# Patient Record
Sex: Female | Born: 1950 | Race: White | Hispanic: No | Marital: Married | Smoking: Former smoker
Health system: Southern US, Community
[De-identification: ages and names within clinical notes are randomized; demographics above are authoritative.]

## PROBLEM LIST (undated history)

## (undated) DIAGNOSIS — E785 Hyperlipidemia, unspecified: Secondary | ICD-10-CM

## (undated) DIAGNOSIS — I10 Essential (primary) hypertension: Secondary | ICD-10-CM

## (undated) DIAGNOSIS — N281 Cyst of kidney, acquired: Secondary | ICD-10-CM

## (undated) DIAGNOSIS — I700 Atherosclerosis of aorta: Secondary | ICD-10-CM

## (undated) DIAGNOSIS — Z9889 Other specified postprocedural states: Secondary | ICD-10-CM

## (undated) DIAGNOSIS — M545 Low back pain, unspecified: Secondary | ICD-10-CM

## (undated) DIAGNOSIS — I2510 Atherosclerotic heart disease of native coronary artery without angina pectoris: Secondary | ICD-10-CM

## (undated) DIAGNOSIS — C641 Malignant neoplasm of right kidney, except renal pelvis: Secondary | ICD-10-CM

## (undated) DIAGNOSIS — K589 Irritable bowel syndrome without diarrhea: Secondary | ICD-10-CM

## (undated) DIAGNOSIS — K469 Unspecified abdominal hernia without obstruction or gangrene: Secondary | ICD-10-CM

## (undated) DIAGNOSIS — Z87891 Personal history of nicotine dependence: Secondary | ICD-10-CM

## (undated) DIAGNOSIS — Z79891 Long term (current) use of opiate analgesic: Secondary | ICD-10-CM

## (undated) DIAGNOSIS — K4090 Unilateral inguinal hernia, without obstruction or gangrene, not specified as recurrent: Secondary | ICD-10-CM

## (undated) DIAGNOSIS — T7840XA Allergy, unspecified, initial encounter: Secondary | ICD-10-CM

## (undated) DIAGNOSIS — M419 Scoliosis, unspecified: Secondary | ICD-10-CM

## (undated) DIAGNOSIS — Z7982 Long term (current) use of aspirin: Secondary | ICD-10-CM

## (undated) DIAGNOSIS — I6523 Occlusion and stenosis of bilateral carotid arteries: Secondary | ICD-10-CM

## (undated) DIAGNOSIS — K118 Other diseases of salivary glands: Secondary | ICD-10-CM

## (undated) DIAGNOSIS — C649 Malignant neoplasm of unspecified kidney, except renal pelvis: Secondary | ICD-10-CM

## (undated) HISTORY — DX: Low back pain: M54.5

## (undated) HISTORY — DX: Unspecified abdominal hernia without obstruction or gangrene: K46.9

## (undated) HISTORY — PX: OTHER SURGICAL HISTORY: SHX169

## (undated) HISTORY — DX: Cyst of kidney, acquired: N28.1

## (undated) HISTORY — DX: Malignant neoplasm of unspecified kidney, except renal pelvis: C64.9

## (undated) HISTORY — DX: Hyperlipidemia, unspecified: E78.5

## (undated) HISTORY — DX: Low back pain, unspecified: M54.50

## (undated) HISTORY — DX: Atherosclerotic heart disease of native coronary artery without angina pectoris: I25.10

## (undated) HISTORY — PX: MULTIPLE TOOTH EXTRACTIONS: SHX2053

## (undated) HISTORY — DX: Scoliosis, unspecified: M41.9

## (undated) HISTORY — DX: Essential (primary) hypertension: I10

## (undated) HISTORY — DX: Personal history of nicotine dependence: Z87.891

## (undated) HISTORY — DX: Allergy, unspecified, initial encounter: T78.40XA

## (undated) HISTORY — PX: SHOULDER ARTHROSCOPY: SHX128

## (undated) HISTORY — PX: SEPTOPLASTY: SUR1290

## (undated) HISTORY — DX: Irritable bowel syndrome, unspecified: K58.9

---
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
## 1978-11-11 DIAGNOSIS — O009 Unspecified ectopic pregnancy without intrauterine pregnancy: Secondary | ICD-10-CM

## 1978-11-11 HISTORY — PX: SALPINGECTOMY: SHX328

## 1978-11-11 HISTORY — DX: Unspecified ectopic pregnancy without intrauterine pregnancy: O00.90

## 1979-11-12 HISTORY — PX: ABDOMINAL HYSTERECTOMY: SHX81

## 1979-11-12 HISTORY — PX: OTHER SURGICAL HISTORY: SHX169

## 1982-11-11 HISTORY — PX: OTHER SURGICAL HISTORY: SHX169

## 1982-11-11 HISTORY — PX: ABDOMINAL ADHESION SURGERY: SHX90

## 1991-11-12 HISTORY — PX: HERNIA REPAIR: SHX51

## 2007-08-18 ENCOUNTER — Ambulatory Visit: Payer: Self-pay | Admitting: Specialist

## 2007-08-20 ENCOUNTER — Ambulatory Visit: Payer: Self-pay | Admitting: Internal Medicine

## 2008-08-23 ENCOUNTER — Ambulatory Visit: Payer: Self-pay | Admitting: Internal Medicine

## 2009-08-24 ENCOUNTER — Ambulatory Visit: Payer: Self-pay | Admitting: Internal Medicine

## 2009-09-07 ENCOUNTER — Ambulatory Visit: Payer: Self-pay | Admitting: Internal Medicine

## 2010-01-10 LAB — HM PAP SMEAR: HM Pap smear: NEGATIVE

## 2010-09-20 ENCOUNTER — Ambulatory Visit: Payer: Self-pay | Admitting: Internal Medicine

## 2011-08-26 ENCOUNTER — Other Ambulatory Visit: Payer: Self-pay | Admitting: *Deleted

## 2011-08-26 NOTE — Telephone Encounter (Signed)
Received faxed refill request from pharmacy for Vicodin 5-500, #120, take one tablet by mouth 4 times daily as needed for pain,  Last fill 09/26/10. Is it okay to refill medication?

## 2011-08-26 NOTE — Telephone Encounter (Signed)
Yes ok to refill #120.  The fax I received was changed by hand to 390 tablets, which I rejected.

## 2011-08-27 ENCOUNTER — Ambulatory Visit (INDEPENDENT_AMBULATORY_CARE_PROVIDER_SITE_OTHER): Payer: Medicare Other | Admitting: *Deleted

## 2011-08-27 DIAGNOSIS — Z23 Encounter for immunization: Secondary | ICD-10-CM

## 2011-08-27 MED ORDER — HYDROCODONE-ACETAMINOPHEN 5-500 MG PO TABS: 1.0000 | ORAL_TABLET | Freq: Four times a day (QID) | ORAL | Status: DC | PRN

## 2011-08-27 NOTE — Telephone Encounter (Signed)
Called in. Patient informed

## 2011-08-29 ENCOUNTER — Telehealth: Payer: Self-pay | Admitting: Internal Medicine

## 2011-08-29 DIAGNOSIS — Z1239 Encounter for other screening for malignant neoplasm of breast: Secondary | ICD-10-CM

## 2011-08-29 NOTE — Telephone Encounter (Signed)
Patient would like screening mammo ordered, please contact patient at home or cell [phone]

## 2011-08-30 NOTE — Telephone Encounter (Signed)
Patient scheduled [facility] 09/16/11 8:30, faxed order, patient aware

## 2011-08-30 NOTE — Telephone Encounter (Signed)
Mammogram ordered in EPIC.  Please arrange

## 2011-09-13 LAB — HM MAMMOGRAPHY: HM Mammogram: NORMAL

## 2011-09-19 ENCOUNTER — Other Ambulatory Visit: Payer: Self-pay | Admitting: Internal Medicine

## 2011-09-20 ENCOUNTER — Telehealth: Payer: Self-pay | Admitting: Internal Medicine

## 2011-09-20 DIAGNOSIS — M549 Dorsalgia, unspecified: Secondary | ICD-10-CM

## 2011-09-20 NOTE — Telephone Encounter (Signed)
[phone] home [phone]  Pt called to check on her rx vicocin &  robaxon muscle relaxer (this is the one she lost in [place]. Pt stated that drug store has faxed request)  [facility] [address]

## 2011-09-20 NOTE — Telephone Encounter (Signed)
Ok to call in refill on vicodin  5/500 one tablet every 6 hours prn apin  #120/month with 3 refills.

## 2011-09-23 ENCOUNTER — Telehealth: Payer: Self-pay | Admitting: Internal Medicine

## 2011-09-23 NOTE — Telephone Encounter (Signed)
I have called in the Rx for Vicodin because I saw where Dr. Derrel Nip has approved it.  Dr. Derrel Nip is it ok to call in the Robaxin Rx and if so, what is the dose, qty, and refill.  Please advise.

## 2011-09-24 MED ORDER — METHOCARBAMOL 750 MG PO TABS: 750.0000 mg | ORAL_TABLET | Freq: Three times a day (TID) | ORAL | Status: DC

## 2011-09-24 NOTE — Telephone Encounter (Signed)
[facility]in [facility]

## 2011-09-24 NOTE — Telephone Encounter (Signed)
Patient notified

## 2011-10-04 ENCOUNTER — Ambulatory Visit (INDEPENDENT_AMBULATORY_CARE_PROVIDER_SITE_OTHER): Payer: Medicare HMO | Admitting: Internal Medicine

## 2011-10-04 ENCOUNTER — Telehealth: Payer: Self-pay | Admitting: *Deleted

## 2011-10-04 ENCOUNTER — Encounter: Payer: Self-pay | Admitting: Internal Medicine

## 2011-10-04 DIAGNOSIS — E785 Hyperlipidemia, unspecified: Secondary | ICD-10-CM

## 2011-10-04 DIAGNOSIS — Z13820 Encounter for screening for osteoporosis: Secondary | ICD-10-CM

## 2011-10-04 DIAGNOSIS — G8929 Other chronic pain: Secondary | ICD-10-CM

## 2011-10-04 DIAGNOSIS — J449 Chronic obstructive pulmonary disease, unspecified: Secondary | ICD-10-CM

## 2011-10-04 DIAGNOSIS — Z1211 Encounter for screening for malignant neoplasm of colon: Secondary | ICD-10-CM

## 2011-10-04 DIAGNOSIS — Z13220 Encounter for screening for lipoid disorders: Secondary | ICD-10-CM

## 2011-10-04 DIAGNOSIS — M549 Dorsalgia, unspecified: Secondary | ICD-10-CM

## 2011-10-04 DIAGNOSIS — R5383 Other fatigue: Secondary | ICD-10-CM

## 2011-10-04 DIAGNOSIS — Z0000 Encounter for general adult medical examination without abnormal findings: Secondary | ICD-10-CM

## 2011-10-04 DIAGNOSIS — Z1239 Encounter for other screening for malignant neoplasm of breast: Secondary | ICD-10-CM | POA: Insufficient documentation

## 2011-10-04 DIAGNOSIS — Z124 Encounter for screening for malignant neoplasm of cervix: Secondary | ICD-10-CM

## 2011-10-04 MED ORDER — METHOCARBAMOL 750 MG PO TABS: 750.0000 mg | ORAL_TABLET | Freq: Four times a day (QID) | ORAL | Status: AC

## 2011-10-04 MED ORDER — HYDROCODONE-ACETAMINOPHEN 10-500 MG PO TABS: 1.0000 | ORAL_TABLET | Freq: Four times a day (QID) | ORAL | Status: DC | PRN

## 2011-10-04 MED ORDER — HYDROCODONE-ACETAMINOPHEN 10-650 MG PO TABS: 1.0000 | ORAL_TABLET | Freq: Four times a day (QID) | ORAL | Status: DC | PRN

## 2011-10-04 MED ORDER — ALBUTEROL SULFATE HFA 108 (90 BASE) MCG/ACT IN AERS: 2.0000 | INHALATION_SPRAY | Freq: Four times a day (QID) | RESPIRATORY_TRACT | Status: DC | PRN

## 2011-10-04 MED ORDER — MELOXICAM 15 MG PO TABS: 15.0000 mg | ORAL_TABLET | Freq: Every day | ORAL | Status: DC

## 2011-10-04 NOTE — Telephone Encounter (Signed)
What labs would you like for me to draw on Monday when she comes in?

## 2011-10-04 NOTE — Assessment & Plan Note (Signed)
Normal Nov 2012.

## 2011-10-04 NOTE — Patient Instructions (Signed)
I have increased the hydrcodone in your vicodin to 10 mg ,  You should not take more than 4 daily because of the tylenol limit.  We will call  you with the bone density appt and the gastroenterology referral.   Come back at 8:10 Monday for fasting labs

## 2011-10-05 ENCOUNTER — Encounter: Payer: Self-pay | Admitting: Internal Medicine

## 2011-10-05 DIAGNOSIS — M5416 Radiculopathy, lumbar region: Secondary | ICD-10-CM | POA: Insufficient documentation

## 2011-10-05 DIAGNOSIS — M545 Low back pain: Secondary | ICD-10-CM | POA: Insufficient documentation

## 2011-10-05 DIAGNOSIS — K469 Unspecified abdominal hernia without obstruction or gangrene: Secondary | ICD-10-CM | POA: Insufficient documentation

## 2011-10-05 DIAGNOSIS — K589 Irritable bowel syndrome without diarrhea: Secondary | ICD-10-CM | POA: Insufficient documentation

## 2011-10-05 DIAGNOSIS — Z85528 Personal history of other malignant neoplasm of kidney: Secondary | ICD-10-CM | POA: Insufficient documentation

## 2011-10-05 DIAGNOSIS — M419 Scoliosis, unspecified: Secondary | ICD-10-CM | POA: Insufficient documentation

## 2011-10-05 DIAGNOSIS — J449 Chronic obstructive pulmonary disease, unspecified: Secondary | ICD-10-CM | POA: Insufficient documentation

## 2011-10-05 DIAGNOSIS — J4489 Other specified chronic obstructive pulmonary disease: Secondary | ICD-10-CM | POA: Insufficient documentation

## 2011-10-05 DIAGNOSIS — I10 Essential (primary) hypertension: Secondary | ICD-10-CM | POA: Insufficient documentation

## 2011-10-05 NOTE — Assessment & Plan Note (Addendum)
Her pain had been controlled for 20 years on darvocet, which was discontinued only because it is no longer available. She had no relief with tramadol/robaxin trial in Jan 2011 and was prescribed  vicodin instead.  Has been averaging 5 daily with inadequate pain control, no neurologic symptoms to suggest spinal stenosis.  Discussed her need to limit tylenol to 2 grams daily.  Would like to avoid NSAIDS given her history of nephrectomy.. Will change vicodin to 10/500 and allow maximum of 4 daily

## 2011-10-05 NOTE — Progress Notes (Signed)
  Subjective:     Suzanne Cole is a 60 y.o. female and is here for a comprehensive physical exam. The patient reports problems - chronic back pain not controlled with current medication regimen.  History   Social History  . Marital Status: Married    Spouse Name: N/A    Number of Children: N/A  . Years of Education: N/A   Occupational History  . Not on file.   Social History Main Topics  . Smoking status: Never Smoker   . Smokeless tobacco: Never Used  . Alcohol Use: Yes  . Drug Use: No  . Sexually Active: Not on file   Other Topics Concern  . Not on file   Social History Narrative  . No narrative on file   Health Maintenance  Topic Date Due  . Pap Smear  08/17/1969  . Tetanus/tdap  08/17/1970  . Mammogram  08/17/2001  . Colonoscopy  08/17/2001  . Zostavax  08/18/2011  . Influenza Vaccine  08/11/2012    The following portions of the patient's history were reviewed and updated as appropriate: allergies, current medications, past family history, past medical history, past social history, past surgical history and problem list.  Review of Systems A comprehensive review of systems was negative.   Objective:    BP 132/70  Pulse 65  Temp(Src) 98.2 F (36.8 C) (Oral)  Resp 16  Ht 4\' 11"  (1.499 m)  Wt 129 lb 12 oz (58.854 kg)  BMI 26.21 kg/m2  SpO2 98% General appearance: alert, cooperative and appears stated age Head: Normocephalic, without obvious abnormality, atraumatic Ears: normal TM's and external ear canals both ears Throat: lips, mucosa, and tongue normal; teeth and gums normal Neck: no adenopathy, no carotid bruit, no JVD, supple, symmetrical, trachea midline and thyroid not enlarged, symmetric, no tenderness/mass/nodules Back: severe scoliosis, surgical scars present Lungs: clear to auscultation bilaterally Breasts: normal appearance, no masses or tenderness Heart: regular rate and rhythm, S1, S2 normal, no murmur, click, rub or gallop Abdomen: soft,  non-tender; bowel sounds normal; no masses,  no organomegaly Extremities: extremities normal, atraumatic, no cyanosis or edema Pulses: 2+ and symmetric Skin: Skin color, texture, turgor normal. No rashes or lesions Lymph nodes: Cervical, supraclavicular, and axillary nodes normal. Neurologic: Alert and oriented X 3, normal strength and tone. Normal symmetric reflexes. Normal coordination and gait    Assessment:    Healthy female exam, with chronic spinal deformities     Plan:     See After Visit Summary for Counseling Recommendations

## 2011-10-05 NOTE — Assessment & Plan Note (Signed)
She is s/p hysterectomy, with normal PAP  March 2011.  Deferred today.

## 2011-10-07 ENCOUNTER — Other Ambulatory Visit (INDEPENDENT_AMBULATORY_CARE_PROVIDER_SITE_OTHER): Payer: Medicare HMO | Admitting: *Deleted

## 2011-10-07 ENCOUNTER — Encounter: Payer: Self-pay | Admitting: *Deleted

## 2011-10-07 DIAGNOSIS — Z1211 Encounter for screening for malignant neoplasm of colon: Secondary | ICD-10-CM

## 2011-10-07 DIAGNOSIS — R5383 Other fatigue: Secondary | ICD-10-CM

## 2011-10-07 DIAGNOSIS — E785 Hyperlipidemia, unspecified: Secondary | ICD-10-CM

## 2011-10-07 DIAGNOSIS — R5381 Other malaise: Secondary | ICD-10-CM

## 2011-10-07 LAB — LDL CHOLESTEROL, DIRECT: Direct LDL: 128.2 mg/dL

## 2011-10-07 LAB — COMPREHENSIVE METABOLIC PANEL
ALT: 20 U/L (ref 0–35)
AST: 19 U/L (ref 0–37)
Albumin: 4.1 g/dL (ref 3.5–5.2)
CO2: 24 mEq/L (ref 19–32)
Calcium: 9.5 mg/dL (ref 8.4–10.5)
Chloride: 108 mEq/L (ref 96–112)
GFR: 62.23 mL/min (ref >60.00)
Potassium: 4.7 mEq/L (ref 3.5–5.1)
Total Protein: 7.8 g/dL (ref 6.0–8.3)

## 2011-10-07 LAB — CBC WITH DIFFERENTIAL/PLATELET
Basophils Relative: 0.9 % (ref 0.0–3.0)
Eosinophils Relative: 2.4 % (ref 0.0–5.0)
Lymphocytes Relative: 27.9 % (ref 12.0–46.0)
Monocytes Absolute: 0.4 10*3/uL (ref 0.1–1.0)
Neutrophils Relative %: 63.3 % (ref 43.0–77.0)
Platelets: 299 10*3/uL (ref 150.0–400.0)
RBC: 4.41 Mil/uL (ref 3.87–5.11)
WBC: 6.6 10*3/uL (ref 4.5–10.5)

## 2011-10-07 LAB — LIPID PANEL
Total CHOL/HDL Ratio: 5
VLDL: 51.6 mg/dL — ABNORMAL HIGH (ref 0.0–40.0)

## 2011-10-07 LAB — TSH: TSH: 0.8 u[IU]/mL (ref 0.35–5.50)

## 2011-10-08 NOTE — Progress Notes (Signed)
Addended by: Deborra Medina on: 10/08/2011 12:03 PM   Modules accepted: Orders

## 2011-10-09 ENCOUNTER — Encounter: Payer: Self-pay | Admitting: Internal Medicine

## 2011-10-21 ENCOUNTER — Encounter: Payer: Self-pay | Admitting: *Deleted

## 2011-10-24 ENCOUNTER — Other Ambulatory Visit: Payer: Medicare HMO

## 2011-10-24 DIAGNOSIS — Z1211 Encounter for screening for malignant neoplasm of colon: Secondary | ICD-10-CM

## 2011-10-24 LAB — FECAL OCCULT BLOOD, IMMUNOCHEMICAL: Fecal Occult Bld: NEGATIVE

## 2011-10-30 ENCOUNTER — Telehealth: Payer: Self-pay | Admitting: Internal Medicine

## 2011-10-30 MED ORDER — OMEPRAZOLE 20 MG PO CPDR: 20.0000 mg | DELAYED_RELEASE_CAPSULE | Freq: Every day | ORAL | Status: DC

## 2011-10-30 NOTE — Telephone Encounter (Signed)
[id number] PT CALLED TO SEE IF YOU RECEIVED RX FROM [facility] PRILOSEC (GENERIC)   [facility] [phone] FAX SHE WOULD LIKE TO GET THIS BEFORE THE END OF THE YEAR

## 2011-10-30 NOTE — Telephone Encounter (Signed)
Refill authorized.  Epic updated

## 2011-10-30 NOTE — Telephone Encounter (Signed)
Ok to refill 

## 2011-11-01 ENCOUNTER — Telehealth: Payer: Self-pay | Admitting: *Deleted

## 2011-11-01 MED ORDER — OMEPRAZOLE 20 MG PO CPDR: 20.0000 mg | DELAYED_RELEASE_CAPSULE | Freq: Every day | ORAL | Status: DC

## 2011-11-01 NOTE — Telephone Encounter (Signed)
Patient is calling because her prescription was to go to [facility] not [facility].  Rx sent. Patient is aware.

## 2011-11-04 ENCOUNTER — Other Ambulatory Visit: Payer: Self-pay | Admitting: *Deleted

## 2011-11-04 MED ORDER — OMEPRAZOLE 20 MG PO CPDR: 20.0000 mg | DELAYED_RELEASE_CAPSULE | Freq: Every day | ORAL | Status: DC

## 2011-11-07 ENCOUNTER — Other Ambulatory Visit: Payer: Self-pay | Admitting: *Deleted

## 2011-11-07 MED ORDER — OMEPRAZOLE 40 MG PO CPDR: 40.0000 mg | DELAYED_RELEASE_CAPSULE | Freq: Every day | ORAL | Status: DC

## 2011-11-07 NOTE — Telephone Encounter (Signed)
Patient called and said omeprazole 20 mg was sent to [facility] and it should've been 40 mg. Confirmed in chart and corrected with pharmacy.

## 2011-11-07 NOTE — Telephone Encounter (Signed)
Patient called and stated her omeprazole was the wrong strength. She has been on 40 mg and 20 mg had been sent in. Confirmed in system and changed. Resent 40 mg to [facility].

## 2011-12-17 ENCOUNTER — Encounter: Payer: Self-pay | Admitting: Internal Medicine

## 2012-01-22 ENCOUNTER — Other Ambulatory Visit: Payer: Self-pay | Admitting: Internal Medicine

## 2012-01-24 ENCOUNTER — Other Ambulatory Visit: Payer: Self-pay | Admitting: Internal Medicine

## 2012-01-26 NOTE — Telephone Encounter (Signed)
Ok to refill Suzanne Cole vicodin.

## 2012-02-03 ENCOUNTER — Telehealth: Payer: Self-pay | Admitting: Internal Medicine

## 2012-02-03 MED ORDER — OMEPRAZOLE 40 MG PO CPDR: 40.0000 mg | DELAYED_RELEASE_CAPSULE | Freq: Every day | ORAL | Status: DC

## 2012-02-03 NOTE — Telephone Encounter (Signed)
[phone] Pt needs refill on omeprazole Pt would like to pick up with  Husband rx

## 2012-03-16 ENCOUNTER — Telehealth: Payer: Self-pay | Admitting: Internal Medicine
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
## 2012-03-17 NOTE — Telephone Encounter (Signed)
Yes, letter printed and patient notified.

## 2012-03-17 NOTE — Telephone Encounter (Signed)
Did you get the leltter i printed?

## 2012-05-12 ENCOUNTER — Other Ambulatory Visit: Payer: Self-pay | Admitting: Internal Medicine

## 2012-06-15 ENCOUNTER — Other Ambulatory Visit: Payer: Self-pay | Admitting: Internal Medicine

## 2012-06-15 NOTE — Telephone Encounter (Signed)
rx phoned in

## 2012-06-16 NOTE — Telephone Encounter (Signed)
DONE

## 2012-08-25 ENCOUNTER — Ambulatory Visit (INDEPENDENT_AMBULATORY_CARE_PROVIDER_SITE_OTHER): Payer: Medicare Other | Admitting: Internal Medicine

## 2012-08-25 DIAGNOSIS — Z23 Encounter for immunization: Secondary | ICD-10-CM

## 2012-08-28 ENCOUNTER — Other Ambulatory Visit: Payer: Self-pay | Admitting: Internal Medicine

## 2012-08-28 NOTE — Telephone Encounter (Signed)
Refill request for Hydrocodone Acetaminophen 5-500 mg. Ok to refill?

## 2012-08-29 NOTE — Telephone Encounter (Signed)
Refill for 30 days only,  #120  patient must be seen for an office visit (has not been seen in 11 months) prior to additional refills

## 2012-08-31 NOTE — Telephone Encounter (Signed)
Hydrocodone- Acetaminophen 5-500 mg called in to [facility] #120 0 R.

## 2012-10-05 ENCOUNTER — Other Ambulatory Visit: Payer: Self-pay | Admitting: Internal Medicine

## 2012-10-06 NOTE — Telephone Encounter (Signed)
No, she has not been seen in over a year and her last refill was given with the condition that she be seen prior to any more refills.

## 2012-10-06 NOTE — Telephone Encounter (Signed)
Left message on patient vm to call and schedule appt for follow up.

## 2012-10-07 ENCOUNTER — Telehealth: Payer: Self-pay | Admitting: Internal Medicine

## 2012-10-07 MED ORDER — HYDROCODONE-ACETAMINOPHEN 5-500 MG PO TABS: 1.0000 | ORAL_TABLET | Freq: Four times a day (QID) | ORAL | Status: DC | PRN

## 2012-10-07 NOTE — Telephone Encounter (Signed)
Error

## 2012-10-07 NOTE — Telephone Encounter (Signed)
Refill on vicodin #120 no refills authorized in epic

## 2012-10-07 NOTE — Telephone Encounter (Signed)
Pt scheduled f/u appointment in early December. She was wondering if she could have that filled.

## 2012-10-07 NOTE — Telephone Encounter (Signed)
Vicodin 5-500 mg faxed to [facility]

## 2012-10-07 NOTE — Telephone Encounter (Signed)
I talked with pt and let her know that she needs to see Dr. Derrel Nip be fore meds can be given. I scheduled her for Monday 10/12/12. She was wondering if there was anything that she could take until then ???

## 2012-10-12 ENCOUNTER — Other Ambulatory Visit: Payer: Self-pay

## 2012-10-12 ENCOUNTER — Ambulatory Visit (INDEPENDENT_AMBULATORY_CARE_PROVIDER_SITE_OTHER): Payer: Medicare Other | Admitting: Internal Medicine

## 2012-10-12 ENCOUNTER — Encounter: Payer: Self-pay | Admitting: Internal Medicine

## 2012-10-12 VITALS — BP 138/72 | HR 62 | Temp 97.6°F | Resp 12 | Ht <= 58 in | Wt 125.0 lb

## 2012-10-12 DIAGNOSIS — Z1211 Encounter for screening for malignant neoplasm of colon: Secondary | ICD-10-CM

## 2012-10-12 DIAGNOSIS — R590 Localized enlarged lymph nodes: Secondary | ICD-10-CM | POA: Insufficient documentation

## 2012-10-12 DIAGNOSIS — J309 Allergic rhinitis, unspecified: Secondary | ICD-10-CM

## 2012-10-12 DIAGNOSIS — Z79899 Other long term (current) drug therapy: Secondary | ICD-10-CM

## 2012-10-12 DIAGNOSIS — R599 Enlarged lymph nodes, unspecified: Secondary | ICD-10-CM

## 2012-10-12 DIAGNOSIS — Z720 Tobacco use: Secondary | ICD-10-CM

## 2012-10-12 DIAGNOSIS — M549 Dorsalgia, unspecified: Secondary | ICD-10-CM

## 2012-10-12 DIAGNOSIS — G8929 Other chronic pain: Secondary | ICD-10-CM

## 2012-10-12 DIAGNOSIS — F17200 Nicotine dependence, unspecified, uncomplicated: Secondary | ICD-10-CM

## 2012-10-12 DIAGNOSIS — Z1239 Encounter for other screening for malignant neoplasm of breast: Secondary | ICD-10-CM

## 2012-10-12 DIAGNOSIS — J449 Chronic obstructive pulmonary disease, unspecified: Secondary | ICD-10-CM

## 2012-10-12 MED ORDER — FLUTICASONE PROPIONATE 50 MCG/ACT NA SUSP: 2.0000 | Freq: Every day | NASAL | Status: DC

## 2012-10-12 MED ORDER — HYDROCODONE-ACETAMINOPHEN 5-325 MG PO TABS: 1.0000 | ORAL_TABLET | Freq: Four times a day (QID) | ORAL | Status: DC | PRN

## 2012-10-12 MED ORDER — METHOCARBAMOL 750 MG PO TABS: 750.0000 mg | ORAL_TABLET | Freq: Four times a day (QID) | ORAL | Status: DC

## 2012-10-12 NOTE — Assessment & Plan Note (Addendum)
Present for the past 2 weeks on the right. Swelling noted by husband,  Not particularly tender.  Will repeat assessment at her annual physical and if still present will need referral to ENT for biopsy given history of lifelong tobacco abuse.

## 2012-10-12 NOTE — Patient Instructions (Addendum)
Please call me if the lymph node is still swollen in a month   i am substituting flonase nasal spray for veramys because it is generic
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
## 2012-10-12 NOTE — Assessment & Plan Note (Signed)
Will refer to [facility] for capsule colonography in January

## 2012-10-12 NOTE — Assessment & Plan Note (Signed)
Mammogram due, and ordered today

## 2012-10-13 LAB — COMPREHENSIVE METABOLIC PANEL
ALT: 17 U/L (ref 0–35)
AST: 21 U/L (ref 0–37)
Alkaline Phosphatase: 59 U/L (ref 39–117)
Calcium: 9.2 mg/dL (ref 8.4–10.5)
Chloride: 105 mEq/L (ref 96–112)
Creatinine, Ser: 0.9 mg/dL (ref 0.4–1.2)
Total Bilirubin: 0.7 mg/dL (ref 0.3–1.2)

## 2012-10-14 ENCOUNTER — Encounter: Payer: Self-pay | Admitting: Internal Medicine

## 2012-10-14 ENCOUNTER — Telehealth: Payer: Self-pay | Admitting: Internal Medicine

## 2012-10-14 DIAGNOSIS — Z87891 Personal history of nicotine dependence: Secondary | ICD-10-CM

## 2012-10-14 DIAGNOSIS — R599 Enlarged lymph nodes, unspecified: Secondary | ICD-10-CM

## 2012-10-14 DIAGNOSIS — J309 Allergic rhinitis, unspecified: Secondary | ICD-10-CM | POA: Insufficient documentation

## 2012-10-14 NOTE — Assessment & Plan Note (Addendum)
Secondary tohistory of  tobacco abuse.  She quit last  year and does not have daily symptoms or  use daily inhalers.

## 2012-10-14 NOTE — Telephone Encounter (Signed)
i would like her to get a chest x ray at the [facility] office  Reason: because of her enlarged lymph node on her neck and her history of tobacco use. It has been ordered in EPIC .

## 2012-10-14 NOTE — Assessment & Plan Note (Addendum)
She has deferred epidural steroid injection offered by Dr. Marry Guan and has deferred PT> I have reminded her that in order to refill her monthly request for vicodin she has to be seen every 3 month.  She will be asked to sign a chronic narcotics contract and submit to random urine drug screens

## 2012-10-14 NOTE — Assessment & Plan Note (Signed)
Improved with veramyst sample. Trial of generic flonase

## 2012-10-14 NOTE — Telephone Encounter (Signed)
Left message on patient vm with detailed instructions advising patient to go to [facility] office to get chest xray done.

## 2012-10-16 ENCOUNTER — Ambulatory Visit (INDEPENDENT_AMBULATORY_CARE_PROVIDER_SITE_OTHER)
Admission: RE | Admit: 2012-10-16 | Discharge: 2012-10-16 | Disposition: A | Discharge status: Home / Self Care | Payer: Medicare Other | Source: Ambulatory Visit | Attending: Internal Medicine | Admitting: Internal Medicine

## 2012-10-16 DIAGNOSIS — R599 Enlarged lymph nodes, unspecified: Secondary | ICD-10-CM

## 2012-10-16 DIAGNOSIS — Z87891 Personal history of nicotine dependence: Secondary | ICD-10-CM

## 2012-10-18 ENCOUNTER — Encounter: Payer: Self-pay | Admitting: Internal Medicine

## 2012-10-18 ENCOUNTER — Telehealth: Payer: Self-pay | Admitting: Internal Medicine

## 2012-10-18 NOTE — Telephone Encounter (Signed)
DEXA scan indicates osteoporosis .  I am recommending therapy we will discuss at her next visit, .  For now she should be taking  1200 mg ofcalcium and 1000 units of Vit D3 available otc.

## 2012-10-19 ENCOUNTER — Ambulatory Visit: Payer: Medicare Other | Admitting: Internal Medicine

## 2012-10-19 NOTE — Telephone Encounter (Signed)
Left detailed message on patient vm with instructions as directed.

## 2012-11-03 ENCOUNTER — Encounter: Payer: Self-pay | Admitting: Internal Medicine

## 2012-11-05 ENCOUNTER — Encounter: Payer: Self-pay | Admitting: Internal Medicine

## 2012-11-12 ENCOUNTER — Other Ambulatory Visit: Payer: Self-pay | Admitting: Internal Medicine

## 2012-11-12 NOTE — Telephone Encounter (Signed)
Requesting refill for HYDROcodone-acetaminophen (NORCO/VICODIN) 5-325 MG per tablet  Please advise

## 2012-11-13 ENCOUNTER — Other Ambulatory Visit: Payer: Self-pay | Admitting: Internal Medicine

## 2012-11-13 MED ORDER — HYDROCODONE-ACETAMINOPHEN 5-325 MG PO TABS: 1.0000 | ORAL_TABLET | Freq: Four times a day (QID) | ORAL | Status: DC | PRN

## 2012-11-13 NOTE — Telephone Encounter (Signed)
Patient wants to know why her hydrocodone was sent back to the pharmacy  with a refusal to fill.

## 2012-11-13 NOTE — Addendum Note (Signed)
Addended by: [name] on: 11/13/2012 01:32 PM   Modules accepted: Orders

## 2012-11-13 NOTE — Telephone Encounter (Signed)
Ok to refill,  Authorized in epic 

## 2012-11-13 NOTE — Telephone Encounter (Signed)
Med refill for hydrocodone acetaminophen called to [facility] pharmacy. Left on doctor pharmacy line. Patient notified.

## 2012-11-16 ENCOUNTER — Other Ambulatory Visit: Payer: Self-pay | Admitting: Internal Medicine

## 2012-11-16 NOTE — Telephone Encounter (Signed)
Ok to refill 

## 2013-02-03 ENCOUNTER — Encounter: Payer: Medicare Other | Admitting: Internal Medicine

## 2013-05-24 ENCOUNTER — Other Ambulatory Visit: Payer: Self-pay | Admitting: *Deleted

## 2013-05-24 NOTE — Telephone Encounter (Signed)
Has not been seen since 10/2012, ok to refill?

## 2013-05-25 ENCOUNTER — Ambulatory Visit (INDEPENDENT_AMBULATORY_CARE_PROVIDER_SITE_OTHER): Payer: Medicare HMO | Admitting: Internal Medicine

## 2013-05-25 ENCOUNTER — Encounter: Payer: Self-pay | Admitting: Internal Medicine

## 2013-05-25 VITALS — BP 148/76 | HR 70 | Temp 98.0°F | Resp 16 | Ht 59.0 in | Wt 123.0 lb

## 2013-05-25 DIAGNOSIS — Z13220 Encounter for screening for lipoid disorders: Secondary | ICD-10-CM

## 2013-05-25 DIAGNOSIS — M549 Dorsalgia, unspecified: Secondary | ICD-10-CM

## 2013-05-25 DIAGNOSIS — E559 Vitamin D deficiency, unspecified: Secondary | ICD-10-CM

## 2013-05-25 DIAGNOSIS — R5381 Other malaise: Secondary | ICD-10-CM

## 2013-05-25 DIAGNOSIS — E785 Hyperlipidemia, unspecified: Secondary | ICD-10-CM

## 2013-05-25 DIAGNOSIS — Z1211 Encounter for screening for malignant neoplasm of colon: Secondary | ICD-10-CM

## 2013-05-25 DIAGNOSIS — M7989 Other specified soft tissue disorders: Secondary | ICD-10-CM | POA: Insufficient documentation

## 2013-05-25 DIAGNOSIS — Z1159 Encounter for screening for other viral diseases: Secondary | ICD-10-CM

## 2013-05-25 DIAGNOSIS — R5383 Other fatigue: Secondary | ICD-10-CM

## 2013-05-25 DIAGNOSIS — L989 Disorder of the skin and subcutaneous tissue, unspecified: Secondary | ICD-10-CM

## 2013-05-25 DIAGNOSIS — G8929 Other chronic pain: Secondary | ICD-10-CM

## 2013-05-25 DIAGNOSIS — J309 Allergic rhinitis, unspecified: Secondary | ICD-10-CM

## 2013-05-25 DIAGNOSIS — Z0000 Encounter for general adult medical examination without abnormal findings: Secondary | ICD-10-CM

## 2013-05-25 LAB — CBC WITH DIFFERENTIAL/PLATELET
Basophils Relative: 0.8 % (ref 0.0–3.0)
Eosinophils Absolute: 0.1 10*3/uL (ref 0.0–0.7)
Lymphocytes Relative: 23.9 % (ref 12.0–46.0)
MCHC: 34.2 g/dL (ref 30.0–36.0)
Neutrophils Relative %: 66.8 % (ref 43.0–77.0)
Platelets: 346 10*3/uL (ref 150.0–400.0)
RBC: 4.68 Mil/uL (ref 3.87–5.11)
WBC: 9.4 10*3/uL (ref 4.5–10.5)

## 2013-05-25 LAB — COMPREHENSIVE METABOLIC PANEL
ALT: 16 U/L (ref 0–35)
Alkaline Phosphatase: 59 U/L (ref 39–117)
Calcium: 9.8 mg/dL (ref 8.4–10.5)
GFR: 63.4 mL/min (ref >60.00)
Potassium: 4.3 mEq/L (ref 3.5–5.1)
Sodium: 138 mEq/L (ref 135–145)
Total Bilirubin: 0.7 mg/dL (ref 0.3–1.2)

## 2013-05-25 LAB — LIPID PANEL
Cholesterol: 243 mg/dL — ABNORMAL HIGH (ref 0–200)
HDL: 46.1 mg/dL (ref >39.00)
Triglycerides: 277 mg/dL — ABNORMAL HIGH (ref 0.0–149.0)

## 2013-05-25 LAB — TSH: TSH: 1.18 u[IU]/mL (ref 0.35–5.50)

## 2013-05-25 MED ORDER — METHOCARBAMOL 750 MG PO TABS: 750.0000 mg | ORAL_TABLET | Freq: Four times a day (QID) | ORAL | Status: DC

## 2013-05-25 NOTE — Assessment & Plan Note (Signed)
She has prominent veins on left arm and has noted a dynamic lump below the antecubital fossa that os enlarged with flexion of forearm.  Remote history of PICC line or similar IV as a child when she underwent chemotherapy

## 2013-05-25 NOTE — Progress Notes (Signed)
Patient ID: Suzanne Cole, female   DOB: 1951/02/28, 62 y.o.   MRN: [id number]    Subjective:     Suzanne Cole is a 62 y.o. female and is here for a comprehensive physical exam. The patient reports problems - with worsening back pain. She has had increased back pain for the last 6 months and has had to start using her cane after 2 pm. 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  Prior MRI of thoracic spine showed degenerative changes at multiple levels.  She does not want to go to a pain clinic or PT.  2) left arm lumps  on brought on by flexing the arm at the elbow,  Resolves with cool packs and heat., but recurs daily and is getting worse.  antecubital area.     History   Social History  . Marital Status: Married    Spouse Name: N/A    Number of Children: N/A  . Years of Education: N/A   Occupational History  . Not on file.   Social History Main Topics  . Smoking status: Former Smoker    Types: Cigars  . Smokeless tobacco: Never Used  . Alcohol Use: No  . Drug Use: No  . Sexually Active: Not on file   Other Topics Concern  . Not on file   Social History Narrative  . No narrative on file   Health Maintenance  Topic Date Due  . Tetanus/tdap  08/17/1970  . Zostavax  08/18/2011  . Influenza Vaccine  07/12/2013  . Pap Smear  10/03/2014  . Mammogram  10/13/2014  . Colonoscopy  10/14/2014    The following portions of the patient's history were reviewed and updated as appropriate: allergies, current medications, past family history, past medical history, past social history, past surgical history and problem list.  Review of Systems A comprehensive review of systems was negative.   Objective:  BP 148/76  Pulse 70  Temp(Src) 98 F (36.7 C) (Oral)  Resp 16  Ht 4\' 11"  (1.499 m)  Wt 123 lb (55.792 kg)  BMI 24.83  kg/m2  SpO2 99%  General Appearance:    Alert, cooperative, no distress, appears stated age  Head:    Normocephalic, without obvious abnormality, atraumatic  Eyes:    PERRL, conjunctiva/corneas clear, EOM's intact, fundi    benign, both eyes  Ears:    Normal TM's and external ear canals, both ears  Nose:   Nares normal, septum midline, mucosa normal, no drainage    or sinus tenderness  Throat:   Lips, mucosa, and tongue normal; teeth and gums normal  Neck:   Supple, symmetrical, trachea midline, no adenopathy;    thyroid:  no enlargement/tenderness/nodules; no carotid   bruit or JVD  Back:     Scoliotic, restricted ROM, no CVA tenderness  Lungs:     Clear to auscultation bilaterally, respirations unlabored  Chest Wall:    No tenderness or deformity   Heart:    Regular rate and rhythm, S1 and S2 normal, no murmur, rub   or gallop  Breast Exam:    No tenderness, masses, or nipple abnormality  Abdomen:     Soft, non-tender, bowel sounds active all four quadrants,    no masses, no organomegaly  Genitalia:    Pelvic: cervix and uterus surgically absent.  Vaginal vault stenotic  and pale.  rectovaginal septum normal,   Extremities:   Extremities normal, atraumatic, no cyanosis or edema  Pulses:   2+ and symmetric all extremities  Skin:   Excessive sun exposure. no rashes or lesions  Lymph nodes:   Cervical, supraclavicular, and axillary nodes normal  Neurologic:   CNII-XII intact, normal strength, sensation and reflexes    throughout        Assessment:   Chronic back pain greater than 3 months duration Aggravated by scoliosis and loss of muscle tone. Narcotic dependent.  Referral to Dr. Loistine Chance for evaluation of alternative modalities for pain control.   Routine general medical examination at a health care facility Annual comprehensive exam was done including breast and pelvic exam was done . All screenings have been addressed .   Swelling of arm She has prominent veins on left arm  and has noted a dynamic lump below the antecubital fossa that os enlarged with flexion of forearm.  Remote history of PICC line or similar IV as a child when she underwent chemotherapy  Skin lesion of back She has excessive sun exposure and a nonhealing skin lesion on the left posterior shoulder.  Referral to Dermatology for evaluation    Updated Medication List Outpatient Encounter Prescriptions as of 05/25/2013  Medication Sig Dispense Refill  . albuterol (PROVENTIL HFA;VENTOLIN HFA) 108 (90 BASE) MCG/ACT inhaler Inhale 2 puffs into the lungs every 6 (six) hours as needed.  6.7 g  3  . cetirizine (ZYRTEC) 10 MG tablet Take 10 mg by mouth daily.      . cholecalciferol (VITAMIN D) 1000 UNITS tablet Take 1,000 Units by mouth daily.        . fluticasone (FLONASE) 50 MCG/ACT nasal spray Place 2 sprays into the nose daily.  16 g  2  . HYDROcodone-acetaminophen (NORCO/VICODIN) 5-325 MG per tablet Take 1 tablet by mouth every 6 (six) hours as needed for pain.  120 tablet  5  . meloxicam (MOBIC) 15 MG tablet TAKE ONE TABLET BY MOUTH EVERY DAY  30 tablet  6  . methocarbamol (ROBAXIN) 750 MG tablet Take 1 tablet (750 mg total) by mouth 4 (four) times daily.  120 tablet  3  . omeprazole (PRILOSEC) 40 MG capsule Take 1 capsule (40 mg total) by mouth daily.  90 capsule  2  . [DISCONTINUED] methocarbamol (ROBAXIN) 750 MG tablet Take 1 tablet (750 mg total) by mouth 4 (four) times daily.  120 tablet  3   No facility-administered encounter medications on file as of 05/25/2013.

## 2013-05-25 NOTE — Patient Instructions (Addendum)
You had your annual  wellness exam today  We will schedule you an evaluation of your left arm with [facility] soon  I am recommending an evaluation by Dr Loistine Chance for your worsening back pain   Please use the stool kit to send [place] back a sample to test for blood.  This is your colon CA screening test.   You need to have a TDaP vaccine but we are currently out of medications. Please return  At your leisure.  We will contact you with the bloodwork results

## 2013-05-25 NOTE — Assessment & Plan Note (Signed)
She has excessive sun exposure and a nonhealing skin lesion on the left posterior shoulder.  Referral to Dermatology for evaluation

## 2013-05-25 NOTE — Assessment & Plan Note (Signed)
Aggravated by scoliosis and loss of muscle tone. Narcotic dependent.  Referral to Dr. Loistine Chance for evaluation of alternative modalities for pain control.

## 2013-05-25 NOTE — Assessment & Plan Note (Signed)
Annual comprehensive exam was done including breast and pelvic exam was done . All screenings have been addressed .

## 2013-05-27 ENCOUNTER — Encounter: Payer: Self-pay | Admitting: Emergency Medicine

## 2013-05-28 DIAGNOSIS — E785 Hyperlipidemia, unspecified: Secondary | ICD-10-CM | POA: Insufficient documentation

## 2013-05-28 NOTE — Addendum Note (Signed)
Addended by: [name] on: 05/28/2013 12:23 PM   Modules accepted: Orders

## 2013-06-02 ENCOUNTER — Telehealth: Payer: Self-pay | Admitting: Internal Medicine

## 2013-06-02 NOTE — Telephone Encounter (Signed)
The ultrasound of her left upper  Arm showed no evidence of deep vein thrombosis or superficial thrombophlebitis.

## 2013-06-02 NOTE — Telephone Encounter (Signed)
Called and advised patient of results

## 2013-06-15 ENCOUNTER — Encounter: Payer: Self-pay | Admitting: Internal Medicine

## 2013-06-17 ENCOUNTER — Other Ambulatory Visit: Payer: Self-pay | Admitting: *Deleted

## 2013-06-18 NOTE — Telephone Encounter (Signed)
Last visit 05/25/13. Refill?

## 2013-06-18 NOTE — Telephone Encounter (Signed)
Patient is out of her medication (HYDROcodone-acetaminophen (NORCO/VICODIN) 5-325 MG per tablet) She is wanting it called into the pharmacy today.

## 2013-06-20 MED ORDER — HYDROCODONE-ACETAMINOPHEN 5-325 MG PO TABS: 1.0000 | ORAL_TABLET | Freq: Four times a day (QID) | ORAL | Status: DC | PRN

## 2013-06-20 NOTE — Telephone Encounter (Signed)
The vicodin can be refilled,  Remind patient that same day requests for refills are not possible

## 2013-06-21 ENCOUNTER — Telehealth: Payer: Self-pay | Admitting: Internal Medicine

## 2013-06-21 LAB — FECAL OCCULT BLOOD, GUAIAC: FECAL OCCULT BLD: NEGATIVE

## 2013-06-21 NOTE — Telephone Encounter (Signed)
Pt says she went to her pharmacy and they have not received her refill. She says they faxed over a refill request last Thursday.

## 2013-06-21 NOTE — Telephone Encounter (Signed)
Rx faxed to pharmacy  

## 2013-06-21 NOTE — Telephone Encounter (Signed)
Confirmed with pharmacy medication is waiting on patient to pick up. Called patient left message.

## 2013-06-24 ENCOUNTER — Other Ambulatory Visit: Payer: Self-pay | Admitting: *Deleted

## 2013-06-24 ENCOUNTER — Other Ambulatory Visit (INDEPENDENT_AMBULATORY_CARE_PROVIDER_SITE_OTHER): Payer: Medicare HMO

## 2013-06-24 DIAGNOSIS — Z139 Encounter for screening, unspecified: Secondary | ICD-10-CM

## 2013-06-29 ENCOUNTER — Encounter: Payer: Self-pay | Admitting: Internal Medicine

## 2013-07-01 NOTE — Telephone Encounter (Signed)
Mailed unread message to pt  

## 2013-08-16 ENCOUNTER — Telehealth: Payer: Self-pay | Admitting: Internal Medicine

## 2013-08-16 MED ORDER — HYDROCODONE-ACETAMINOPHEN 5-325 MG PO TABS: 1.0000 | ORAL_TABLET | Freq: Four times a day (QID) | ORAL | Status: DC | PRN

## 2013-08-16 NOTE — Telephone Encounter (Signed)
Last OV 7/14 , may refill please advise.

## 2013-08-16 NOTE — Telephone Encounter (Signed)
Pt called needing a rx for hydorcodone.  Her pharmacy stated she needed a written rx Pt is completely out of her meds

## 2013-08-16 NOTE — Telephone Encounter (Signed)
Patient told she must keep appointment for further refills with nurse visit.

## 2013-08-16 NOTE — Telephone Encounter (Signed)
rx printed.  She will have to pick them up monthly going forward

## 2013-08-19 ENCOUNTER — Ambulatory Visit (INDEPENDENT_AMBULATORY_CARE_PROVIDER_SITE_OTHER): Payer: Medicare HMO | Admitting: *Deleted

## 2013-08-19 DIAGNOSIS — Z23 Encounter for immunization: Secondary | ICD-10-CM

## 2013-08-20 ENCOUNTER — Encounter: Payer: Self-pay | Admitting: *Deleted

## 2013-08-24 ENCOUNTER — Other Ambulatory Visit (INDEPENDENT_AMBULATORY_CARE_PROVIDER_SITE_OTHER): Payer: Medicare HMO

## 2013-08-24 ENCOUNTER — Other Ambulatory Visit: Payer: Self-pay | Admitting: Internal Medicine

## 2013-08-24 DIAGNOSIS — Z1159 Encounter for screening for other viral diseases: Secondary | ICD-10-CM

## 2013-08-24 DIAGNOSIS — E785 Hyperlipidemia, unspecified: Secondary | ICD-10-CM

## 2013-08-24 LAB — LIPID PANEL
Cholesterol: 236 mg/dL — ABNORMAL HIGH (ref 0–200)
Total CHOL/HDL Ratio: 6
Triglycerides: 364 mg/dL — ABNORMAL HIGH (ref 0.0–149.0)
VLDL: 72.8 mg/dL — ABNORMAL HIGH (ref 0.0–40.0)

## 2013-08-24 LAB — HEPATIC FUNCTION PANEL
ALT: 9 U/L (ref 0–35)
AST: 20 U/L (ref 0–37)
Albumin: 4 g/dL (ref 3.5–5.2)
Alkaline Phosphatase: 59 U/L (ref 39–117)
Bilirubin, Direct: 0 mg/dL (ref 0.0–0.3)
Total Bilirubin: 0.7 mg/dL (ref 0.3–1.2)
Total Protein: 7.6 g/dL (ref 6.0–8.3)

## 2013-08-25 ENCOUNTER — Encounter: Payer: Self-pay | Admitting: Internal Medicine

## 2013-09-21 ENCOUNTER — Telehealth: Payer: Self-pay | Admitting: Internal Medicine

## 2013-09-21 MED ORDER — HYDROCODONE-ACETAMINOPHEN 5-325 MG PO TABS: 1.0000 | ORAL_TABLET | Freq: Four times a day (QID) | ORAL | Status: DC | PRN

## 2013-09-21 NOTE — Telephone Encounter (Signed)
HYDROcodone-acetaminophen (NORCO/VICODIN) 5-325 MG per tablet

## 2013-09-21 NOTE — Telephone Encounter (Signed)
Patient notified script ready

## 2013-09-21 NOTE — Telephone Encounter (Signed)
Ok to refill,  printed rx  

## 2013-09-21 NOTE — Telephone Encounter (Signed)
Last fill 08/16/13 please advise.

## 2013-09-30 ENCOUNTER — Telehealth: Payer: Self-pay | Admitting: Internal Medicine

## 2013-09-30 NOTE — Telephone Encounter (Signed)
Patient stated she did not have UDS she had mouth swab performed stated she will return for UDS , but has to check with husband to see when he can bring her. Patient refused to scheduling today.

## 2013-09-30 NOTE — Telephone Encounter (Signed)
Patient';s UDS was abnormal.  No hydrocodone was found in her system.,  Which is unusual since she gets a monthly script for me.  It was done on the day she came to pick up her prescription. So she either ran out several days before testing,  or is diverting.   She must return this week and submit another urine test.  Tell her it "because it was abnormal but no specifics!!!

## 2013-10-05 ENCOUNTER — Other Ambulatory Visit: Payer: Medicare HMO

## 2013-10-18 ENCOUNTER — Encounter: Payer: Self-pay | Admitting: Internal Medicine

## 2013-10-25 ENCOUNTER — Other Ambulatory Visit: Payer: Self-pay | Admitting: Internal Medicine

## 2013-10-25 ENCOUNTER — Telehealth: Payer: Self-pay | Admitting: Emergency Medicine

## 2013-10-25 MED ORDER — HYDROCODONE-ACETAMINOPHEN 5-325 MG PO TABS: 1.0000 | ORAL_TABLET | Freq: Four times a day (QID) | ORAL | Status: DC | PRN

## 2013-10-25 NOTE — Telephone Encounter (Signed)
Last visit 05/25/13, refill?

## 2013-10-25 NOTE — Telephone Encounter (Signed)
Okay to fill last fill date 09/21/13

## 2013-10-25 NOTE — Telephone Encounter (Signed)
Left message for patient to call office script ready but needs UDS.

## 2013-10-25 NOTE — Telephone Encounter (Signed)
Since her last UDS was abnormal she will have to submit a urine when she comes to pick up the hydrocodone refill

## 2013-10-25 NOTE — Telephone Encounter (Signed)
Pt calling requesting refill on HYDROcodone-acetaminophen (NORCO/VICODIN) 5-325 MG per tablet. Please advise

## 2013-10-26 NOTE — Telephone Encounter (Signed)
Pt notified and verbalized understanding on need for Rx pick up and UDS

## 2013-10-27 ENCOUNTER — Encounter: Payer: Self-pay | Admitting: Emergency Medicine

## 2013-10-27 ENCOUNTER — Telehealth: Payer: Self-pay | Admitting: Internal Medicine

## 2013-10-27 NOTE — Telephone Encounter (Signed)
11/02/13 @ [facility]

## 2013-10-27 NOTE — Telephone Encounter (Signed)
Needs mammogram, [facility].  Not Thursdays.  Any other day, early a.m.

## 2013-11-05 ENCOUNTER — Ambulatory Visit (INDEPENDENT_AMBULATORY_CARE_PROVIDER_SITE_OTHER): Payer: Medicare HMO | Admitting: Adult Health

## 2013-11-05 ENCOUNTER — Encounter: Payer: Self-pay | Admitting: Adult Health

## 2013-11-05 VITALS — BP 130/60 | HR 78 | Temp 98.2°F | Resp 12 | Wt 127.0 lb

## 2013-11-05 DIAGNOSIS — R21 Rash and other nonspecific skin eruption: Secondary | ICD-10-CM

## 2013-11-05 DIAGNOSIS — R238 Other skin changes: Secondary | ICD-10-CM

## 2013-11-05 DIAGNOSIS — Z8619 Personal history of other infectious and parasitic diseases: Secondary | ICD-10-CM | POA: Insufficient documentation

## 2013-11-05 MED ORDER — FAMCICLOVIR 500 MG PO TABS: 500.0000 mg | ORAL_TABLET | Freq: Three times a day (TID) | ORAL | Status: DC

## 2013-11-05 MED ORDER — GABAPENTIN 300 MG PO CAPS: 300.0000 mg | ORAL_CAPSULE | Freq: Two times a day (BID) | ORAL | Status: DC

## 2013-11-05 NOTE — Progress Notes (Signed)
   Subjective:    Patient ID: Suzanne Cole, female    DOB: [birth year], 62 y.o.   MRN: [id number]  HPI  Patient is a pleasant 61 y/o female who presents to clinic with rash on the right side of her back at the level of the bra strap. She also has a small rash on the right side of her breast. Hx of shingles and believes this is what she has. She reports being sick last week with a "bad cold". Began to feel discomfort in her back on Friday although she did not notice any rash. She thought symptoms were related to just being sick. Noticed the rash approximately 2 days ago.  Also, pt is followed by Dr. Sharlet Salina at [facility] for neuropathy. She is out of gabapentin and needs refill. She has not been able to get medication refilled with the holiday. Requesting 1 time refill.   Current Outpatient Prescriptions on File Prior to Visit  Medication Sig Dispense Refill  . cetirizine (ZYRTEC) 10 MG tablet Take 10 mg by mouth daily.      . cholecalciferol (VITAMIN D) 1000 UNITS tablet Take 1,000 Units by mouth daily.        . fluticasone (FLONASE) 50 MCG/ACT nasal spray Place 2 sprays into the nose daily.  16 g  2  . HYDROcodone-acetaminophen (NORCO/VICODIN) 5-325 MG per tablet Take 1 tablet by mouth every 6 (six) hours as needed.  120 tablet  0  . meloxicam (MOBIC) 15 MG tablet TAKE ONE TABLET BY MOUTH EVERY DAY  30 tablet  6  . methocarbamol (ROBAXIN) 750 MG tablet TAKE ONE TABLET BY MOUTH 4 TIMES DAILY  120 tablet  0  . omeprazole (PRILOSEC) 40 MG capsule Take 1 capsule (40 mg total) by mouth daily.  90 capsule  2   No current facility-administered medications on file prior to visit.     Review of Systems Positive for rash on right side of back, pain Negative for fever, chills, taking new medications, changing soaps, skin products, detergent     Objective:   Physical Exam  Constitutional: She is oriented to person, place, and time.  Neurological: She is alert and oriented to person, place, and time.    Skin: Rash noted.  Vesicular rash on right side of back. Small rash right side of breast without vesicles.  Psychiatric: She has a normal mood and affect. Her behavior is normal. Judgment and thought content normal.          Assessment & Plan:

## 2013-11-05 NOTE — Patient Instructions (Signed)
Shingles Shingles (herpes zoster) is an infection that is caused by the same virus that causes chickenpox (varicella). The infection causes a painful skin rash and fluid-filled blisters, which eventually break open, crust over, and heal. It may occur in any area of the body, but it usually affects only one side of the body or face. The pain of shingles usually lasts about 1 month. However, some people with shingles may develop long-term (chronic) pain in the affected area of the body. Shingles often occurs many years after the person had chickenpox. It is more common:  In people older than 50 years.  In people with weakened immune systems, such as those with HIV, AIDS, or cancer.  In people taking medicines that weaken the immune system, such as transplant medicines.  In people under great stress. CAUSES  Shingles is caused by the varicella zoster virus (VZV), which also causes chickenpox. After a person is infected with the virus, it can remain in the person's body for years in an inactive state (dormant). To cause shingles, the virus reactivates and breaks out as an infection in a nerve root. The virus can be spread from person to person (contagious) through contact with open blisters of the shingles rash. It will only spread to people who have not had chickenpox. When these people are exposed to the virus, they may develop chickenpox. They will not develop shingles. Once the blisters scab over, the person is no longer contagious and cannot spread the virus to others. SYMPTOMS  Shingles shows up in stages. The initial symptoms may be pain, itching, and tingling in an area of the skin. This pain is usually described as burning, stabbing, or throbbing.In a few days or weeks, a painful red rash will appear in the area where the pain, itching, and tingling were felt. The rash is usually on one side of the body in a band or belt-like pattern. Then, the rash usually turns into fluid-filled blisters. They  will scab over and dry up in approximately 2 3 weeks. Flu-like symptoms may also occur with the initial symptoms, the rash, or the blisters. These may include:  Fever.  Chills.  Headache.  Upset stomach. DIAGNOSIS  Your caregiver will perform a skin exam to diagnose shingles. Skin scrapings or fluid samples may also be taken from the blisters. This sample will be examined under a microscope or sent to a lab for further testing. TREATMENT  There is no specific cure for shingles. Your caregiver will likely prescribe medicines to help you manage the pain, recover faster, and avoid long-term problems. This may include antiviral drugs, anti-inflammatory drugs, and pain medicines. HOME CARE INSTRUCTIONS   Take a cool bath or apply cool compresses to the area of the rash or blisters as directed. This may help with the pain and itching.   Only take over-the-counter or prescription medicines as directed by your caregiver.   Rest as directed by your caregiver.  Keep your rash and blisters clean with mild soap and cool water or as directed by your caregiver.  Do not pick your blisters or scratch your rash. Apply an anti-itch cream or numbing creams to the affected area as directed by your caregiver.  Keep your shingles rash covered with a loose bandage (dressing).  Avoid skin contact with:  Babies.   Pregnant women.   Children with eczema.   Elderly people with transplants.   People with chronic illnesses, such as leukemia or AIDS.   Wear loose-fitting clothing to help ease   the pain of material rubbing against the rash.  Keep all follow-up appointments with your caregiver.If the area involved is on your face, you may receive a referral for follow-up to a specialist, such as an eye doctor (ophthalmologist) or an ear, nose, and throat (ENT) doctor. Keeping all follow-up appointments will help you avoid eye complications, chronic pain, or disability.  SEEK IMMEDIATE MEDICAL  CARE IF:   You have facial pain, pain around the eye area, or loss of feeling on one side of your face.  You have ear pain or ringing in your ear.  You have loss of taste.  Your pain is not relieved with prescribed medicines.   Your redness or swelling spreads.   You have more pain and swelling.  Your condition is worsening or has changed.   You have a feveror persistent symptoms for more than 2 3 days.  You have a fever and your symptoms suddenly get worse. MAKE SURE YOU:  Understand these instructions.  Will watch your condition.  Will get help right away if you are not doing well or get worse. Document Released: 10/28/2005 Document Revised: 07/22/2012 Document Reviewed: 06/11/2012 [employer] Patient Information 2014 [employer].  

## 2013-11-05 NOTE — Progress Notes (Signed)
Pre visit review using our clinic review tool, if applicable. No additional management support is needed unless otherwise documented below in the visit note. 

## 2013-11-05 NOTE — Assessment & Plan Note (Signed)
Shingles. Start famciclovir 500 mg tid x 7 days. Use calamine lotion to help dry vesicles. Refill gabapentin - advised patient to schedule appt with Dr. Sharlet Salina for future refills. She is followed by Dr. Sharlet Salina for chronic pain.

## 2013-11-24 ENCOUNTER — Other Ambulatory Visit: Payer: Self-pay | Admitting: Internal Medicine

## 2013-11-24 MED ORDER — HYDROCODONE-ACETAMINOPHEN 5-325 MG PO TABS: 1.0000 | ORAL_TABLET | Freq: Four times a day (QID) | ORAL | Status: DC | PRN

## 2013-11-24 NOTE — Telephone Encounter (Signed)
Ok refill? 

## 2013-11-24 NOTE — Telephone Encounter (Signed)
Ok to refill,  Authorized in epic and sent to [facility]

## 2013-11-24 NOTE — Telephone Encounter (Signed)
Ok to refill,  printed rx  

## 2013-11-24 NOTE — Telephone Encounter (Signed)
Calling for refill of hydrocodone.  Asking for call when ready for pick up.

## 2013-11-24 NOTE — Telephone Encounter (Signed)
Last non acute visit 05/25/13

## 2013-11-25 NOTE — Telephone Encounter (Signed)
Patient picked up script

## 2013-11-30 LAB — HM MAMMOGRAPHY: HM Mammogram: NORMAL

## 2013-12-03 ENCOUNTER — Encounter: Payer: Self-pay | Admitting: Internal Medicine

## 2013-12-23 ENCOUNTER — Other Ambulatory Visit: Payer: Self-pay | Admitting: *Deleted

## 2013-12-28 MED ORDER — GABAPENTIN 300 MG PO CAPS: 300.0000 mg | ORAL_CAPSULE | Freq: Two times a day (BID) | ORAL | Status: DC

## 2013-12-28 MED ORDER — METHOCARBAMOL 750 MG PO TABS: ORAL_TABLET | ORAL | Status: DC

## 2013-12-28 MED ORDER — MELOXICAM 15 MG PO TABS: ORAL_TABLET | ORAL | Status: DC

## 2013-12-28 NOTE — Telephone Encounter (Signed)
Ok to refill,  Refill sent to [facility]

## 2014-01-10 ENCOUNTER — Telehealth: Payer: Self-pay | Admitting: Internal Medicine

## 2014-01-10 MED ORDER — HYDROCODONE-ACETAMINOPHEN 5-325 MG PO TABS: 1.0000 | ORAL_TABLET | Freq: Four times a day (QID) | ORAL | Status: DC | PRN

## 2014-01-10 NOTE — Telephone Encounter (Signed)
Ok refill? 

## 2014-01-10 NOTE — Telephone Encounter (Signed)
Hydrocodone refill needed.  Please call when ready for pickup

## 2014-01-10 NOTE — Telephone Encounter (Signed)
Ok to refill,  printed rx  

## 2014-01-11 NOTE — Telephone Encounter (Signed)
Patient notified as requested  Placed script up front for pickup

## 2014-02-14 ENCOUNTER — Telehealth: Payer: Self-pay | Admitting: Internal Medicine

## 2014-02-14 MED ORDER — HYDROCODONE-ACETAMINOPHEN 5-325 MG PO TABS: 1.0000 | ORAL_TABLET | Freq: Four times a day (QID) | ORAL | Status: DC | PRN

## 2014-02-14 NOTE — Telephone Encounter (Signed)
Pt called to get a refill on hydrocodoine Please advise when ready for pick Pt has 1 pill left

## 2014-02-14 NOTE — Telephone Encounter (Signed)
Patient notified

## 2014-02-14 NOTE — Telephone Encounter (Signed)
refilled 

## 2014-02-14 NOTE — Telephone Encounter (Signed)
Last refill 01/10/14 ok to fill I have printed

## 2014-03-17 ENCOUNTER — Telehealth: Payer: Self-pay | Admitting: Internal Medicine

## 2014-03-17 MED ORDER — HYDROCODONE-ACETAMINOPHEN 5-325 MG PO TABS: 1.0000 | ORAL_TABLET | Freq: Four times a day (QID) | ORAL | Status: DC | PRN

## 2014-03-17 NOTE — Telephone Encounter (Signed)
Last fill 02/14/14 120 tablet ok to fill?

## 2014-03-17 NOTE — Telephone Encounter (Signed)
Hydrocone refill needed.  Call when ready for pick up.

## 2014-03-17 NOTE — Telephone Encounter (Signed)
Ok to refill,  printed rx  

## 2014-03-17 NOTE — Telephone Encounter (Signed)
Patient informed prescription was ready to be picked up

## 2014-04-25 ENCOUNTER — Telehealth: Payer: Self-pay | Admitting: Internal Medicine

## 2014-04-25 MED ORDER — HYDROCODONE-ACETAMINOPHEN 5-325 MG PO TABS: 1.0000 | ORAL_TABLET | Freq: Four times a day (QID) | ORAL | Status: DC | PRN

## 2014-04-25 NOTE — Telephone Encounter (Signed)
Ok to refill 

## 2014-04-25 NOTE — Telephone Encounter (Signed)
Last OV 11/05/13 acute with [facility], has appt scheduled 05/18/14

## 2014-04-25 NOTE — Telephone Encounter (Signed)
Patient notified script ready for pick up and placed up front.

## 2014-04-25 NOTE — Telephone Encounter (Signed)
HYDROcodone-acetaminophen (NORCO/VICODIN) 5-325 MG per

## 2014-05-18 ENCOUNTER — Encounter: Payer: Self-pay | Admitting: Internal Medicine

## 2014-05-18 ENCOUNTER — Ambulatory Visit (INDEPENDENT_AMBULATORY_CARE_PROVIDER_SITE_OTHER): Payer: Medicare HMO | Admitting: Internal Medicine

## 2014-05-18 VITALS — BP 148/80 | HR 65 | Temp 98.6°F | Resp 16 | Ht 59.0 in | Wt 130.2 lb

## 2014-05-18 DIAGNOSIS — Z79899 Other long term (current) drug therapy: Secondary | ICD-10-CM

## 2014-05-18 DIAGNOSIS — E785 Hyperlipidemia, unspecified: Secondary | ICD-10-CM

## 2014-05-18 DIAGNOSIS — Z23 Encounter for immunization: Secondary | ICD-10-CM

## 2014-05-18 DIAGNOSIS — G8929 Other chronic pain: Secondary | ICD-10-CM

## 2014-05-18 DIAGNOSIS — R5383 Other fatigue: Secondary | ICD-10-CM

## 2014-05-18 DIAGNOSIS — R599 Enlarged lymph nodes, unspecified: Secondary | ICD-10-CM

## 2014-05-18 DIAGNOSIS — R221 Localized swelling, mass and lump, neck: Secondary | ICD-10-CM

## 2014-05-18 DIAGNOSIS — Z0000 Encounter for general adult medical examination without abnormal findings: Secondary | ICD-10-CM

## 2014-05-18 DIAGNOSIS — R5381 Other malaise: Secondary | ICD-10-CM

## 2014-05-18 DIAGNOSIS — I10 Essential (primary) hypertension: Secondary | ICD-10-CM

## 2014-05-18 DIAGNOSIS — R590 Localized enlarged lymph nodes: Secondary | ICD-10-CM

## 2014-05-18 DIAGNOSIS — Z1211 Encounter for screening for malignant neoplasm of colon: Secondary | ICD-10-CM

## 2014-05-18 DIAGNOSIS — M549 Dorsalgia, unspecified: Secondary | ICD-10-CM

## 2014-05-18 MED ORDER — HYDROCODONE-ACETAMINOPHEN 5-325 MG PO TABS: 1.0000 | ORAL_TABLET | Freq: Four times a day (QID) | ORAL | Status: DC | PRN

## 2014-05-18 NOTE — Assessment & Plan Note (Addendum)
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 She has not used tobacco in over 10 year.  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 No other LAD appreciated on exam.  No history of night sweats or unintentional weight loss.   Referral for biopsy by ENT

## 2014-05-18 NOTE — Patient Instructions (Signed)
You had your annual  wellness exam today.  We will schedule your mammogram at [facility] in January .  ENT referral and CT of neck prior to appt to evaluate the lump in your neck   Please make an appt for fasting labs at your earliest convenience.   We will contact you with the bloodwork results

## 2014-05-18 NOTE — Progress Notes (Signed)
Patient ID: Suzanne Cole, female   DOB: 1951-02-25, 63 y.o.   MRN: [id number]     Subjective:    Suzanne Cole is a 63 y.o. female who  is here for annual Medicare wellness examination and management of other chronic and acute problems.   The risk factors are reflected in the social history.  The roster of all physicians providing medical care to patient - is listed in the Snapshot section of the chart.  Activities of daily living:  The patient is 100% independent in all ADLs: dressing, toileting, feeding as well as independent mobility  Home safety : The patient has smoke detectors in the home. They wear seatbelts.  There are no firearms at home. There is no violence in the home.   There is no risks for hepatitis, STDs or HIV. There is no   history of blood transfusion. They have no travel history to infectious disease endemic areas of the world.  The patient has seen their dentist in the last six month. They have seen their eye doctor in the last year. They admit to slight hearing difficulty with regard to whispered voices and some television programs.  They have deferred audiologic testing in the last year.  They do not  have excessive sun exposure. Discussed the need for sun protection: hats, long sleeves and use of sunscreen if there is significant sun exposure.   Diet: the importance of a healthy diet is discussed. They do have a healthy diet.  The benefits of regular aerobic exercise were discussed. She does not exercise due to severe DDD of lumbar spine, narcotic dependent .   Depression screen: there are no signs or vegative symptoms of depression- irritability, change in appetite, anhedonia, sadness/tearfullness.  Cognitive assessment: the patient manages all their financial and personal affairs and is actively engaged. They could relate day,date,year and events; recalled 2/3 objects at 3 minutes; performed clock-face test normally.  The following portions of the patient's history  were reviewed and updated as appropriate: allergies, current medications, past family history, past medical history,  past surgical history, past social history  and problem list.  Visual acuity was not assessed per patient preference since she has regular follow up with her ophthalmologist. Hearing and body mass index were assessed and reviewed.   During the course of the visit the patient was educated and counseled about appropriate screening and preventive services including : fall prevention , diabetes screening, nutrition counseling, colorectal cancer screening, and recommended immunizations.     Menstrual History: OB History   Grav Para Term Preterm Abortions TAB SAB Ect Mult Living                  Menarche age: 67  No LMP recorded. Patient has had a hysterectomy.    The following portions of the patient's history were reviewed and updated as appropriate: allergies, current medications, past family history, past medical history, past social history, past surgical history and problem list.  Review of Systems A comprehensive review of systems was negative.    Objective:   BP 148/80  Pulse 65  Temp(Src) 98.6 F (37 C) (Oral)  Resp 16  Ht 4\' 11"  (1.499 m)  Wt 130 lb 4 oz (59.081 kg)  BMI 26.29 kg/m2  SpO2 99% General appearance: alert, cooperative and appears stated age Head: Normocephalic, without obvious abnormality, atraumatic Eyes: conjunctivae/corneas clear. PERRL, EOM's intact. Fundi benign. Ears: normal TM's and external ear canals both ears Nose: Nares normal. Septum midline. Mucosa normal.  No drainage or sinus tenderness. Throat: lips, mucosa, and tongue normal; teeth and gums normal Neck: 1 cm nontender right cervical chain  no carotid bruit, no JVD, supple, symmetrical, trachea midline and thyroid not enlarged, symmetric, no tenderness/mass/nodules Lungs: clear to auscultation bilaterally Breasts: normal appearance, no masses or tenderness Heart: regular rate  and rhythm, S1, S2 normal, no murmur, click, rub or gallop Abdomen: soft, non-tender; bowel sounds normal; no masses,  no organomegaly Extremities: extremities normal, atraumatic, no cyanosis or edema Pulses: 2+ and symmetric Skin: Skin color, texture, turgor normal. No rashes or lesions Neurologic: Alert and oriented X 3, normal strength and tone. Normal symmetric reflexes. Normal coordination , antalgic gait gait.   Assessment and Plan:   Anterior cervical lymphadenopathy She has an enlarged LN in the right cervical chain which was first noticed a year ago in the setting of recent infection but has persisted and is now enlarged. She has not used tobacco in over 10 year.  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 No other LAD appreciated on exam.  No history of night sweats or unintentional weight loss.   Referral for biopsy by ENT   Routine general medical examination at a health care facility Annual comprehensive exam was done including breast, excluding pelvic and PAP smear. All screenings have been addressed  And brought up to date. .   Screening for colon cancer Given her FH of colon CA , incomplete colonoscopy in 2005 will refer for evaluation with colonoscopy vs colonography  Other and unspecified hyperlipidemia Mild hypertriglyceridemai was noted last year during annual exam and  Patient deferred medications in favor of dietary management.  Repeat testing is overdue.  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   Prior referral to Dr. Loistine Chance in September 2014 was done for evaluation of alternative modalities for pain control. Gabapentin,  Lyrica, and Cymbalta trials were recommended.  PT was offered but declined due to out of pocket expense of $45/session.  MRI and epidural injections were also offered but deferred by patient.   I will continue to refill her Norco at the current dose and quantity but will strongly recommend neurosurgical  Consult if her pain increases.  Elevated blood-pressure reading without diagnosis of hypertension Elevated today.  Reviewed list of meds, patient is taking NSAIDs  Daily .  It.  Have asked patient to recheck bp at home a minimum of 5 times over the next 4 weeks and call readings to office for adjustment of medications.      Updated Medication List Outpatient Encounter Prescriptions as of 05/18/2014  Medication Sig  . cetirizine (ZYRTEC) 10 MG tablet Take 10 mg by mouth daily.  . cholecalciferol (VITAMIN D) 1000 UNITS tablet Take 1,000 Units by mouth daily.    . fluticasone (FLONASE) 50 MCG/ACT nasal spray Place 2 sprays into the nose daily.  [name] gabapentin (NEURONTIN) 300 MG capsule Take 1 capsule (300 mg total) by mouth 2 (two) times daily.  [name] HYDROcodone-acetaminophen (NORCO/VICODIN) 5-325 MG per tablet Take 1 tablet by mouth every 6 (six) hours as needed. May refill on or after May 25 2014  .  meloxicam (MOBIC) 15 MG tablet TAKE ONE TABLET BY MOUTH EVERY DAY  . methocarbamol (ROBAXIN) 750 MG tablet TAKE ONE TABLET BY MOUTH 4 TIMES DAILY  . omeprazole (PRILOSEC) 40 MG capsule Take 1 capsule (40 mg total) by mouth daily.  . [DISCONTINUED] HYDROcodone-acetaminophen (NORCO/VICODIN) 5-325 MG per tablet Take 1 tablet by mouth every 6 (six) hours as needed.  . [DISCONTINUED] famciclovir (FAMVIR) 500 MG tablet Take 1 tablet (500 mg total) by mouth 3 (three) times daily.

## 2014-05-18 NOTE — Progress Notes (Signed)
Pre-visit discussion using our clinic review tool. No additional management support is needed unless otherwise documented below in the visit note.  

## 2014-05-20 ENCOUNTER — Other Ambulatory Visit: Payer: Medicare HMO

## 2014-05-21 ENCOUNTER — Encounter: Payer: Self-pay | Admitting: Internal Medicine

## 2014-05-21 NOTE — Assessment & Plan Note (Signed)
Annual comprehensive exam was done including breast, excluding pelvic and PAP smear. All screenings have been addressed  And brought up to date. [name]

## 2014-05-21 NOTE — Assessment & Plan Note (Addendum)
L3, severe , Secondary to degenerative disk disease , aggravated by severe scoliosis and loss of bulk muscle during prior nephrectomy.[name]  Has been narcotic dependent since age 63.   Prior referral to Dr. Loistine Chance in September 2014 was done for evaluation of alternative modalities for pain control. Gabapentin, Lyrica, and Cymbalta trials were recommended.  PT was offered but declined due to out of pocket expense of $45/session.  MRI and epidural injections were also offered but deferred by patient.   I will continue to refill her Norco at the current dose and quantity but will strongly recommend neurosurgical  Consult if her pain increases.

## 2014-05-21 NOTE — Assessment & Plan Note (Signed)
Given her FH of colon CA , incomplete colonoscopy in 2005 will refer for evaluation with colonoscopy vs colonography

## 2014-05-21 NOTE — Assessment & Plan Note (Addendum)
Mild hypertriglyceridemai was noted last year during annual exam and  Patient deferred medications in favor of dietary management.  Repeat testing is overdue.  Lab Results  Component Value Date   CHOL 236* 08/24/2013   HDL 40.30 08/24/2013   LDLDIRECT 114.7 08/24/2013   TRIG 364.0* 08/24/2013   CHOLHDL 6 08/24/2013

## 2014-05-21 NOTE — Assessment & Plan Note (Signed)
Elevated today.  Reviewed list of meds, patient is taking NSAIDs  Daily .  It.  Have asked patient to recheck bp at home a minimum of 5 times over the next 4 weeks and call readings to office for adjustment of medications.

## 2014-05-26 ENCOUNTER — Other Ambulatory Visit (INDEPENDENT_AMBULATORY_CARE_PROVIDER_SITE_OTHER): Payer: Medicare HMO

## 2014-05-26 DIAGNOSIS — R5383 Other fatigue: Principal | ICD-10-CM

## 2014-05-26 DIAGNOSIS — Z79899 Other long term (current) drug therapy: Secondary | ICD-10-CM

## 2014-05-26 DIAGNOSIS — R5381 Other malaise: Secondary | ICD-10-CM

## 2014-05-26 DIAGNOSIS — E785 Hyperlipidemia, unspecified: Secondary | ICD-10-CM

## 2014-05-26 DIAGNOSIS — Z1159 Encounter for screening for other viral diseases: Secondary | ICD-10-CM

## 2014-05-26 LAB — CBC WITH DIFFERENTIAL/PLATELET
Basophils Absolute: 0 10*3/uL (ref 0.0–0.1)
Basophils Relative: 0.5 % (ref 0.0–3.0)
EOS ABS: 0.2 10*3/uL (ref 0.0–0.7)
Eosinophils Relative: 2.7 % (ref 0.0–5.0)
HCT: 39.6 % (ref 36.0–46.0)
HEMOGLOBIN: 13.2 g/dL (ref 12.0–15.0)
LYMPHS PCT: 32.8 % (ref 12.0–46.0)
Lymphs Abs: 2.4 10*3/uL (ref 0.7–4.0)
MCHC: 33.3 g/dL (ref 30.0–36.0)
MCV: 89.5 fl (ref 78.0–100.0)
Monocytes Absolute: 0.5 10*3/uL (ref 0.1–1.0)
Monocytes Relative: 7.6 % (ref 3.0–12.0)
NEUTROS ABS: 4.1 10*3/uL (ref 1.4–7.7)
NEUTROS PCT: 56.4 % (ref 43.0–77.0)
PLATELETS: 312 10*3/uL (ref 150.0–400.0)
RBC: 4.42 Mil/uL (ref 3.87–5.11)
RDW: 13 % (ref 11.5–15.5)
WBC: 7.2 10*3/uL (ref 4.0–10.5)

## 2014-05-26 LAB — LIPID PANEL
CHOL/HDL RATIO: 5
Cholesterol: 213 mg/dL — ABNORMAL HIGH (ref 0–200)
HDL: 40.9 mg/dL (ref >39.00)
LDL Cholesterol: 111 mg/dL — ABNORMAL HIGH (ref 0–99)
NonHDL: 172.1
TRIGLYCERIDES: 307 mg/dL — AB (ref 0.0–149.0)
VLDL: 61.4 mg/dL — ABNORMAL HIGH (ref 0.0–40.0)

## 2014-05-26 LAB — COMPREHENSIVE METABOLIC PANEL
ALT: 17 U/L (ref 0–35)
AST: 19 U/L (ref 0–37)
Albumin: 4 g/dL (ref 3.5–5.2)
Alkaline Phosphatase: 62 U/L (ref 39–117)
BUN: 21 mg/dL (ref 6–23)
CHLORIDE: 111 meq/L (ref 96–112)
CO2: 24 mEq/L (ref 19–32)
CREATININE: 0.9 mg/dL (ref 0.4–1.2)
Calcium: 9.4 mg/dL (ref 8.4–10.5)
GFR: 68.14 mL/min (ref >60.00)
Glucose, Bld: 107 mg/dL — ABNORMAL HIGH (ref 70–99)
Potassium: 4.2 mEq/L (ref 3.5–5.1)
SODIUM: 141 meq/L (ref 135–145)
Total Bilirubin: 0.7 mg/dL (ref 0.2–1.2)
Total Protein: 7.6 g/dL (ref 6.0–8.3)

## 2014-05-26 LAB — TSH: TSH: 0.77 u[IU]/mL (ref 0.35–4.50)

## 2014-05-27 ENCOUNTER — Ambulatory Visit: Payer: Self-pay | Admitting: Internal Medicine

## 2014-05-27 LAB — HEPATITIS C ANTIBODY: HCV Ab: NEGATIVE

## 2014-05-29 ENCOUNTER — Encounter: Payer: Self-pay | Admitting: Internal Medicine

## 2014-06-01 ENCOUNTER — Telehealth: Payer: Self-pay | Admitting: Internal Medicine

## 2014-06-01 ENCOUNTER — Encounter: Payer: Self-pay | Admitting: Internal Medicine

## 2014-06-01 DIAGNOSIS — I6522 Occlusion and stenosis of left carotid artery: Secondary | ICD-10-CM

## 2014-06-01 DIAGNOSIS — R590 Localized enlarged lymph nodes: Secondary | ICD-10-CM

## 2014-06-01 NOTE — Telephone Encounter (Signed)
Patient notified and voiced understanding.

## 2014-06-01 NOTE — Telephone Encounter (Signed)
CT of her neck did show a < 2 cm LN or mass in the area we palpated,  that should be biopsied to rule out malignancy.  ENT referral hyas been made,  Per [facility] waiting  On callback

## 2014-06-08 ENCOUNTER — Encounter: Payer: Self-pay | Admitting: Internal Medicine

## 2014-06-22 ENCOUNTER — Telehealth: Payer: Self-pay | Admitting: Internal Medicine

## 2014-06-22 NOTE — Telephone Encounter (Signed)
Please advise 

## 2014-06-22 NOTE — Telephone Encounter (Signed)
Pt needs refill on hydrocodone 10-325mg  per tab

## 2014-06-23 MED ORDER — HYDROCODONE-ACETAMINOPHEN 5-325 MG PO TABS: 1.0000 | ORAL_TABLET | Freq: Four times a day (QID) | ORAL | Status: DC | PRN

## 2014-06-23 NOTE — Telephone Encounter (Signed)
Notified patient scripts ready for pick up.

## 2014-06-23 NOTE — Telephone Encounter (Signed)
3 months of refills on hydrocodone on printer, first may be filled on aug 15

## 2014-06-25 ENCOUNTER — Other Ambulatory Visit: Payer: Self-pay | Admitting: Internal Medicine

## 2014-06-27 NOTE — Telephone Encounter (Signed)
Refill

## 2014-06-29 NOTE — Telephone Encounter (Signed)
Ok to refill,  Refill sent  

## 2014-09-26 ENCOUNTER — Other Ambulatory Visit: Payer: Self-pay | Admitting: *Deleted

## 2014-09-26 MED ORDER — HYDROCODONE-ACETAMINOPHEN 5-325 MG PO TABS: 1.0000 | ORAL_TABLET | Freq: Four times a day (QID) | ORAL | Status: DC | PRN

## 2014-09-26 NOTE — Telephone Encounter (Signed)
Pt called requesting refill on Hydrocodone.  Last OV 7.8.15, last refill 8.13.15.  Next appoint 12.30.15.  Please advise refill.

## 2014-09-26 NOTE — Telephone Encounter (Signed)
Ok to refill,  printed rx s for nov and dec

## 2014-09-27 NOTE — Telephone Encounter (Signed)
Patient notified scripts ready for pick up and placed at front desk.

## 2014-10-14 ENCOUNTER — Ambulatory Visit: Payer: Medicare HMO | Admitting: Internal Medicine

## 2014-10-26 ENCOUNTER — Telehealth: Payer: Self-pay | Admitting: Internal Medicine

## 2014-10-26 NOTE — Telephone Encounter (Signed)
Spoke with pt.  Pt advised two Rx's were written on 11.16.15 and given to her and per policy it could not be written.  Pt became upset and stated she was done with Dr offices.

## 2014-10-26 NOTE — Telephone Encounter (Signed)
HYDROcodone-acetaminophen (NORCO/VICODIN) 5-325 MG Call when completed.

## 2014-10-26 NOTE — Telephone Encounter (Signed)
Patient was given a prescriptino to fill on Dec 15th on Nov [id number]  OPer our policy  I cannot not give her another one dated same

## 2014-10-27 ENCOUNTER — Other Ambulatory Visit: Payer: Self-pay | Admitting: Internal Medicine

## 2014-10-27 ENCOUNTER — Encounter: Payer: Self-pay | Admitting: Internal Medicine

## 2014-10-27 NOTE — Telephone Encounter (Signed)
Last refill 09/26/14. Appt sch 11/09/14

## 2014-10-28 MED ORDER — HYDROCODONE-ACETAMINOPHEN 5-325 MG PO TABS: 1.0000 | ORAL_TABLET | Freq: Four times a day (QID) | ORAL | Status: DC | PRN

## 2014-10-28 NOTE — Telephone Encounter (Signed)
Ok to refill,  printed rx . Please handle the other issue re appt

## 2014-11-01 ENCOUNTER — Encounter: Payer: Self-pay | Admitting: Internal Medicine

## 2014-11-01 NOTE — Telephone Encounter (Signed)
What labs?

## 2014-11-02 ENCOUNTER — Other Ambulatory Visit: Payer: Self-pay | Admitting: Internal Medicine

## 2014-11-02 DIAGNOSIS — E559 Vitamin D deficiency, unspecified: Secondary | ICD-10-CM

## 2014-11-02 DIAGNOSIS — E785 Hyperlipidemia, unspecified: Secondary | ICD-10-CM

## 2014-11-02 DIAGNOSIS — R5383 Other fatigue: Secondary | ICD-10-CM

## 2014-11-09 ENCOUNTER — Ambulatory Visit (INDEPENDENT_AMBULATORY_CARE_PROVIDER_SITE_OTHER): Payer: Commercial Managed Care - HMO | Admitting: Internal Medicine

## 2014-11-09 ENCOUNTER — Encounter: Payer: Self-pay | Admitting: Internal Medicine

## 2014-11-09 ENCOUNTER — Ambulatory Visit (INDEPENDENT_AMBULATORY_CARE_PROVIDER_SITE_OTHER): Payer: Commercial Managed Care - HMO | Admitting: *Deleted

## 2014-11-09 VITALS — BP 148/76 | HR 73 | Temp 98.0°F | Resp 16 | Ht 59.0 in | Wt 130.0 lb

## 2014-11-09 DIAGNOSIS — I10 Essential (primary) hypertension: Secondary | ICD-10-CM

## 2014-11-09 DIAGNOSIS — M5090 Cervical disc disorder, unspecified, unspecified cervical region: Secondary | ICD-10-CM

## 2014-11-09 DIAGNOSIS — E785 Hyperlipidemia, unspecified: Secondary | ICD-10-CM | POA: Diagnosis not present

## 2014-11-09 DIAGNOSIS — I208 Other forms of angina pectoris: Secondary | ICD-10-CM

## 2014-11-09 DIAGNOSIS — R7301 Impaired fasting glucose: Secondary | ICD-10-CM | POA: Diagnosis not present

## 2014-11-09 DIAGNOSIS — R5383 Other fatigue: Secondary | ICD-10-CM

## 2014-11-09 DIAGNOSIS — E559 Vitamin D deficiency, unspecified: Secondary | ICD-10-CM

## 2014-11-09 DIAGNOSIS — I6522 Occlusion and stenosis of left carotid artery: Secondary | ICD-10-CM

## 2014-11-09 DIAGNOSIS — J4489 Other specified chronic obstructive pulmonary disease: Secondary | ICD-10-CM

## 2014-11-09 DIAGNOSIS — Z23 Encounter for immunization: Secondary | ICD-10-CM

## 2014-11-09 DIAGNOSIS — C641 Malignant neoplasm of right kidney, except renal pelvis: Secondary | ICD-10-CM

## 2014-11-09 DIAGNOSIS — J449 Chronic obstructive pulmonary disease, unspecified: Secondary | ICD-10-CM

## 2014-11-09 DIAGNOSIS — R0602 Shortness of breath: Secondary | ICD-10-CM | POA: Diagnosis not present

## 2014-11-09 DIAGNOSIS — I2089 Other forms of angina pectoris: Secondary | ICD-10-CM

## 2014-11-09 DIAGNOSIS — M542 Cervicalgia: Secondary | ICD-10-CM

## 2014-11-09 DIAGNOSIS — E781 Pure hyperglyceridemia: Secondary | ICD-10-CM

## 2014-11-09 DIAGNOSIS — R221 Localized swelling, mass and lump, neck: Secondary | ICD-10-CM

## 2014-11-09 MED ORDER — METOPROLOL SUCCINATE ER 25 MG PO TB24: 25.0000 mg | ORAL_TABLET | Freq: Every day | ORAL | Status: DC

## 2014-11-09 MED ORDER — NITROGLYCERIN 0.3 MG SL SUBL: 0.3000 mg | SUBLINGUAL_TABLET | SUBLINGUAL | Status: DC | PRN

## 2014-11-09 NOTE — Patient Instructions (Addendum)
You can try the nexium for reflux,  It is similar to prilosec  Mylanta Gas  (liquid may be chalky)Or gas X (pill)  will help the gas you are having.    The neck pain may be coming from arthritis in your neck,  or may be from your heart (angina)  I have ordered plain x ryas of your cervical spine to look for arthritis,  And a cardiology referral is in process to make sure it is not your heart  i am starting you on low dose metoprolol for your blood pressure.  You can take t once daily in the evenings.  I am also prescribing a medicine for your neck pain that will help if the pain is coming from your heart. Take the sublingual nitroglycerin  The next time you have an episode of neck pain that occurs with shortness of breath   I recommend Dr Melynda Keller   on [facility] for future dental work .

## 2014-11-09 NOTE — Progress Notes (Signed)
Pre-visit discussion using our clinic review tool. No additional management support is needed unless otherwise documented below in the visit note.  
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
## 2014-11-10 LAB — LDL CHOLESTEROL, DIRECT: Direct LDL: 129.7 mg/dL

## 2014-11-10 LAB — LIPID PANEL
Cholesterol: 258 mg/dL — ABNORMAL HIGH (ref 0–200)
HDL: 36.4 mg/dL — ABNORMAL LOW (ref >39.00)
NonHDL: 221.6
Total CHOL/HDL Ratio: 7
Triglycerides: 698 mg/dL — ABNORMAL HIGH (ref 0.0–149.0)
VLDL: 139.6 mg/dL — ABNORMAL HIGH (ref 0.0–40.0)

## 2014-11-10 LAB — COMPREHENSIVE METABOLIC PANEL
ALBUMIN: 4.4 g/dL (ref 3.5–5.2)
ALK PHOS: 58 U/L (ref 39–117)
ALT: 14 U/L (ref 0–35)
AST: 17 U/L (ref 0–37)
BUN: 21 mg/dL (ref 6–23)
CALCIUM: 9.4 mg/dL (ref 8.4–10.5)
CO2: 22 mEq/L (ref 19–32)
Chloride: 111 mEq/L (ref 96–112)
Creatinine, Ser: 1.1 mg/dL (ref 0.4–1.2)
GFR: 53.84 mL/min — ABNORMAL LOW (ref >60.00)
Glucose, Bld: 79 mg/dL (ref 70–99)
POTASSIUM: 4.5 meq/L (ref 3.5–5.1)
SODIUM: 140 meq/L (ref 135–145)
Total Bilirubin: 0.7 mg/dL (ref 0.2–1.2)
Total Protein: 7.8 g/dL (ref 6.0–8.3)

## 2014-11-10 LAB — TSH: TSH: 1.69 u[IU]/mL (ref 0.35–4.50)

## 2014-11-10 LAB — HEMOGLOBIN A1C: Hgb A1c MFr Bld: 5.7 % (ref 4.6–6.5)

## 2014-11-10 LAB — VITAMIN D 25 HYDROXY (VIT D DEFICIENCY, FRACTURES): VITD: 13.44 ng/mL — ABNORMAL LOW (ref 30.00–100.00)

## 2014-11-11 ENCOUNTER — Encounter: Payer: Self-pay | Admitting: Internal Medicine

## 2014-11-11 DIAGNOSIS — I208 Other forms of angina pectoris: Secondary | ICD-10-CM | POA: Insufficient documentation

## 2014-11-11 DIAGNOSIS — I2089 Other forms of angina pectoris: Secondary | ICD-10-CM | POA: Insufficient documentation

## 2014-11-11 DIAGNOSIS — M5090 Cervical disc disorder, unspecified, unspecified cervical region: Secondary | ICD-10-CM | POA: Insufficient documentation

## 2014-11-11 DIAGNOSIS — R221 Localized swelling, mass and lump, neck: Secondary | ICD-10-CM | POA: Insufficient documentation

## 2014-11-11 HISTORY — PX: CORONARY ANGIOPLASTY WITH STENT PLACEMENT: SHX49

## 2014-11-11 NOTE — Assessment & Plan Note (Addendum)
Metoprolol added today for concurrent suspicion of CAD and recurrence of chronic daily headaches

## 2014-11-11 NOTE — Assessment & Plan Note (Signed)
Spondylosis was noted on neck CT at C5-C6 July 2015,  Current symptoms may be angina, but may be spinal cord or nerve root impingement /irritation precipitated by prolonged neck extension during FNA procedure by Richardson Landry in October.

## 2014-11-11 NOTE — Assessment & Plan Note (Addendum)
Noted on left carotid bulb without stenosis on CT of neck done in July 2015 for evaluation of right sided neck mass.

## 2014-11-11 NOTE — Assessment & Plan Note (Addendum)
Suggested by history , although the temporal relationship to her recent neck biopsy may also suggest an alternative diagnosis of cervical stenosis. CRF'S include history of tobacco abuse,  New onset hypertension,  PAD noted on CT of neck, and postmenopausal state.  Cardiology referral in process.  Nitroglycerin sublingual tablets and low dose metoprolol added today.

## 2014-11-11 NOTE — Assessment & Plan Note (Signed)
Repeat triglycerides were elevated, likely because she had not been fasting for 8 hours. Low GI diet and exercise recommended, and  repeat in 3 months, but I would not start medication at this time unless cardiologic evaluation suggests heart disease.

## 2014-11-11 NOTE — Assessment & Plan Note (Signed)
She has a 1.7 cm mass by CT scan July 2015 which was first noticed a year ago in the setting of recent infection but has persisted  She has a history of tobacco abuse and history of Wilms tumor. Excisional  biopsy by ENT is  Offered since FNA was nondiagnostic, , but further evaluation  is postponed until cardiac issues have been explored.

## 2014-11-13 ENCOUNTER — Other Ambulatory Visit: Payer: Self-pay | Admitting: Internal Medicine

## 2014-11-13 MED ORDER — ERGOCALCIFEROL 1.25 MG (50000 UT) PO CAPS: 50000.0000 [IU] | ORAL_CAPSULE | ORAL | Status: DC

## 2014-11-18 ENCOUNTER — Ambulatory Visit: Payer: Medicare HMO | Admitting: Internal Medicine

## 2014-11-25 ENCOUNTER — Ambulatory Visit (INDEPENDENT_AMBULATORY_CARE_PROVIDER_SITE_OTHER)
Admission: RE | Admit: 2014-11-25 | Discharge: 2014-11-25 | Disposition: A | Discharge status: Home / Self Care | Payer: Commercial Managed Care - HMO | Source: Ambulatory Visit | Attending: Internal Medicine | Admitting: Internal Medicine

## 2014-11-25 DIAGNOSIS — M542 Cervicalgia: Secondary | ICD-10-CM | POA: Diagnosis not present

## 2014-11-25 DIAGNOSIS — M4802 Spinal stenosis, cervical region: Secondary | ICD-10-CM | POA: Diagnosis not present

## 2014-11-27 ENCOUNTER — Encounter: Payer: Self-pay | Admitting: Internal Medicine

## 2014-11-27 DIAGNOSIS — I70209 Unspecified atherosclerosis of native arteries of extremities, unspecified extremity: Secondary | ICD-10-CM | POA: Insufficient documentation

## 2014-11-27 DIAGNOSIS — M47812 Spondylosis without myelopathy or radiculopathy, cervical region: Secondary | ICD-10-CM | POA: Insufficient documentation

## 2014-11-28 ENCOUNTER — Other Ambulatory Visit: Payer: Self-pay | Admitting: Internal Medicine

## 2014-11-28 ENCOUNTER — Ambulatory Visit: Payer: Self-pay | Admitting: Cardiovascular Disease

## 2014-11-28 ENCOUNTER — Ambulatory Visit (INDEPENDENT_AMBULATORY_CARE_PROVIDER_SITE_OTHER): Payer: Commercial Managed Care - HMO | Admitting: Cardiovascular Disease

## 2014-11-28 ENCOUNTER — Encounter: Payer: Self-pay | Admitting: Cardiovascular Disease

## 2014-11-28 VITALS — BP 168/74 | HR 61 | Ht 59.0 in | Wt 136.8 lb

## 2014-11-28 DIAGNOSIS — M542 Cervicalgia: Secondary | ICD-10-CM

## 2014-11-28 DIAGNOSIS — I6522 Occlusion and stenosis of left carotid artery: Secondary | ICD-10-CM

## 2014-11-28 DIAGNOSIS — I6529 Occlusion and stenosis of unspecified carotid artery: Secondary | ICD-10-CM

## 2014-11-28 DIAGNOSIS — I208 Other forms of angina pectoris: Secondary | ICD-10-CM | POA: Diagnosis not present

## 2014-11-28 DIAGNOSIS — Z01812 Encounter for preprocedural laboratory examination: Secondary | ICD-10-CM | POA: Diagnosis not present

## 2014-11-28 DIAGNOSIS — I10 Essential (primary) hypertension: Secondary | ICD-10-CM

## 2014-11-28 DIAGNOSIS — E785 Hyperlipidemia, unspecified: Secondary | ICD-10-CM | POA: Diagnosis not present

## 2014-11-28 DIAGNOSIS — R079 Chest pain, unspecified: Secondary | ICD-10-CM

## 2014-11-28 LAB — BASIC METABOLIC PANEL
Anion Gap: 7 (ref 7–16)
BUN: 22 mg/dL — ABNORMAL HIGH (ref 7–18)
CALCIUM: 8.7 mg/dL (ref 8.5–10.1)
CO2: 25 mmol/L (ref 21–32)
Chloride: 108 mmol/L — ABNORMAL HIGH (ref 98–107)
Creatinine: 1.05 mg/dL (ref 0.60–1.30)
EGFR (Non-African Amer.): 56 — ABNORMAL LOW
Glucose: 86 mg/dL (ref 65–99)
Osmolality: 282 (ref 275–301)
POTASSIUM: 4.5 mmol/L (ref 3.5–5.1)
Sodium: 140 mmol/L (ref 136–145)

## 2014-11-28 LAB — CBC WITH DIFFERENTIAL/PLATELET
BASOS PCT: 0.7 %
Basophil #: 0.1 10*3/uL (ref 0.0–0.1)
Eosinophil #: 0.3 10*3/uL (ref 0.0–0.7)
Eosinophil %: 2.9 %
HCT: 41.6 % (ref 35.0–47.0)
HGB: 13.7 g/dL (ref 12.0–16.0)
LYMPHS ABS: 2.9 10*3/uL (ref 1.0–3.6)
Lymphocyte %: 31.4 %
MCH: 29.6 pg (ref 26.0–34.0)
MCHC: 33 g/dL (ref 32.0–36.0)
MCV: 90 fL (ref 80–100)
MONOS PCT: 10.8 %
Monocyte #: 1 x10 3/mm — ABNORMAL HIGH (ref 0.2–0.9)
Neutrophil #: 5 10*3/uL (ref 1.4–6.5)
Neutrophil %: 54.2 %
PLATELETS: 281 10*3/uL (ref 150–440)
RBC: 4.63 10*6/uL (ref 3.80–5.20)
RDW: 13.1 % (ref 11.5–14.5)
WBC: 9.3 10*3/uL (ref 3.6–11.0)

## 2014-11-28 LAB — PROTIME-INR
INR: 0.9
Prothrombin Time: 11.7 secs (ref 11.5–14.7)

## 2014-11-28 MED ORDER — ATORVASTATIN CALCIUM 40 MG PO TABS: 40.0000 mg | ORAL_TABLET | Freq: Every day | ORAL | Status: DC
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
## 2014-11-28 NOTE — Assessment & Plan Note (Signed)
I requested carotid Doppler to ensure no obstructive disease.

## 2014-11-28 NOTE — Patient Instructions (Addendum)
[facility] Cardiac Cath Instructions   You are scheduled for a Cardiac Cath on:___1/21/16______________________  Please arrive at 0630 am on the day of your procedure  You will need to pre-register prior to the day of your procedure.  Enter through the [employer] at [facility].  Registration is the first desk on your right.  Please take the procedure order we have given you in order to be registered appropriately  Do not eat/drink anything after midnight  Someone will need to drive you home  It is recommended someone be with you for the first 24 hours after your procedure  Wear clothes that are easy to get on/off and wear slip on shoes if possible   Medications bring a current list of all medications with you  __x_ You may take all of your medications the morning of your procedure with enough water to swallow safely    Day of your procedure: Arrive at the [place] entrance.  Free valet service is available.  After entering the [place] please check-in at the registration desk (1st desk on your right) to receive your armband. After receiving your armband someone will escort you to the cardiac cath/special procedures waiting area.  The usual length of stay after your procedure is about 2 to 3 hours.  This can vary.  If you have any questions, please call our office at [phone], or you may call the cardiac cath lab at [facility] directly at [phone]  Your physician has requested that you have a carotid duplex. This test is an ultrasound of the carotid arteries in your neck. It looks at blood flow through these arteries that supply the brain with blood. Allow one hour for this exam. There are no restrictions or special instructions.   Your physician has recommended you make the following change in your medication: Restart Aspirin 81 mg once daily  Start Atorvastatin 40 mg once daily   Your physician recommends that you have labs today:  CBC  BMP INR

## 2014-11-28 NOTE — Assessment & Plan Note (Signed)
The patient current symptoms of substernal chest and neck tightness with activities is consistent with class III angina in spite of therapy with a beta blocker. I discussed different management options with the patient including proceeding first with a stress test versus directly with cardiac catheterization. I favor the second option given the high pre test probability of obstructive coronary artery disease. I discussed the procedure in details as well as risks and benefits and she wants to proceed. I asked her to continue to take aspirin 81 mg once daily.

## 2014-11-28 NOTE — Assessment & Plan Note (Signed)
Lab Results  Component Value Date   CHOL 258* 11/09/2014   HDL 36.40* 11/09/2014   LDLCALC 111* 05/26/2014   LDLDIRECT 129.7 11/09/2014   TRIG * 11/09/2014    698.0 Triglyceride is over 400; calculations on Lipids are invalid.   CHOLHDL 7 11/09/2014   Given the presence of carotid atherosclerosis and suspected coronary artery disease, I started treatment with atorvastatin 40 mg once daily.

## 2014-11-28 NOTE — Assessment & Plan Note (Signed)
Blood pressure is mildly elevated but she appears to be anxious. Continue to monitor.

## 2014-11-29 ENCOUNTER — Telehealth: Payer: Self-pay | Admitting: Internal Medicine

## 2014-11-29 ENCOUNTER — Other Ambulatory Visit: Payer: Self-pay | Admitting: Internal Medicine

## 2014-11-29 NOTE — Telephone Encounter (Signed)
Last visit 11/09/14

## 2014-11-29 NOTE — Telephone Encounter (Signed)
emmi emailed °

## 2014-11-29 NOTE — Telephone Encounter (Signed)
The patient called hoping to get an update on this medication refill.

## 2014-11-29 NOTE — Telephone Encounter (Signed)
Ok to fill 

## 2014-11-30 ENCOUNTER — Other Ambulatory Visit: Payer: Self-pay | Admitting: Internal Medicine

## 2014-11-30 ENCOUNTER — Telehealth: Payer: Self-pay | Admitting: *Deleted

## 2014-11-30 MED ORDER — GABAPENTIN 300 MG PO CAPS: 300.0000 mg | ORAL_CAPSULE | Freq: Two times a day (BID) | ORAL | Status: DC

## 2014-11-30 MED ORDER — HYDROCODONE-ACETAMINOPHEN 5-325 MG PO TABS: 1.0000 | ORAL_TABLET | Freq: Four times a day (QID) | ORAL | Status: DC | PRN

## 2014-11-30 NOTE — Telephone Encounter (Signed)
Gabapentin sent by e mail

## 2014-11-30 NOTE — Telephone Encounter (Signed)
Ok to refill,  printed rx  

## 2014-11-30 NOTE — Telephone Encounter (Signed)
Patient called again for this prescription refill.  She said she is going into the hospital tomorrow for Cardiac Cath surgery and she needs the meds for when she's released from the hospital.

## 2014-11-30 NOTE — Telephone Encounter (Signed)
Cath orders faxed to cath lab  Santiago Glad conformed receipt

## 2014-12-01 ENCOUNTER — Ambulatory Visit: Payer: Self-pay | Admitting: Cardiovascular Disease

## 2014-12-01 DIAGNOSIS — E785 Hyperlipidemia, unspecified: Secondary | ICD-10-CM | POA: Diagnosis not present

## 2014-12-01 DIAGNOSIS — I10 Essential (primary) hypertension: Secondary | ICD-10-CM | POA: Diagnosis not present

## 2014-12-01 DIAGNOSIS — I6522 Occlusion and stenosis of left carotid artery: Secondary | ICD-10-CM | POA: Diagnosis not present

## 2014-12-01 DIAGNOSIS — Z8249 Family history of ischemic heart disease and other diseases of the circulatory system: Secondary | ICD-10-CM | POA: Diagnosis not present

## 2014-12-01 DIAGNOSIS — I2582 Chronic total occlusion of coronary artery: Secondary | ICD-10-CM | POA: Diagnosis not present

## 2014-12-01 DIAGNOSIS — Z87891 Personal history of nicotine dependence: Secondary | ICD-10-CM | POA: Diagnosis not present

## 2014-12-01 DIAGNOSIS — I25118 Atherosclerotic heart disease of native coronary artery with other forms of angina pectoris: Secondary | ICD-10-CM

## 2014-12-01 DIAGNOSIS — I25110 Atherosclerotic heart disease of native coronary artery with unstable angina pectoris: Secondary | ICD-10-CM | POA: Diagnosis not present

## 2014-12-01 HISTORY — PX: CORONARY ANGIOPLASTY WITH STENT PLACEMENT: SHX49

## 2014-12-01 LAB — CK TOTAL AND CKMB (NOT AT ARMC)
CK, Total: 123 U/L (ref 26–192)
CK-MB: 4.4 ng/mL — ABNORMAL HIGH (ref 0.5–3.6)

## 2014-12-01 NOTE — Telephone Encounter (Signed)
See other refill request, has been approved.

## 2014-12-01 NOTE — Telephone Encounter (Signed)
Script placed at front desk patient notified.

## 2014-12-02 ENCOUNTER — Other Ambulatory Visit: Payer: Self-pay | Admitting: Physician Assistant

## 2014-12-02 DIAGNOSIS — I2582 Chronic total occlusion of coronary artery: Secondary | ICD-10-CM | POA: Diagnosis not present

## 2014-12-02 DIAGNOSIS — I25110 Atherosclerotic heart disease of native coronary artery with unstable angina pectoris: Secondary | ICD-10-CM | POA: Diagnosis not present

## 2014-12-02 DIAGNOSIS — Z8249 Family history of ischemic heart disease and other diseases of the circulatory system: Secondary | ICD-10-CM | POA: Diagnosis not present

## 2014-12-02 DIAGNOSIS — I2510 Atherosclerotic heart disease of native coronary artery without angina pectoris: Secondary | ICD-10-CM

## 2014-12-02 DIAGNOSIS — I25810 Atherosclerosis of coronary artery bypass graft(s) without angina pectoris: Secondary | ICD-10-CM

## 2014-12-02 DIAGNOSIS — I10 Essential (primary) hypertension: Secondary | ICD-10-CM | POA: Diagnosis not present

## 2014-12-02 DIAGNOSIS — E785 Hyperlipidemia, unspecified: Secondary | ICD-10-CM | POA: Diagnosis not present

## 2014-12-02 DIAGNOSIS — Z87891 Personal history of nicotine dependence: Secondary | ICD-10-CM | POA: Diagnosis not present

## 2014-12-02 DIAGNOSIS — I6522 Occlusion and stenosis of left carotid artery: Secondary | ICD-10-CM | POA: Diagnosis not present

## 2014-12-02 LAB — BASIC METABOLIC PANEL
ANION GAP: 8 (ref 7–16)
BUN: 13 mg/dL (ref 4–21)
BUN: 13 mg/dL (ref 7–18)
CHLORIDE: 110 mmol/L — AB (ref 98–107)
CO2: 24 mmol/L (ref 21–32)
Calcium, Total: 8.9 mg/dL (ref 8.5–10.1)
Creatinine: 1 mg/dL (ref 0.5–1.1)
Creatinine: 1.03 mg/dL (ref 0.60–1.30)
EGFR (African American): >60
EGFR (Non-African Amer.): 58 — ABNORMAL LOW
Glucose: 103 mg/dL — ABNORMAL HIGH (ref 65–99)
Osmolality: 283 (ref 275–301)
POTASSIUM: 3.9 mmol/L (ref 3.4–5.3)
Potassium: 3.9 mmol/L (ref 3.5–5.1)
SODIUM: 142 mmol/L (ref 136–145)
Sodium: 142 mmol/L (ref 137–147)

## 2014-12-02 MED ORDER — LISINOPRIL 20 MG PO TABS: 20.0000 mg | ORAL_TABLET | Freq: Every day | ORAL | Status: DC

## 2014-12-02 MED ORDER — PANTOPRAZOLE SODIUM 40 MG PO TBEC: 40.0000 mg | DELAYED_RELEASE_TABLET | Freq: Every day | ORAL | Status: DC

## 2014-12-02 MED ORDER — CLOPIDOGREL BISULFATE 75 MG PO TABS: 75.0000 mg | ORAL_TABLET | Freq: Every day | ORAL | Status: DC

## 2014-12-05 MED ORDER — GABAPENTIN 300 MG PO CAPS: 300.0000 mg | ORAL_CAPSULE | Freq: Two times a day (BID) | ORAL | Status: DC

## 2014-12-05 NOTE — Addendum Note (Signed)
Addended by: [name] on: 12/05/2014 10:27 AM   Modules accepted: Orders

## 2014-12-06 ENCOUNTER — Encounter (INDEPENDENT_AMBULATORY_CARE_PROVIDER_SITE_OTHER): Payer: Medicare HMO

## 2014-12-06 DIAGNOSIS — I6529 Occlusion and stenosis of unspecified carotid artery: Secondary | ICD-10-CM

## 2014-12-06 DIAGNOSIS — I6522 Occlusion and stenosis of left carotid artery: Secondary | ICD-10-CM

## 2014-12-06 DIAGNOSIS — M542 Cervicalgia: Secondary | ICD-10-CM

## 2014-12-08 DIAGNOSIS — Z1231 Encounter for screening mammogram for malignant neoplasm of breast: Secondary | ICD-10-CM | POA: Diagnosis not present

## 2014-12-13 ENCOUNTER — Encounter: Payer: Self-pay | Admitting: Cardiovascular Disease

## 2014-12-19 ENCOUNTER — Ambulatory Visit (INDEPENDENT_AMBULATORY_CARE_PROVIDER_SITE_OTHER): Payer: Commercial Managed Care - HMO | Admitting: Internal Medicine

## 2014-12-19 ENCOUNTER — Encounter: Payer: Self-pay | Admitting: Internal Medicine

## 2014-12-19 VITALS — BP 148/80 | HR 62 | Temp 97.9°F | Resp 16 | Ht 59.0 in | Wt 133.5 lb

## 2014-12-19 DIAGNOSIS — R221 Localized swelling, mass and lump, neck: Secondary | ICD-10-CM

## 2014-12-19 DIAGNOSIS — I208 Other forms of angina pectoris: Secondary | ICD-10-CM

## 2014-12-19 DIAGNOSIS — I2089 Other forms of angina pectoris: Secondary | ICD-10-CM

## 2014-12-19 DIAGNOSIS — E559 Vitamin D deficiency, unspecified: Secondary | ICD-10-CM

## 2014-12-19 DIAGNOSIS — I6522 Occlusion and stenosis of left carotid artery: Secondary | ICD-10-CM | POA: Diagnosis not present

## 2014-12-19 DIAGNOSIS — I70209 Unspecified atherosclerosis of native arteries of extremities, unspecified extremity: Secondary | ICD-10-CM

## 2014-12-19 DIAGNOSIS — I10 Essential (primary) hypertension: Secondary | ICD-10-CM | POA: Diagnosis not present

## 2014-12-19 DIAGNOSIS — M810 Age-related osteoporosis without current pathological fracture: Secondary | ICD-10-CM

## 2014-12-19 DIAGNOSIS — E785 Hyperlipidemia, unspecified: Secondary | ICD-10-CM

## 2014-12-19 DIAGNOSIS — Z79891 Long term (current) use of opiate analgesic: Secondary | ICD-10-CM | POA: Diagnosis not present

## 2014-12-19 DIAGNOSIS — I6529 Occlusion and stenosis of unspecified carotid artery: Secondary | ICD-10-CM | POA: Insufficient documentation

## 2014-12-19 DIAGNOSIS — Z79899 Other long term (current) drug therapy: Secondary | ICD-10-CM

## 2014-12-19 MED ORDER — ALENDRONATE SODIUM 70 MG PO TABS: 70.0000 mg | ORAL_TABLET | ORAL | Status: DC

## 2014-12-19 NOTE — Progress Notes (Signed)
Pre-visit discussion using our clinic review tool. No additional management support is needed unless otherwise documented below in the visit note.  

## 2014-12-19 NOTE — Progress Notes (Signed)
Patient ID: Suzanne Cole, female   DOB: 01-Dec-1950, 64 y.o.   MRN: [id number]   Subjective:     Suzanne Cole is a 64 y.o. female and is here for a comprehensive physical exam and follow up on recent hospitalization Jan 22.  She is S/P PTCA /DES OF 70% LAD LESION WITH TIMI3 FLOW RESTORED ON JAN 22.  Patient was referred to Dr Fletcher Anon for evaluation of exertional neck pain described at last visit during follow up of gradually shrinking right sided neck mass that was biopsied by ENT several months prior.   Her neck pain was suspcious for angina and has resolved since undergoing the PTCA.  She is tolerating Plavix without excessive bleeding but has had some minor  bruising and  has persistent mild discomfort in her right wrist at the site of the radial artery that was punctured for her catheterization.  Denies redness and swelling of arm. Workup also included carotid dopplers which noted noncrital stenosis which will need surveillance,  She is also tolerating the statin and other medication changes that occurred as a result of the new diagnosis of CAD.         History   Social History  . Marital Status: Married    Spouse Name: N/A    Number of Children: N/A  . Years of Education: N/A   Occupational History  . Not on file.   Social History Main Topics  . Smoking status: Former Smoker    Types: Cigars    Quit date: 10/11/2010  . Smokeless tobacco: Never Used  . Alcohol Use: 0.0 oz/week    0 Not specified per week  . Drug Use: No  . Sexual Activity: Yes   Other Topics Concern  . Not on file   Social History Narrative   Health Maintenance  Topic Date Due  . PAP SMEAR  01/26/2013  . COLONOSCOPY  10/14/2014  . INFLUENZA VACCINE  06/12/2015  . MAMMOGRAM  12/01/2015  . TETANUS/TDAP  08/20/2023  . ZOSTAVAX  Completed    The following portions of the patient's history were reviewed and updated as appropriate: allergies, current medications, past family history, past medical history, past  social history, past surgical history and problem list.  Review of Systems  Patient denies headache, fevers, malaise, unintentional weight loss, skin rash, eye pain, sinus congestion and sinus pain, sore throat, dysphagia,  hemoptysis , cough, dyspnea, wheezing, chest pain, palpitations, orthopnea, edema, abdominal pain, nausea, melena, diarrhea, constipation, flank pain, dysuria, hematuria, urinary  Frequency, nocturia, numbness, tingling, seizures,  Focal weakness, Loss of consciousness,  Tremor, insomnia, depression, anxiety, and suicidal ideation.     Objective:   BP 148/80 mmHg  Pulse 62  Temp(Src) 97.9 F (36.6 C) (Oral)  Resp 16  Ht 4\' 11"  (1.499 m)  Wt 133 lb 8 oz (60.555 kg)  BMI 26.95 kg/m2  SpO2 95%  General appearance: alert, cooperative and appears stated age Head: Normocephalic, without obvious abnormality, atraumatic Eyes: conjunctivae/corneas clear. PERRL, EOM's intact. Fundi benign. Ears: normal TM's and external ear canals both ears Nose: Nares normal. Septum midline. Mucosa normal. No drainage or sinus tenderness. Throat: lips, mucosa, and tongue normal; teeth and gums normal Neck: no adenopathy, no carotid bruit, no JVD, supple, symmetrical, trachea midline and thyroid not enlarged, symmetric, no tenderness/mass/nodules Lungs: clear to auscultation bilaterally Breasts: normal appearance, no masses or tenderness Heart: regular rate and rhythm, S1, S2 normal, no murmur, click, rub or gallop Abdomen: soft, non-tender; bowel sounds normal; no masses,  no organomegaly Extremities: . Right forearm is normal, atraumatic, pulse normal ,  no cyanosis or edema Pulses: 2+ and symmetric Skin: Skin color, texture, turgor normal. No rashes or lesions Neurologic: Alert and oriented X 3, normal strength and tone. Normal symmetric reflexes. Normal coordination and gait.   .    Assessment and Plan:    Problem List Items Addressed This Visit    Osteoporosis    Found during  prior annual screening by DEXA but no therapy to date.  Discussed today,  She is willing to start alendronate.  Will check Vit d with next lab draw. Given history of Vit D insufficiency.       Relevant Medications   ALENDRONATE SODIUM 70 MG PO TABS   Other Relevant Orders   TSH   Mass of right side of neck    Biopsy was done several months ago and no malignant cells were found.  The mass is shrinking,  She has been advised to follow up with Dr. Richardson Landry as directed.       Hypertension    Well controlled on current regime of ACE I and beta blocker.        Relevant Orders   CBC with Differential/Platelet   Hyperlipidemia LDL goal <70    High potency statin was started during  Diagnosis of CAD.  She is tolerating Lipitor and has been asked to return in 6 weeks for CMET and fasting lipid panel.       Relevant Orders   Comprehensive metabolic panel   Lipid panel   TSH   Atherosclerotic peripheral vascular disease    Confirmed on carotid dopplers.Statin started.  Continue current meds and annual surveillance.       Atherosclerosis of left carotid artery    Reviewed results of recent testng with patient.  Advised to continue tobacco abstinene,  Statin, asa and Plavix,  And repeat carotids in one year      Relevant Orders   Lipid panel   Asymptomatic carotid artery stenosis without infarction - Primary   Angina of effort    She has had no recurrent angina since PTCA.        Other Visit Diagnoses    Vitamin D deficiency        Relevant Orders    Vit D  25 hydroxy (rtn osteoporosis monitoring)    Long-term use of high-risk medication        Relevant Orders    CBC with Differential/Platelet    Comprehensive metabolic panel      A total of 40 minutes was spent with patient more than half of which was spent in counseling patient on the above mentioned issues , reviewing and explaining recent labs and imaging studies done, and coordination of care.

## 2014-12-19 NOTE — Patient Instructions (Signed)
I am trreating you for osteoporosis with generic Fosomax (alendronate.  Take it t once week in the morning .  I recommend getting the half of your calcium and Vitamin D  Requirements through dietary sources rather than supplements given the recent association of calcium supplements with increased coronary artery calcium scores (You need 1800 mg daily )   You need 1800 mg of calcium daily  Unsweetened almond/coconut milk, Cashew and soy  Milks are all great low calorie low carb, cholesterol free sources of dietary calcium and vitamin D.    Return for fasting labs in early April.  Follow up with me in August    We will repeat your  DEXA in one year   Health Maintenance Adopting a healthy lifestyle and getting preventive care can go a long way to promote health and wellness. Talk with your health care provider about what schedule of regular examinations is right for you. This is a good chance for you to check in with your provider about disease prevention and staying healthy. In between checkups, there are plenty of things you can do on your own. Experts have done a lot of research about which lifestyle changes and preventive measures are most likely to keep you healthy. Ask your health care provider for more information. WEIGHT AND DIET  Eat a healthy diet  Be sure to include plenty of vegetables, fruits, low-fat dairy products, and lean protein.  Do not eat a lot of foods high in solid fats, added sugars, or salt.  Get regular exercise. This is one of the most important things you can do for your health.  Most adults should exercise for at least 150 minutes each week. The exercise should increase your heart rate and make you sweat (moderate-intensity exercise).  Most adults should also do strengthening exercises at least twice a week. This is in addition to the moderate-intensity exercise.  Maintain a healthy weight  Body mass index (BMI) is a measurement that can be used to identify  possible weight problems. It estimates body fat based on height and weight. Your health care provider can help determine your BMI and help you achieve or maintain a healthy weight.  For females 49 years of age and older:   A BMI below 18.5 is considered underweight.  A BMI of 18.5 to 24.9 is normal.  A BMI of 25 to 29.9 is considered overweight.  A BMI of 30 and above is considered obese.  Watch levels of cholesterol and blood lipids  You should start having your blood tested for lipids and cholesterol at 64 years of age, then have this test every 5 years.  You may need to have your cholesterol levels checked more often if:  Your lipid or cholesterol levels are high.  You are older than 64 years of age.  You are at high risk for heart disease.  CANCER SCREENING   Lung Cancer  Lung cancer screening is recommended for adults 85-24 years old who are at high risk for lung cancer because of a history of smoking.  A yearly low-dose CT scan of the lungs is recommended for people who:  Currently smoke.  Have quit within the past 15 years.  Have at least a 30-pack-year history of smoking. A pack year is smoking an average of one pack of cigarettes a day for 1 year.  Yearly screening should continue until it has been 15 years since you quit.  Yearly screening should stop if you develop a health  problem that would prevent you from having lung cancer treatment.  Breast Cancer  Practice breast self-awareness. This means understanding how your breasts normally appear and feel.  It also means doing regular breast self-exams. Let your health care provider know about any changes, no matter how small.  If you are in your 20s or 30s, you should have a clinical breast exam (CBE) by a health care provider every 1-3 years as part of a regular health exam.  If you are 41 or older, have a CBE every year. Also consider having a breast X-ray (mammogram) every year.  If you have a family  history of breast cancer, talk to your health care provider about genetic screening.  If you are at high risk for breast cancer, talk to your health care provider about having an MRI and a mammogram every year.  Breast cancer gene (BRCA) assessment is recommended for women who have family members with BRCA-related cancers. BRCA-related cancers include:  Breast.  Ovarian.  Tubal.  Peritoneal cancers.  Results of the assessment will determine the need for genetic counseling and BRCA1 and BRCA2 testing. Cervical Cancer Routine pelvic examinations to screen for cervical cancer are no longer recommended for nonpregnant women who are considered low risk for cancer of the pelvic organs (ovaries, uterus, and vagina) and who do not have symptoms. A pelvic examination may be necessary if you have symptoms including those associated with pelvic infections. Ask your health care provider if a screening pelvic exam is right for you.   The Pap test is the screening test for cervical cancer for women who are considered at risk.  If you had a hysterectomy for a problem that was not cancer or a condition that could lead to cancer, then you no longer need Pap tests.  If you are older than 65 years, and you have had normal Pap tests for the past 10 years, you no longer need to have Pap tests.  If you have had past treatment for cervical cancer or a condition that could lead to cancer, you need Pap tests and screening for cancer for at least 20 years after your treatment.  If you no longer get a Pap test, assess your risk factors if they change (such as having a new sexual partner). This can affect whether you should start being screened again.  Some women have medical problems that increase their chance of getting cervical cancer. If this is the case for you, your health care provider may recommend more frequent screening and Pap tests.  The human papillomavirus (HPV) test is another test that may be used  for cervical cancer screening. The HPV test looks for the virus that can cause cell changes in the cervix. The cells collected during the Pap test can be tested for HPV.  The HPV test can be used to screen women 68 years of age and older. Getting tested for HPV can extend the interval between normal Pap tests from three to five years.  An HPV test also should be used to screen women of any age who have unclear Pap test results.  After 64 years of age, women should have HPV testing as often as Pap tests.  Colorectal Cancer  This type of cancer can be detected and often prevented.  Routine colorectal cancer screening usually begins at 64 years of age and continues through 64 years of age.  Your health care provider may recommend screening at an earlier age if you have risk factors for  colon cancer.  Your health care provider may also recommend using home test kits to check for hidden blood in the stool.  A small camera at the end of a tube can be used to examine your colon directly (sigmoidoscopy or colonoscopy). This is done to check for the earliest forms of colorectal cancer.  Routine screening usually begins at age 79.  Direct examination of the colon should be repeated every 5-10 years through 64 years of age. However, you may need to be screened more often if early forms of precancerous polyps or small growths are found. Skin Cancer  Check your skin from head to toe regularly.  Tell your health care provider about any new moles or changes in moles, especially if there is a change in a mole's shape or color.  Also tell your health care provider if you have a mole that is larger than the size of a pencil eraser.  Always use sunscreen. Apply sunscreen liberally and repeatedly throughout the day.  Protect yourself by wearing long sleeves, pants, a wide-brimmed hat, and sunglasses whenever you are outside. HEART DISEASE, DIABETES, AND HIGH BLOOD PRESSURE   Have your blood pressure  checked at least every 1-2 years. High blood pressure causes heart disease and increases the risk of stroke.  If you are between 17 years and 32 years old, ask your health care provider if you should take aspirin to prevent strokes.  Have regular diabetes screenings. This involves taking a blood sample to check your fasting blood sugar level.  If you are at a normal weight and have a low risk for diabetes, have this test once every three years after 64 years of age.  If you are overweight and have a high risk for diabetes, consider being tested at a younger age or more often. PREVENTING INFECTION  Hepatitis B  If you have a higher risk for hepatitis B, you should be screened for this virus. You are considered at high risk for hepatitis B if:  You were born in a country where hepatitis B is common. Ask your health care provider which countries are considered high risk.  Your parents were born in a high-risk country, and you have not been immunized against hepatitis B (hepatitis B vaccine).  You have HIV or AIDS.  You use needles to inject street drugs.  You live with someone who has hepatitis B.  You have had sex with someone who has hepatitis B.  You get hemodialysis treatment.  You take certain medicines for conditions, including cancer, organ transplantation, and autoimmune conditions. Hepatitis C  Blood testing is recommended for:  Everyone born from [birth year] through 1965.  Anyone with known risk factors for hepatitis C. Sexually transmitted infections (STIs)  You should be screened for sexually transmitted infections (STIs) including gonorrhea and chlamydia if:  You are sexually active and are younger than 64 years of age.  You are older than 64 years of age and your health care provider tells you that you are at risk for this type of infection.  Your sexual activity has changed since you were last screened and you are at an increased risk for chlamydia or gonorrhea. Ask  your health care provider if you are at risk.  If you do not have HIV, but are at risk, it may be recommended that you take a prescription medicine daily to prevent HIV infection. This is called pre-exposure prophylaxis (PrEP). You are considered at risk if:  You are sexually active and do  not regularly use condoms or know the HIV status of your partner(s).  You take drugs by injection.  You are sexually active with a partner who has HIV. Talk with your health care provider about whether you are at high risk of being infected with HIV. If you choose to begin PrEP, you should first be tested for HIV. You should then be tested every 3 months for as long as you are taking PrEP.  PREGNANCY   If you are premenopausal and you may become pregnant, ask your health care provider about preconception counseling.  If you may become pregnant, take 400 to 800 micrograms (mcg) of folic acid every day.  If you want to prevent pregnancy, talk to your health care provider about birth control (contraception). OSTEOPOROSIS AND MENOPAUSE   Osteoporosis is a disease in which the bones lose minerals and strength with aging. This can result in serious bone fractures. Your risk for osteoporosis can be identified using a bone density scan.  If you are 75 years of age or older, or if you are at risk for osteoporosis and fractures, ask your health care provider if you should be screened.  Ask your health care provider whether you should take a calcium or vitamin D supplement to lower your risk for osteoporosis.  Menopause may have certain physical symptoms and risks.  Hormone replacement therapy may reduce some of these symptoms and risks. Talk to your health care provider about whether hormone replacement therapy is right for you.  HOME CARE INSTRUCTIONS   Schedule regular health, dental, and eye exams.  Stay current with your immunizations.   Do not use any tobacco products including cigarettes, chewing  tobacco, or electronic cigarettes.  If you are pregnant, do not drink alcohol.  If you are breastfeeding, limit how much and how often you drink alcohol.  Limit alcohol intake to no more than 1 drink per day for nonpregnant women. One drink equals 12 ounces of beer, 5 ounces of wine, or 1 ounces of hard liquor.  Do not use street drugs.  Do not share needles.  Ask your health care provider for help if you need support or information about quitting drugs.  Tell your health care provider if you often feel depressed.  Tell your health care provider if you have ever been abused or do not feel safe at home. Document Released: 05/13/2011 Document Revised: 03/14/2014 Document Reviewed: 09/29/2013 [facility] Patient Information 2015 [place], [place]. This information is not intended to replace advice given to you by your health care provider. Make sure you discuss any questions you have with your health care provider.

## 2014-12-20 ENCOUNTER — Encounter: Payer: Self-pay | Admitting: Internal Medicine

## 2014-12-20 DIAGNOSIS — M810 Age-related osteoporosis without current pathological fracture: Secondary | ICD-10-CM | POA: Insufficient documentation

## 2014-12-20 NOTE — Assessment & Plan Note (Signed)
Well controlled on current regime of ACE I and beta blocker.

## 2014-12-20 NOTE — Assessment & Plan Note (Addendum)
Found during prior annual screening by DEXA but no therapy to date.  Discussed today,  She is willing to start alendronate.  Will check Vit d with next lab draw. Given history of Vit D insufficiency.

## 2014-12-20 NOTE — Assessment & Plan Note (Signed)
Biopsy was done several months ago and no malignant cells were found.  The mass is shrinking,  She has been advised to follow up with Dr. Richardson Landry as directed.

## 2014-12-20 NOTE — Assessment & Plan Note (Signed)
Confirmed on carotid dopplers.Statin started.  Continue current meds and annual surveillance.

## 2014-12-20 NOTE — Assessment & Plan Note (Signed)
High potency statin was started during  Diagnosis of CAD.  She is tolerating Lipitor and has been asked to return in 6 weeks for CMET and fasting lipid panel.

## 2014-12-20 NOTE — Assessment & Plan Note (Signed)
Reviewed results of recent testng with patient.  Advised to continue tobacco abstinene,  Statin, asa and Plavix,  And repeat carotids in one year

## 2014-12-20 NOTE — Assessment & Plan Note (Signed)
She has had no recurrent angina since PTCA.

## 2014-12-27 ENCOUNTER — Encounter: Payer: Self-pay | Admitting: Internal Medicine

## 2014-12-30 ENCOUNTER — Encounter: Payer: Self-pay | Admitting: Cardiovascular Disease

## 2014-12-30 ENCOUNTER — Ambulatory Visit (INDEPENDENT_AMBULATORY_CARE_PROVIDER_SITE_OTHER): Payer: Commercial Managed Care - HMO | Admitting: Cardiovascular Disease

## 2014-12-30 VITALS — BP 120/60 | HR 60 | Ht 59.0 in | Wt 132.5 lb

## 2014-12-30 DIAGNOSIS — I6522 Occlusion and stenosis of left carotid artery: Secondary | ICD-10-CM

## 2014-12-30 DIAGNOSIS — I2510 Atherosclerotic heart disease of native coronary artery without angina pectoris: Secondary | ICD-10-CM

## 2014-12-30 DIAGNOSIS — I10 Essential (primary) hypertension: Secondary | ICD-10-CM

## 2014-12-30 DIAGNOSIS — E785 Hyperlipidemia, unspecified: Secondary | ICD-10-CM

## 2014-12-30 DIAGNOSIS — R12 Heartburn: Secondary | ICD-10-CM

## 2014-12-30 MED ORDER — RANITIDINE HCL 150 MG PO TABS: 150.0000 mg | ORAL_TABLET | Freq: Two times a day (BID) | ORAL | Status: DC

## 2014-12-30 NOTE — Patient Instructions (Signed)
Start Ranitidine 150 mg twice daily.  Continue other medications.   Refer to cardiac rehab at [facility].   Follow up in 3 months.
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
## 2014-12-31 ENCOUNTER — Encounter: Payer: Self-pay | Admitting: Cardiovascular Disease

## 2014-12-31 DIAGNOSIS — I2510 Atherosclerotic heart disease of native coronary artery without angina pectoris: Secondary | ICD-10-CM | POA: Insufficient documentation

## 2014-12-31 NOTE — Assessment & Plan Note (Signed)
Blood pressure is well controlled on current medications. 

## 2014-12-31 NOTE — Assessment & Plan Note (Addendum)
The patient is doing well after recent angioplasty and drug-eluting stent placement to the mid LAD. Continue dual antiplatelet therapy for at least 12 months. I referred her to cardiac rehabilitation and discussed the importance of lifestyle changes. I will consider obtaining an echocardiogram in the near future  given that left ventricular angiography was not performed. Her RCA is chronically occluded with good left-to-right collaterals. She is having increased GERD symptoms since she was started on Plavix. Thus, I added ranitidine to Protonix.

## 2014-12-31 NOTE — Assessment & Plan Note (Signed)
She was started on atorvastatin. Recommend a target LDL of less than 70.

## 2014-12-31 NOTE — Assessment & Plan Note (Signed)
Carotid Doppler showed 40-59% of the left carotid artery. Continue treatment of risk factors and recommend a follow-up carotid Doppler in 1 year.

## 2015-02-02 ENCOUNTER — Telehealth: Payer: Self-pay | Admitting: *Deleted

## 2015-02-02 ENCOUNTER — Encounter: Payer: Self-pay | Admitting: *Deleted

## 2015-02-02 NOTE — Telephone Encounter (Signed)
Cardiac rehab orders faxed 3/24

## 2015-02-06 ENCOUNTER — Other Ambulatory Visit: Payer: Self-pay

## 2015-02-06 NOTE — Telephone Encounter (Signed)
Patient called requesting a refill on Hyrocodone-acetamiophen Rx. Patient last seen in office on 12/19/14 and medication last filled 11/30/14. Please advise.

## 2015-02-07 ENCOUNTER — Other Ambulatory Visit: Payer: Self-pay | Admitting: Internal Medicine

## 2015-02-07 MED ORDER — HYDROCODONE-ACETAMINOPHEN 5-325 MG PO TABS: 1.0000 | ORAL_TABLET | Freq: Four times a day (QID) | ORAL | Status: DC | PRN

## 2015-02-07 NOTE — Telephone Encounter (Signed)
Patient requesting refill on Hydrocodone last filled on 11/30/14. OK to fill

## 2015-02-07 NOTE — Telephone Encounter (Signed)
90 day supply authorized and printed  

## 2015-02-07 NOTE — Telephone Encounter (Signed)
Patient notified and script placed at front desk. 

## 2015-02-09 ENCOUNTER — Encounter: Payer: Self-pay | Admitting: *Deleted

## 2015-02-16 NOTE — Telephone Encounter (Signed)
This encounter was created in error - please disregard.

## 2015-02-17 ENCOUNTER — Encounter: Payer: Self-pay | Admitting: Internal Medicine

## 2015-03-12 NOTE — Discharge Summary (Signed)
Dates of Admission and Diagnosis:  Date of Admission 01-Dec-2014   Date of Discharge 02-Dec-2014   Admitting Diagnosis Unstable angina   Final Diagnosis CAD s/p PCI to mLAD   Discharge Diagnosis 1 HTN   2 HLD   3 History of tobacco abuse   4 GERD    Chief Complaint/History of Present Illness This is a pleasant 64 year old female who was referred to Dr. Fletcher Anon for evaluation of exertional chest pain on 11/28/2014. She has no previous cardiac history at that time. She has known history of hypertension, hyperlipidemia and previous tobacco use. She was diagnosed with a right sided neck mass in the summer time. She had a biopsy done a few months ago. She started having upper chest pain associated with neck pain described as tightness and heavy feeling. This only happens with physical activities and is currently happening with regular activities of daily living. Lasts for a few minutes and resolves with resting. It is associated with significant exertional dyspnea. She does have known history of GERD but current symptoms are different from her heartburn. Also the pattern is happening only with activities. She was noted to have carotid atherosclerosis on x-ray imaging (she is scheduled for carotid work up 1/26). She has no previous history of stroke. There is family history of coronary artery disease involving her father but she does not know the details. In the office her EKG showed NSR, 61 bpm, no st/t changes. She was scheduled for cardiac cath on 12/01/2014 given her above symptoms.   Allergies:  Codeine: Itching  Penicillin: Rash  Cardiology:  21-Jan-16 09:36   Ventricular Rate 60  Atrial Rate 60  P-R Interval 164  QRS Duration 82  QT 424  QTc 424  P Axis 62  R Axis 21  T Axis 50  ECG interpretation Normal sinus rhythm Normal ECG No previous ECGs available ----------unconfirmed---------- Confirmed by OVERREAD, NOT ([id number]), editor PEARSON, BARBARA ([id number]) on 12/01/2014 10:45:23 AM   Routine Chem:  22-Jan-16 04:46   Glucose, Serum  103  BUN 13  Creatinine (comp) 1.03  Sodium, Serum 142  Potassium, Serum 3.9  Chloride, Serum  110  CO2, Serum 24  Calcium (Total), Serum 8.9  Anion Gap 8  Osmolality (calc) 283  eGFR (African American) >60  eGFR (Non-African American)  58 (eGFR values <51m/min/1.73 m2 may be an indication of chronic kidney disease (CKD). Calculated eGFR, using the MRDR Study equation, is useful in  patients with stable renal function. The eGFR calculation will not be reliable in acutely ill patients when serum creatinine is changing rapidly. It is not useful in patients on dialysis. The eGFR calculation may not be applicable to patients at the low and high extremes of body sizes, pregnant women, and vegetarians.)  Cardiac:  21-Jan-16 21:50   CK, Total 123  CPK-MB, Serum  4.4 (Result(s) reported on 01 Dec 2014 at 10:30PM.)   Pertinent Past History:  Pertinent Past History As above   Hospital Course:  Hospital Course She presented for cardiac cath on 12/01/2014 that showed pLAD with 30% stenosis, mLAD 70% stenosis s/p PCI, LCx with minor irregs, RCA with chronic occlusion with collaterals. EF unable to be assesed. She ambulated well without any anginal symptoms. She was started on Plavix 75 mg daily. She was started on Lipitor prior to her cath. Her blood pressure was noted to be elevated in the 170s, lisinopril 20 mg was added at discharge with a planned bmet in one week.   Condition on Discharge  Stable   Code Status:  Code Status Full Code   DISCHARGE INSTRUCTIONS HOME MEDS:  Medication Reconciliation: Patient's Home Medications at Discharge:     Medication Instructions  aspirin 81 mg oral tablet  1 tab(s) orally once a day   atorvastatin 40 mg oral tablet  1 tab(s) orally once a day (at bedtime)   cetirizine 10 mg oral capsule  1 cap(s) orally once a day   cholecalciferol 1000 intl units oral tablet  1 tab(s) orally once a day    drisdol 50,000 intl units oral capsule  1 cap(s) orally once a week   gabapentin 300 mg oral capsule   orally 2 times a day (after meals)   gas-x 80 mg oral tablet, chewable   orally once a day   acetaminophen-hydrocodone 325 mg-5 mg oral tablet  1 tab(s) orally every 6 hours, As Needed - for Pain   robaxin 750 mg oral tablet  1 tab(s) orally 4 times a day   metoprolol 25 mg oral tablet, extended release  1 tab(s) orally once a day   nitroglycerin 0.3 mg sublingual tablet  1 tab(s) sublingual every 5 minutes, As Needed - for Chest Pain   clopidogrel 75 mg oral tablet  1 tab(s) orally once a day   pantoprazole 40 mg oral delayed release tablet  1 tab(s) orally once a day    PRESCRIPTIONS: ELECTRONICALLY SUBMITTED   Physician's Instructions:  Dressing Care Remove dressing tomorrow.  May shower.   Diet Low Fat, Low Cholesterol   Activity Limitations No heavy lifting   Return to Work 1 week   Time frame for Follow Up Appointment 1-2 weeks     Fletcher Anon, Muhammad(Attending Physician): [facility], [address] [place], [place], [place] [address], [place]  Electronic Signatures: Rise Mu (PA-C)  [id number] 22-Jan-16 09:32)  Authored: ADMISSION DATE AND DIAGNOSIS, CHIEF COMPLAINT/HPI, Allergies, PERTINENT LABS, PERTINENT PAST HISTORY, HOSPITAL COURSE, DISCHARGE INSTRUCTIONS HOME MEDS, PATIENT INSTRUCTIONS, Follow Up Physician   Last Updated: 22-Jan-16 09:32 by Rise Mu (PA-C)

## 2015-03-14 ENCOUNTER — Encounter: Payer: Self-pay | Admitting: Internal Medicine

## 2015-03-14 DIAGNOSIS — R21 Rash and other nonspecific skin eruption: Secondary | ICD-10-CM

## 2015-03-30 ENCOUNTER — Ambulatory Visit: Payer: Commercial Managed Care - HMO | Admitting: Cardiovascular Disease

## 2015-03-30 DIAGNOSIS — D2261 Melanocytic nevi of right upper limb, including shoulder: Secondary | ICD-10-CM | POA: Diagnosis not present

## 2015-03-30 DIAGNOSIS — R202 Paresthesia of skin: Secondary | ICD-10-CM | POA: Diagnosis not present

## 2015-03-30 DIAGNOSIS — D225 Melanocytic nevi of trunk: Secondary | ICD-10-CM | POA: Diagnosis not present

## 2015-03-30 DIAGNOSIS — L281 Prurigo nodularis: Secondary | ICD-10-CM | POA: Diagnosis not present

## 2015-04-20 ENCOUNTER — Ambulatory Visit: Payer: Commercial Managed Care - HMO | Admitting: Nurse Practitioner

## 2015-04-21 ENCOUNTER — Ambulatory Visit (INDEPENDENT_AMBULATORY_CARE_PROVIDER_SITE_OTHER): Payer: Commercial Managed Care - HMO | Admitting: Nurse Practitioner

## 2015-04-21 VITALS — BP 128/76 | HR 73 | Temp 98.3°F | Resp 16 | Ht 59.0 in | Wt 130.0 lb

## 2015-04-21 DIAGNOSIS — G5621 Lesion of ulnar nerve, right upper limb: Secondary | ICD-10-CM | POA: Diagnosis not present

## 2015-04-21 NOTE — Progress Notes (Signed)
   Subjective:    Patient ID: Suzanne Cole, female    DOB: 02/21/1951, 64 y.o.   MRN: [id number]  HPI  Suzanne Cole is a 64 yo female with a CC of hand pain.   1) Numbness right hand and ring finger and stops halfway into her hand, wears copper gloved  About a year, off and worsening, last few months worsening,   Ache, numbness,  No worse position, glove is helpful  On the computer often    On Gabapentin and Norco  Review of Systems  Constitutional: Negative for fever, chills, diaphoresis and fatigue.  HENT: Negative for tinnitus and trouble swallowing.   Eyes: Negative for visual disturbance.  Respiratory: Negative for cough, chest tightness and wheezing.   Cardiovascular: Negative for chest pain, palpitations and leg swelling.  Gastrointestinal: Negative for nausea, vomiting and diarrhea.  Skin: Negative for rash.  Neurological: Positive for weakness and numbness.  Hematological: Bruises/bleeds easily.       States she is clumsy  Psychiatric/Behavioral: Negative for suicidal ideas and sleep disturbance. The patient is not nervous/anxious.       Objective:   Physical Exam  Constitutional: She is oriented to person, place, and time. She appears well-developed and well-nourished. No distress.  BP 128/76 mmHg  Pulse 73  Temp(Src) 98.3 F (36.8 C)  Resp 16  Ht 4\' 11"  (1.499 m)  Wt 130 lb (58.968 kg)  BMI 26.24 kg/m2  SpO2 97%   Musculoskeletal: She exhibits tenderness. She exhibits no edema.  Neurological: She is alert and oriented to person, place, and time. She displays no atrophy and no tremor. A sensory deficit is present. No cranial nerve deficit. She exhibits abnormal muscle tone. She displays no seizure activity. Coordination and gait normal.  Patient has decreased grip strength on right and decreased sensation on fourth and fifth fingers palmar surface. Positive phalen, negative tinel at median nerve, positive tinel at ulnar on right   Skin: She is not diaphoretic.     Assessment & Plan:

## 2015-04-21 NOTE — Patient Instructions (Addendum)
We will contact you about your referral to Neurology.

## 2015-04-21 NOTE — Progress Notes (Signed)
Pre visit review using our clinic review tool, if applicable. No additional management support is needed unless otherwise documented below in the visit note. 

## 2015-04-28 ENCOUNTER — Encounter: Payer: Self-pay | Admitting: Internal Medicine

## 2015-04-30 ENCOUNTER — Encounter: Payer: Self-pay | Admitting: Nurse Practitioner

## 2015-04-30 DIAGNOSIS — G5620 Lesion of ulnar nerve, unspecified upper limb: Secondary | ICD-10-CM | POA: Insufficient documentation

## 2015-04-30 HISTORY — DX: Lesion of ulnar nerve, unspecified upper limb: G56.20

## 2015-04-30 NOTE — Assessment & Plan Note (Signed)
Decreased grip strength and distribution of symptoms with gabapentin therapy on board warrants referral to neurology for EMG and other possible treatments. Pt on board with this. FU prn worsening/failure to improve.

## 2015-05-11 ENCOUNTER — Telehealth: Payer: Self-pay | Admitting: *Deleted

## 2015-05-11 DIAGNOSIS — I10 Essential (primary) hypertension: Secondary | ICD-10-CM

## 2015-05-11 MED ORDER — HYDROCODONE-ACETAMINOPHEN 5-325 MG PO TABS: 1.0000 | ORAL_TABLET | Freq: Four times a day (QID) | ORAL | Status: DC | PRN

## 2015-05-11 NOTE — Telephone Encounter (Signed)
Pt called requesting Hydrocodone refill.  Last OV 6.10.16.  Last refill 5.29.16

## 2015-05-11 NOTE — Telephone Encounter (Signed)
Appoint scheduled, pt aware Rx ready

## 2015-05-11 NOTE — Telephone Encounter (Signed)
Ok to refill,  printed rx through august  .  Going forward, she will have to have an OV every 3 months to review use of medication. Will need appt in September

## 2015-07-20 DIAGNOSIS — R200 Anesthesia of skin: Secondary | ICD-10-CM | POA: Insufficient documentation

## 2015-07-20 DIAGNOSIS — M6281 Muscle weakness (generalized): Secondary | ICD-10-CM | POA: Diagnosis not present

## 2015-07-20 DIAGNOSIS — R29898 Other symptoms and signs involving the musculoskeletal system: Secondary | ICD-10-CM | POA: Insufficient documentation

## 2015-08-03 ENCOUNTER — Encounter: Payer: Self-pay | Admitting: Internal Medicine

## 2015-08-03 ENCOUNTER — Ambulatory Visit (INDEPENDENT_AMBULATORY_CARE_PROVIDER_SITE_OTHER): Payer: Commercial Managed Care - HMO | Admitting: Internal Medicine

## 2015-08-03 VITALS — BP 148/84 | HR 57 | Temp 98.3°F | Resp 12 | Ht 59.0 in | Wt 126.5 lb

## 2015-08-03 DIAGNOSIS — I6522 Occlusion and stenosis of left carotid artery: Secondary | ICD-10-CM

## 2015-08-03 DIAGNOSIS — M810 Age-related osteoporosis without current pathological fracture: Secondary | ICD-10-CM | POA: Diagnosis not present

## 2015-08-03 DIAGNOSIS — E785 Hyperlipidemia, unspecified: Secondary | ICD-10-CM

## 2015-08-03 DIAGNOSIS — E559 Vitamin D deficiency, unspecified: Secondary | ICD-10-CM

## 2015-08-03 DIAGNOSIS — I10 Essential (primary) hypertension: Secondary | ICD-10-CM | POA: Diagnosis not present

## 2015-08-03 DIAGNOSIS — Z79899 Other long term (current) drug therapy: Secondary | ICD-10-CM | POA: Diagnosis not present

## 2015-08-03 DIAGNOSIS — Z23 Encounter for immunization: Secondary | ICD-10-CM | POA: Diagnosis not present

## 2015-08-03 DIAGNOSIS — M5416 Radiculopathy, lumbar region: Secondary | ICD-10-CM

## 2015-08-03 LAB — CBC WITH DIFFERENTIAL/PLATELET
Basophils Absolute: 0 10*3/uL (ref 0.0–0.1)
Basophils Relative: 0.6 % (ref 0.0–3.0)
EOS PCT: 3.4 % (ref 0.0–5.0)
Eosinophils Absolute: 0.2 10*3/uL (ref 0.0–0.7)
HCT: 38.3 % (ref 36.0–46.0)
Hemoglobin: 12.9 g/dL (ref 12.0–15.0)
LYMPHS ABS: 2 10*3/uL (ref 0.7–4.0)
Lymphocytes Relative: 30.2 % (ref 12.0–46.0)
MCHC: 33.8 g/dL (ref 30.0–36.0)
MCV: 89.1 fl (ref 78.0–100.0)
MONO ABS: 0.6 10*3/uL (ref 0.1–1.0)
Monocytes Relative: 8.2 % (ref 3.0–12.0)
NEUTROS PCT: 57.6 % (ref 43.0–77.0)
Neutro Abs: 3.9 10*3/uL (ref 1.4–7.7)
PLATELETS: 277 10*3/uL (ref 150.0–400.0)
RBC: 4.3 Mil/uL (ref 3.87–5.11)
RDW: 13.1 % (ref 11.5–15.5)
WBC: 6.8 10*3/uL (ref 4.0–10.5)

## 2015-08-03 LAB — LIPID PANEL
Cholesterol: 161 mg/dL (ref 0–200)
HDL: 43 mg/dL (ref >39.00)
NONHDL: 118.29
Total CHOL/HDL Ratio: 4
Triglycerides: 221 mg/dL — ABNORMAL HIGH (ref 0.0–149.0)
VLDL: 44.2 mg/dL — ABNORMAL HIGH (ref 0.0–40.0)

## 2015-08-03 LAB — COMPREHENSIVE METABOLIC PANEL
ALT: 12 U/L (ref 0–35)
AST: 20 U/L (ref 0–37)
Albumin: 4.1 g/dL (ref 3.5–5.2)
Alkaline Phosphatase: 52 U/L (ref 39–117)
BILIRUBIN TOTAL: 0.7 mg/dL (ref 0.2–1.2)
BUN: 20 mg/dL (ref 6–23)
CO2: 27 meq/L (ref 19–32)
CREATININE: 1.06 mg/dL (ref 0.40–1.20)
Calcium: 9.5 mg/dL (ref 8.4–10.5)
Chloride: 105 mEq/L (ref 96–112)
GFR: 55.48 mL/min — AB (ref >60.00)
GLUCOSE: 88 mg/dL (ref 70–99)
Potassium: 5 mEq/L (ref 3.5–5.1)
SODIUM: 139 meq/L (ref 135–145)
Total Protein: 7.4 g/dL (ref 6.0–8.3)

## 2015-08-03 LAB — VITAMIN D 25 HYDROXY (VIT D DEFICIENCY, FRACTURES): VITD: 25.98 ng/mL — AB (ref 30.00–100.00)

## 2015-08-03 LAB — LDL CHOLESTEROL, DIRECT: Direct LDL: 77 mg/dL

## 2015-08-03 LAB — TSH: TSH: 1.29 u[IU]/mL (ref 0.35–4.50)

## 2015-08-03 MED ORDER — HYDROCODONE-ACETAMINOPHEN 5-325 MG PO TABS: 1.0000 | ORAL_TABLET | Freq: Four times a day (QID) | ORAL | Status: DC | PRN

## 2015-08-03 NOTE — Progress Notes (Signed)
Subjective:  Patient ID: Suzanne Cole, female    DOB: December 26, 1950  Age: 64 y.o. MRN: [id number]  CC: The primary encounter diagnosis was Encounter for immunization. Diagnoses of Essential hypertension, Long-term use of high-risk medication, Hyperlipidemia LDL goal <70, Atherosclerosis of left carotid artery, Vitamin D deficiency, Osteoporosis, and Lumbar radiculitis were also pertinent to this visit.  HPI Suzanne Cole presents for follow up on chronic pain managed with stable doses of vicodin and gabapentin, secondry to DDD compicated by scoliosis.    Hyperlipidemia   Hypertension: elevated today.  Has been eating a lot of canned soups due to recent oral surgery.      Outpatient Prescriptions Prior to Visit  Medication Sig Dispense Refill  . alendronate (FOSAMAX) 70 MG tablet Take 1 tablet (70 mg total) by mouth every 7 (seven) days. Take with a full glass of water on an empty stomach. 4 tablet 11  . aspirin 81 MG tablet Take 81 mg by mouth as needed for pain.    [name] atorvastatin (LIPITOR) 40 MG tablet Take 1 tablet (40 mg total) by mouth daily. 30 tablet 6  . cetirizine (ZYRTEC) 10 MG tablet Take 10 mg by mouth daily.    . cholecalciferol (VITAMIN D) 1000 UNITS tablet Take 2,000 Units by mouth daily.     . clopidogrel (PLAVIX) 75 MG tablet Take 1 tablet (75 mg total) by mouth daily. 90 tablet 3  . ergocalciferol (DRISDOL) 50000 UNITS capsule Take 1 capsule (50,000 Units total) by mouth once a week. 12 capsule 0  . gabapentin (NEURONTIN) 300 MG capsule Take 1 capsule (300 mg total) by mouth 2 (two) times daily. 180 capsule 3  . lisinopril (PRINIVIL,ZESTRIL) 20 MG tablet Take 1 tablet (20 mg total) by mouth daily. 30 tablet 11  . meloxicam (MOBIC) 15 MG tablet TAKE 1 TABLET EVERY DAY 90 tablet 6  . methocarbamol (ROBAXIN) 750 MG tablet TAKE 1 TABLET FOUR TIMES DAILY 360 tablet 5  . metoprolol succinate (TOPROL-XL) 25 MG 24 hr tablet Take 1 tablet (25 mg total) by mouth daily. 90 tablet 3   . nitroGLYCERIN (NITROSTAT) 0.3 MG SL tablet Place 1 tablet (0.3 mg total) under the tongue every 5 (five) minutes as needed for chest pain. 30 tablet 0  . pantoprazole (PROTONIX) 40 MG tablet Take 1 tablet (40 mg total) by mouth daily. 30 tablet 11  . ranitidine (ZANTAC) 150 MG tablet Take 1 tablet (150 mg total) by mouth 2 (two) times daily. 60 tablet 3  . HYDROcodone-acetaminophen (NORCO/VICODIN) 5-325 MG per tablet Take 1 tablet by mouth every 6 (six) hours as needed. May refill on or after July 10, 2015 120 tablet 0   No facility-administered medications prior to visit.    Review of Systems;  Patient denies headache, fevers, malaise, unintentional weight loss, skin rash, eye pain, sinus congestion and sinus pain, sore throat, dysphagia,  hemoptysis , cough, dyspnea, wheezing, chest pain, palpitations, orthopnea, edema, abdominal pain, nausea, melena, diarrhea, constipation, flank pain, dysuria, hematuria, urinary  Frequency, nocturia, numbness, tingling, seizures,  Focal weakness, Loss of consciousness,  Tremor, insomnia, depression, anxiety, and suicidal ideation.      Objective:  BP 148/84 mmHg  Pulse 57  Temp(Src) 98.3 F (36.8 C) (Oral)  Resp 12  Ht 4\' 11"  (1.499 m)  Wt 126 lb 8 oz (57.38 kg)  BMI 25.54 kg/m2  SpO2 98%  BP Readings from Last 3 Encounters:  08/03/15 148/84  04/21/15 128/76  12/30/14 120/60  Wt Readings from Last 3 Encounters:  08/03/15 126 lb 8 oz (57.38 kg)  04/21/15 130 lb (58.968 kg)  12/30/14 132 lb 8 oz (60.102 kg)    General appearance: alert, cooperative and appears stated age Ears: normal TM's and external ear canals both ears Throat: lips, mucosa, and tongue normal; teeth and gums normal Neck: no adenopathy, no carotid bruit, supple, symmetrical, trachea midline and thyroid not enlarged, symmetric, no tenderness/mass/nodules Back: symmetric, no curvature. ROM normal. No CVA tenderness. Lungs: clear to auscultation bilaterally Heart:  regular rate and rhythm, S1, S2 normal, no murmur, click, rub or gallop Abdomen: soft, non-tender; bowel sounds normal; no masses,  no organomegaly Pulses: 2+ and symmetric Skin: Skin color, texture, turgor normal. No rashes or lesions Lymph nodes: Cervical, supraclavicular, and axillary nodes normal.  Lab Results  Component Value Date   HGBA1C 5.7 11/09/2014    Lab Results  Component Value Date   CREATININE 1.06 08/03/2015   CREATININE 1.03 12/02/2014   CREATININE 1.0 12/02/2014    Lab Results  Component Value Date   WBC 6.8 08/03/2015   HGB 12.9 08/03/2015   HCT 38.3 08/03/2015   PLT 277.0 08/03/2015   GLUCOSE 88 08/03/2015   CHOL 161 08/03/2015   TRIG 221.0* 08/03/2015   HDL 43.00 08/03/2015   LDLDIRECT 77.0 08/03/2015   LDLCALC 111* 05/26/2014   ALT 12 08/03/2015   AST 20 08/03/2015   NA 139 08/03/2015   K 5.0 08/03/2015   CL 105 08/03/2015   CREATININE 1.06 08/03/2015   BUN 20 08/03/2015   CO2 27 08/03/2015   TSH 1.29 08/03/2015   INR 0.9 11/28/2014   HGBA1C 5.7 11/09/2014    No results found.  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   Prior referral to Dr. Loistine Chance in September 2014 was done for evaluation of alternative modalities for pain control. Gabapentin, Lyrica, and Cymbalta trials were recommended.  PT was offered but declined due to out of pocket expense of $45/session.  MRI and epidural injections were also offered but deferred by patient.   I will continue to refill her Norco at the current dose and quantity but will strongly recommend neurosurgical  Consult if her pain increases.        Hypertension    Elevated secondary to increased salt intake due to use of canned soups post oral surgery , will have  patient recheck in a week or so .  Lab Results  Component Value Date   NA 139 08/03/2015   K 5.0 08/03/2015   CL 105 08/03/2015   CO2 27 08/03/2015  ' Lab Results  Component Value Date   CREATININE 1.06 08/03/2015         Hyperlipidemia LDL goal <70    High potency statin was started during  Diagnosis of CAD.  LDL and triglycerides are at goal on current medications. He has no side effects and liver enzymes are normal. No changes today.  Lab Results  Component Value Date   CHOL 161 08/03/2015   HDL 43.00 08/03/2015   LDLCALC 111* 05/26/2014   LDLDIRECT 77.0 08/03/2015   TRIG 221.0* 08/03/2015   CHOLHDL 4 08/03/2015   Lab Results  Component Value Date   ALT  12 08/03/2015   AST 20 08/03/2015   ALKPHOS 52 08/03/2015   BILITOT 0.7 08/03/2015           Atherosclerosis of left carotid artery   Osteoporosis    Other Visit Diagnoses    Encounter for immunization    -  Primary    Long-term use of high-risk medication        Vitamin D deficiency           I have discontinued Ms. Ally HYDROcodone-acetaminophen and HYDROcodone-acetaminophen. I have also changed her HYDROcodone-acetaminophen. Additionally, I am having her maintain her cholecalciferol, cetirizine, methocarbamol, meloxicam, nitroGLYCERIN, metoprolol succinate, ergocalciferol, aspirin, atorvastatin, clopidogrel, pantoprazole, lisinopril, gabapentin, alendronate, ranitidine, and clindamycin.  Meds ordered this encounter  Medications  . clindamycin (CLEOCIN) 150 MG capsule    Sig: Take 1 capsule by mouth 4 (four) times daily.  [name] DISCONTD: HYDROcodone-acetaminophen (NORCO/VICODIN) 5-325 MG per tablet    Sig: Take 1 tablet by mouth every 6 (six) hours as needed. May refill on or after Sept 29, 2016    Dispense:  120 tablet    Refill:  0  . DISCONTD: HYDROcodone-acetaminophen (NORCO/VICODIN) 5-325 MG per tablet    Sig: Take 1 tablet by mouth every 6 (six) hours as needed. May refill on or after September 09, 2015    Dispense:  120 tablet    Refill:  0  . HYDROcodone-acetaminophen (NORCO/VICODIN) 5-325 MG per tablet    Sig: Take 1 tablet by mouth every 6 (six) hours as needed. May refill on or after October 10, 2015    Dispense:  120 tablet    Refill:  0    Medications Discontinued During This Encounter  Medication Reason  . HYDROcodone-acetaminophen (NORCO/VICODIN) 5-325 MG per tablet Reorder  . HYDROcodone-acetaminophen (NORCO/VICODIN) 5-325 MG per tablet Reorder  . HYDROcodone-acetaminophen (NORCO/VICODIN) 5-325 MG per tablet Reorder    Follow-up: No Follow-up on file.   [name], MD

## 2015-08-03 NOTE — Progress Notes (Signed)
Pre-visit discussion using our clinic review tool. No additional management support is needed unless otherwise documented below in the visit note.  

## 2015-08-03 NOTE — Patient Instructions (Addendum)
You may want to add protein powder to your fruit smoothies.  Here are a few low carb protein powders  You can try:   Vega : all protein  [employer]:  ([employer])  Chemical engineer

## 2015-08-06 ENCOUNTER — Encounter: Payer: Self-pay | Admitting: Internal Medicine

## 2015-08-06 NOTE — Assessment & Plan Note (Signed)
Elevated secondary to increased salt intake due to use of canned soups post oral surgery , will have patient recheck in a week or so .  Lab Results  Component Value Date   NA 139 08/03/2015   K 5.0 08/03/2015   CL 105 08/03/2015   CO2 27 08/03/2015  ' Lab Results  Component Value Date   CREATININE 1.06 08/03/2015

## 2015-08-06 NOTE — Assessment & Plan Note (Signed)
L3, severe , Secondary to degenerative disk disease , aggravated by severe scoliosis and loss of bulk muscle during prior nephrectomy.[name]  Has been narcotic dependent since age 64.   Prior referral to Dr. Loistine Chance in September 2014 was done for evaluation of alternative modalities for pain control. Gabapentin, Lyrica, and Cymbalta trials were recommended.  PT was offered but declined due to out of pocket expense of $45/session.  MRI and epidural injections were also offered but deferred by patient.   I will continue to refill her Norco at the current dose and quantity but will strongly recommend neurosurgical  Consult if her pain increases.

## 2015-08-06 NOTE — Assessment & Plan Note (Signed)
High potency statin was started during  Diagnosis of CAD.  LDL and triglycerides are at goal on current medications. He has no side effects and liver enzymes are normal. No changes today.  Lab Results  Component Value Date   CHOL 161 08/03/2015   HDL 43.00 08/03/2015   LDLCALC 111* 05/26/2014   LDLDIRECT 77.0 08/03/2015   TRIG 221.0* 08/03/2015   CHOLHDL 4 08/03/2015   Lab Results  Component Value Date   ALT 12 08/03/2015   AST 20 08/03/2015   ALKPHOS 52 08/03/2015   BILITOT 0.7 08/03/2015

## 2015-08-16 DIAGNOSIS — G5603 Carpal tunnel syndrome, bilateral upper limbs: Secondary | ICD-10-CM | POA: Diagnosis not present

## 2015-08-16 DIAGNOSIS — M6281 Muscle weakness (generalized): Secondary | ICD-10-CM | POA: Diagnosis not present

## 2015-08-16 DIAGNOSIS — R200 Anesthesia of skin: Secondary | ICD-10-CM | POA: Diagnosis not present

## 2015-09-06 ENCOUNTER — Telehealth: Payer: Self-pay | Admitting: *Deleted

## 2015-09-06 DIAGNOSIS — G5603 Carpal tunnel syndrome, bilateral upper limbs: Secondary | ICD-10-CM

## 2015-09-06 NOTE — Telephone Encounter (Signed)
If she is requesting a referral for carpal tunnel surgery,  I would highly recommend having it done by a hand surgeon or a neurosrugeon,  And there are none in [place].  But several in [place].  I will start the referral to Dr Abelardo Diesel the hand surgeon

## 2015-09-06 NOTE — Telephone Encounter (Signed)
Left message for patient to call office.  

## 2015-09-06 NOTE — Telephone Encounter (Signed)
Dr. Melrose Nakayama stated to patient that he has severe carpal tunnel in right hand and moderate in the left moderate carpal tunnel but in order for insurance to pay patient has to be referred by primary,patient would like to see someone in [place] if possible.

## 2015-09-06 NOTE — Telephone Encounter (Signed)
Patient stated that she was referred back to Dr. Derrel Nip office;by Dr. Noel Gerold get a referral to the orthopedic surgeon.

## 2015-09-07 ENCOUNTER — Encounter: Payer: Self-pay | Admitting: *Deleted

## 2015-09-07 NOTE — Telephone Encounter (Signed)
Patient notified and agreed with referral.

## 2015-09-13 ENCOUNTER — Other Ambulatory Visit: Payer: Self-pay | Admitting: Internal Medicine

## 2015-09-16 ENCOUNTER — Other Ambulatory Visit: Payer: Self-pay | Admitting: Internal Medicine

## 2015-09-19 DIAGNOSIS — G5602 Carpal tunnel syndrome, left upper limb: Secondary | ICD-10-CM | POA: Diagnosis not present

## 2015-09-19 DIAGNOSIS — G5601 Carpal tunnel syndrome, right upper limb: Secondary | ICD-10-CM | POA: Diagnosis not present

## 2015-10-13 ENCOUNTER — Telehealth: Payer: Self-pay

## 2015-10-13 NOTE — Telephone Encounter (Signed)
Received request for cardiac clearance from [facility]. Notified Dorien Chihuahua at ortho that pt last seen 3/16 w/recommendations Cardiac Rehab and 3 month f/u. Pt has not been seen since March OV. S/w pt regarding need to f/u appt before clearance can be signed. Pt states she never went to cardiac rehab. Forward to scheduling for appt.

## 2015-10-16 ENCOUNTER — Other Ambulatory Visit: Payer: Self-pay | Admitting: Cardiovascular Disease

## 2015-10-21 DIAGNOSIS — J209 Acute bronchitis, unspecified: Secondary | ICD-10-CM | POA: Diagnosis not present

## 2015-10-27 ENCOUNTER — Encounter: Payer: Self-pay | Admitting: Cardiovascular Disease

## 2015-10-27 ENCOUNTER — Ambulatory Visit (INDEPENDENT_AMBULATORY_CARE_PROVIDER_SITE_OTHER): Payer: Commercial Managed Care - HMO | Admitting: Cardiovascular Disease

## 2015-10-27 ENCOUNTER — Telehealth: Payer: Self-pay

## 2015-10-27 VITALS — BP 154/80 | HR 69 | Ht 59.0 in | Wt 122.0 lb

## 2015-10-27 DIAGNOSIS — Z01810 Encounter for preprocedural cardiovascular examination: Secondary | ICD-10-CM

## 2015-10-27 DIAGNOSIS — Z13820 Encounter for screening for osteoporosis: Secondary | ICD-10-CM | POA: Insufficient documentation

## 2015-10-27 DIAGNOSIS — E785 Hyperlipidemia, unspecified: Secondary | ICD-10-CM

## 2015-10-27 DIAGNOSIS — I10 Essential (primary) hypertension: Secondary | ICD-10-CM | POA: Diagnosis not present

## 2015-10-27 DIAGNOSIS — I2510 Atherosclerotic heart disease of native coronary artery without angina pectoris: Secondary | ICD-10-CM | POA: Diagnosis not present

## 2015-10-27 DIAGNOSIS — Z01818 Encounter for other preprocedural examination: Secondary | ICD-10-CM

## 2015-10-27 DIAGNOSIS — I6522 Occlusion and stenosis of left carotid artery: Secondary | ICD-10-CM

## 2015-10-27 MED ORDER — LISINOPRIL 40 MG PO TABS: 40.0000 mg | ORAL_TABLET | Freq: Every day | ORAL | Status: DC

## 2015-10-27 NOTE — Telephone Encounter (Signed)
Cardiac clearance completed and faxed to [facility] at [phone]

## 2015-10-27 NOTE — Assessment & Plan Note (Signed)
She has no symptoms of angina. Continue medical therapy. 

## 2015-10-27 NOTE — Assessment & Plan Note (Signed)
The patient had drug-eluting stent placement to the LAD in January 2016. No cardiac symptoms since then. EKG is normal. Thus, she is at low risk for cardiovascular complications from a cardiac standpoint. Should continue low-dose aspirin and Plavix. Plavix can be held for 5 to 7 days before surgery if needed as long as it is done after the new year.
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
## 2015-10-27 NOTE — Assessment & Plan Note (Signed)
Blood pressure is elevated. I increased the dose of lisinopril to 40 mg once daily.

## 2015-10-27 NOTE — Assessment & Plan Note (Signed)
Carotid Doppler showed 40-59% of the left carotid artery. She will require carotid Doppler in the next 6 months.

## 2015-10-27 NOTE — Assessment & Plan Note (Signed)
Lab Results  Component Value Date   CHOL 161 08/03/2015   HDL 43.00 08/03/2015        LDLDIRECT 77.0 08/03/2015   TRIG 221.0* 08/03/2015   CHOLHDL 4 08/03/2015   LDL was close to target. Continue treatment with atorvastatin.

## 2015-10-27 NOTE — Patient Instructions (Signed)
Medication Instructions:  Your physician has recommended you make the following change in your medication:  INCREASE lisinopril 40mg  daily   Labwork: none  Testing/Procedures: none  Follow-Up: Your physician wants you to follow-up in: six months with Dr. Fletcher Anon.  You will receive a reminder letter in the mail two months in advance. If you don't receive a letter, please call our office to schedule the follow-up appointment.   Any Other Special Instructions Will Be Listed Below (If Applicable).     If you need a refill on your cardiac medications before your next appointment, please call your pharmacy.

## 2015-11-09 ENCOUNTER — Other Ambulatory Visit: Payer: Self-pay | Admitting: Internal Medicine

## 2015-11-09 MED ORDER — HYDROCODONE-ACETAMINOPHEN 5-325 MG PO TABS: 1.0000 | ORAL_TABLET | Freq: Four times a day (QID) | ORAL | Status: DC | PRN

## 2015-11-09 NOTE — Telephone Encounter (Signed)
90 day supply authorized and rx printed.  Needs appt in March

## 2015-11-09 NOTE — Telephone Encounter (Signed)
Patients last OV was 08/03/15, medication was last refilled on 08/03/15 #120, no refills.  Please advise?

## 2015-11-09 NOTE — Telephone Encounter (Signed)
Pt called to get a refill for medication HYDROcodone-acetaminophen (NORCO/VICODIN) 5-325 MG per tablet . Please call pt when it's ready. Thank You!

## 2015-11-10 ENCOUNTER — Telehealth: Payer: Self-pay | Admitting: Internal Medicine

## 2015-11-10 DIAGNOSIS — Z1239 Encounter for other screening for malignant neoplasm of breast: Secondary | ICD-10-CM

## 2015-11-10 NOTE — Telephone Encounter (Signed)
Pt came in stating that she wanted to have a mammogram done on 1.20.17.[name] Please advise pt

## 2015-11-10 NOTE — Telephone Encounter (Signed)
Referral is in process as requested 

## 2015-11-15 ENCOUNTER — Other Ambulatory Visit: Payer: Self-pay | Admitting: Physician Assistant

## 2015-12-12 ENCOUNTER — Other Ambulatory Visit: Payer: Self-pay | Admitting: Internal Medicine

## 2015-12-15 ENCOUNTER — Telehealth: Payer: Self-pay | Admitting: *Deleted

## 2015-12-15 ENCOUNTER — Other Ambulatory Visit: Payer: Self-pay | Admitting: Internal Medicine

## 2015-12-15 NOTE — Telephone Encounter (Signed)
Lmom to call our office for a carotid u/s (1 yr f/u).

## 2015-12-26 ENCOUNTER — Encounter: Payer: Self-pay | Admitting: *Deleted

## 2016-01-09 ENCOUNTER — Encounter: Payer: Self-pay | Admitting: Internal Medicine

## 2016-01-12 ENCOUNTER — Other Ambulatory Visit: Payer: Self-pay | Admitting: Internal Medicine

## 2016-01-15 DIAGNOSIS — Z1231 Encounter for screening mammogram for malignant neoplasm of breast: Secondary | ICD-10-CM | POA: Diagnosis not present

## 2016-01-15 LAB — HM MAMMOGRAPHY: HM Mammogram: NEGATIVE

## 2016-01-16 ENCOUNTER — Encounter: Payer: Self-pay | Admitting: Internal Medicine

## 2016-01-17 ENCOUNTER — Telehealth: Payer: Self-pay | Admitting: Internal Medicine

## 2016-01-17 NOTE — Telephone Encounter (Signed)
MyChart message sent  Re mammogram normal

## 2016-02-04 ENCOUNTER — Other Ambulatory Visit: Payer: Self-pay | Admitting: Cardiovascular Disease

## 2016-02-08 ENCOUNTER — Ambulatory Visit (INDEPENDENT_AMBULATORY_CARE_PROVIDER_SITE_OTHER): Payer: PPO | Admitting: Internal Medicine

## 2016-02-08 ENCOUNTER — Encounter: Payer: Self-pay | Admitting: Internal Medicine

## 2016-02-08 ENCOUNTER — Telehealth: Payer: Self-pay | Admitting: *Deleted

## 2016-02-08 VITALS — BP 128/70 | HR 66 | Temp 98.1°F | Resp 12 | Ht 59.0 in | Wt 125.5 lb

## 2016-02-08 DIAGNOSIS — J301 Allergic rhinitis due to pollen: Secondary | ICD-10-CM

## 2016-02-08 DIAGNOSIS — Z79891 Long term (current) use of opiate analgesic: Secondary | ICD-10-CM | POA: Diagnosis not present

## 2016-02-08 DIAGNOSIS — E785 Hyperlipidemia, unspecified: Secondary | ICD-10-CM | POA: Diagnosis not present

## 2016-02-08 DIAGNOSIS — E875 Hyperkalemia: Secondary | ICD-10-CM | POA: Diagnosis not present

## 2016-02-08 DIAGNOSIS — E559 Vitamin D deficiency, unspecified: Secondary | ICD-10-CM

## 2016-02-08 DIAGNOSIS — J449 Chronic obstructive pulmonary disease, unspecified: Secondary | ICD-10-CM | POA: Diagnosis not present

## 2016-02-08 DIAGNOSIS — Z7289 Other problems related to lifestyle: Secondary | ICD-10-CM | POA: Diagnosis not present

## 2016-02-08 DIAGNOSIS — M5416 Radiculopathy, lumbar region: Secondary | ICD-10-CM

## 2016-02-08 DIAGNOSIS — I10 Essential (primary) hypertension: Secondary | ICD-10-CM

## 2016-02-08 LAB — LIPID PANEL
CHOLESTEROL: 238 mg/dL — AB (ref 0–200)
HDL: 43.3 mg/dL (ref >39.00)
Total CHOL/HDL Ratio: 5

## 2016-02-08 LAB — LDL CHOLESTEROL, DIRECT: Direct LDL: 102 mg/dL

## 2016-02-08 LAB — COMPREHENSIVE METABOLIC PANEL
ALK PHOS: 43 U/L (ref 39–117)
ALT: 13 U/L (ref 0–35)
AST: 19 U/L (ref 0–37)
Albumin: 4.7 g/dL (ref 3.5–5.2)
BILIRUBIN TOTAL: 0.8 mg/dL (ref 0.2–1.2)
BUN: 30 mg/dL — ABNORMAL HIGH (ref 6–23)
CALCIUM: 9.8 mg/dL (ref 8.4–10.5)
CO2: 25 mEq/L (ref 19–32)
Chloride: 104 mEq/L (ref 96–112)
Creatinine, Ser: 1.45 mg/dL — ABNORMAL HIGH (ref 0.40–1.20)
GFR: 38.58 mL/min — AB (ref >60.00)
Glucose, Bld: 90 mg/dL (ref 70–99)
Potassium: 6.2 mEq/L (ref 3.5–5.1)
Sodium: 135 mEq/L (ref 135–145)
TOTAL PROTEIN: 8 g/dL (ref 6.0–8.3)

## 2016-02-08 LAB — VITAMIN D 25 HYDROXY (VIT D DEFICIENCY, FRACTURES): VITD: 31.8 ng/mL (ref 30.00–100.00)

## 2016-02-08 MED ORDER — CYCLOBENZAPRINE HCL 10 MG PO TABS: 10.0000 mg | ORAL_TABLET | Freq: Three times a day (TID) | ORAL | Status: DC | PRN

## 2016-02-08 MED ORDER — HYDROCODONE-ACETAMINOPHEN 5-325 MG PO TABS: 1.0000 | ORAL_TABLET | Freq: Four times a day (QID) | ORAL | Status: DC | PRN

## 2016-02-08 MED ORDER — ALBUTEROL SULFATE (2.5 MG/3ML) 0.083% IN NEBU: 2.5000 mg | INHALATION_SOLUTION | Freq: Four times a day (QID) | RESPIRATORY_TRACT | Status: DC | PRN

## 2016-02-08 MED ORDER — MONTELUKAST SODIUM 10 MG PO TABS: 10.0000 mg | ORAL_TABLET | Freq: Every day | ORAL | Status: DC

## 2016-02-08 NOTE — Telephone Encounter (Signed)
Critical  Potassium 6.2

## 2016-02-08 NOTE — Telephone Encounter (Signed)
Called the patient and informed her of her elevated potassium and elevated creatinine. Patient notes she is not having any palpitations, chest pain, shortness of breath, or muscle twitching. She feels well overall and has no complaints. Discussed that this could be related to her lisinopril and that I would like her to hold that and watch her blood pressure. Discussed that we could recheck this tonight though she stated she would not be able to have it rechecked tonight and would be able to come in tomorrow to have this rechecked. We will have nursing call to schedule a lab appointment for tomorrow morning and then call the patient back.

## 2016-02-08 NOTE — Progress Notes (Signed)
Subjective:  Patient ID: Suzanne Cole, female    DOB: 1950/12/06  Age: 65 y.o. MRN: [id number]  CC: The primary encounter diagnosis was Other problems related to lifestyle. Diagnoses of Hyperlipidemia, Vitamin D deficiency, Lumbar radiculitis, Bronchitis, chronic obstructive ([place]), Allergic rhinitis due to pollen, Essential hypertension, and Hyperkalemia were also pertinent to this visit.  HPI Suzanne Cole presents for follow up on chronic issues including chronic back pain managed with narcotic, and chronic bronchitiss.  Chronic bronchitis:  sympomts ae aggravated by seasonal rhinitis and congestion. No regular use of inhalers  Prn use of albterol MDI given to her by Urgent Care visit on Dec 10 th .  Lots of seasonal allergies.  Symptoms have been worse at night. Would like to use a home nebulizer.     Outpatient Prescriptions Prior to Visit  Medication Sig Dispense Refill  . alendronate (FOSAMAX) 70 MG tablet TAKE ONE TABLET BY MOUTH ONCE A WEEK WITH WATER. TAKE ON AN EMPTY STOMACH 4 tablet 0  . aspirin 81 MG tablet Take 81 mg by mouth as needed for pain.    [name] atorvastatin (LIPITOR) 40 MG tablet TAKE ONE TABLET BY MOUTH ONCE DAILY 30 tablet 3  . benzonatate (TESSALON) 200 MG capsule take 1 capsule by mouth three times a day if needed  0  . cetirizine (ZYRTEC) 10 MG tablet Take 10 mg by mouth daily.    . clopidogrel (PLAVIX) 75 MG tablet Take 1 tablet (75 mg total) by mouth daily. 90 tablet 3  . gabapentin (NEURONTIN) 300 MG capsule TAKE ONE CAPSULE BY MOUTH TWICE DAILY 180 capsule 0  . meloxicam (MOBIC) 15 MG tablet TAKE 1 TABLET EVERY DAY 90 tablet 6  . methocarbamol (ROBAXIN) 750 MG tablet TAKE 1 TABLET BY MOUTH FOUR TIMES DAILY 360 tablet 0  . metoprolol succinate (TOPROL-XL) 25 MG 24 hr tablet Take 1 tablet (25 mg total) by mouth daily. 90 tablet 3  . nitroGLYCERIN (NITROSTAT) 0.3 MG SL tablet Place 1 tablet (0.3 mg total) under the tongue every 5 (five) minutes as needed for  chest pain. 30 tablet 0  . pantoprazole (PROTONIX) 40 MG tablet TAKE ONE TABLET BY MOUTH ONCE DAILY 30 tablet 6  . Phenylephrine-Acetaminophen 5-325 MG TABS as needed.     . ranitidine (ZANTAC) 150 MG tablet TAKE 1 TABLET(150 MG) BY MOUTH TWICE DAILY 60 tablet 3  . VENTOLIN HFA 108 (90 BASE) MCG/ACT inhaler inhale 2 puffs by mouth every 4 to 6 hours if needed  0  . HYDROcodone-acetaminophen (NORCO/VICODIN) 5-325 MG tablet Take 1 tablet by mouth every 6 (six) hours as needed. May refill on or after December 10, 2015 120 tablet 0  . HYDROcodone-acetaminophen (NORCO/VICODIN) 5-325 MG tablet Take 1 tablet by mouth every 6 (six) hours as needed. May refill on or after February 28 , 2017 120 tablet 0  . lisinopril (PRINIVIL,ZESTRIL) 40 MG tablet Take 1 tablet (40 mg total) by mouth daily. 30 tablet 6  . doxycycline (VIBRAMYCIN) 100 MG capsule Take 100 mg by mouth 2 (two) times daily.  0   No facility-administered medications prior to visit.    Review of Systems;  Patient denies headache, fevers, malaise, unintentional weight loss, skin rash, eye pain, sinus congestion and sinus pain, sore throat, dysphagia,  hemoptysis , cough, dyspnea, wheezing, chest pain, palpitations, orthopnea, edema, abdominal pain, nausea, melena, diarrhea, constipation, flank pain, dysuria, hematuria, urinary  Frequency, nocturia, numbness, tingling, seizures,  Focal weakness, Loss of consciousness,  Tremor,  insomnia, depression, anxiety, and suicidal ideation.      Objective:  BP 128/70 mmHg  Pulse 66  Temp(Src) 98.1 F (36.7 C) (Oral)  Resp 12  Ht 4\' 11"  (1.499 m)  Wt 125 lb 8 oz (56.926 kg)  BMI 25.33 kg/m2  SpO2 98%  BP Readings from Last 3 Encounters:  02/08/16 128/70  10/27/15 154/80  08/03/15 148/84    Wt Readings from Last 3 Encounters:  02/08/16 125 lb 8 oz (56.926 kg)  10/27/15 122 lb (55.339 kg)  08/03/15 126 lb 8 oz (57.38 kg)    General appearance: alert, cooperative and appears stated  age Ears: normal TM's and external ear canals both ears Throat: lips, mucosa, and tongue normal; teeth and gums normal Neck: no adenopathy, no carotid bruit, supple, symmetrical, trachea midline and thyroid not enlarged, symmetric, no tenderness/mass/nodules Back: symmetric, no curvature. ROM normal. No CVA tenderness. Lungs: clear to auscultation bilaterally Heart: regular rate and rhythm, S1, S2 normal, no murmur, click, rub or gallop Abdomen: soft, non-tender; bowel sounds normal; no masses,  no organomegaly Pulses: 2+ and symmetric Skin: Skin color, texture, turgor normal. No rashes or lesions Lymph nodes: Cervical, supraclavicular, and axillary nodes normal.  Lab Results  Component Value Date   HGBA1C 5.7 11/09/2014    Lab Results  Component Value Date   CREATININE 1.40* 02/09/2016   CREATININE 1.45* 02/08/2016   CREATININE 1.06 08/03/2015    Lab Results  Component Value Date   WBC 6.8 08/03/2015   HGB 12.9 08/03/2015   HCT 38.3 08/03/2015   PLT 277.0 08/03/2015   GLUCOSE 97 02/09/2016   CHOL 238* 02/08/2016   TRIG * 02/08/2016    418.0 Triglyceride is over 400; calculations on Lipids are invalid.   HDL 43.30 02/08/2016   LDLDIRECT 102.0 02/08/2016   LDLCALC 111* 05/26/2014   ALT 13 02/08/2016   AST 19 02/08/2016   NA 137 02/09/2016   K 5.7* 02/09/2016   CL 104 02/09/2016   CREATININE 1.40* 02/09/2016   BUN 32* 02/09/2016   CO2 25 02/09/2016   TSH 1.29 08/03/2015   INR 0.9 11/28/2014   HGBA1C 5.7 11/09/2014    No results found.  Assessment & Plan:   Problem List Items Addressed This Visit    Lumbar radiculitis    L3, severe , Secondary to degenerative disk disease , aggravated by severe scoliosis and loss of bulk muscle during prior nephrectomy.[name]  Has been narcotic dependent since age 1.   Prior referral to Dr. Loistine Chance in September 2014 was done for evaluation of alternative modalities for pain control. Gabapentin, Lyrica, and Cymbalta trials were  recommended.  PT was offered but declined due to out of pocket expense of $45/session.  MRI and epidural injections were also offered but deferred by patient.   I will continue to refill her Norco at the current dose and quantity .           Relevant Medications   cyclobenzaprine (FLEXERIL) 10 MG tablet   Hypertension    Patient's ACE Inhibitor dose was increased several months ago by [place].  Follow up labs today noted critical hyperkalemia.        Bronchitis, chronic obstructive (HCC)    Trial of albuterol home nebs.. Samples given today in office and demonstrated use.       Relevant Medications   montelukast (SINGULAIR) 10 MG tablet   albuterol (PROVENTIL) (2.5 MG/3ML) 0.083% nebulizer solution   Allergic rhinitis    Adding Singulair to zyrtec  Hyperkalemia    Accompanied by increased GFR,  presumed secondary to lisinopril dose increase.  Medication stopped.  Patient notified,   Repeat potassium improved but not back to baseline.  kayexelate daily  for 2 days..  Renal artery ultrasound ordered.       Hyperlipidemia   Relevant Orders   LDL cholesterol, direct (Completed)   Comprehensive metabolic panel (Completed)   Lipid panel (Completed)    Other Visit Diagnoses    Other problems related to lifestyle    -  Primary    Relevant Orders    Hepatitis C antibody (Completed)    HIV antibody (Completed)    HCV RNA quant (Completed)    Vitamin D deficiency        Relevant Orders    VITAMIN D 25 Hydroxy (Vit-D Deficiency, Fractures) (Completed)       I have discontinued Ms. Dorff doxycycline and HYDROcodone-acetaminophen. I have also changed her HYDROcodone-acetaminophen and HYDROcodone-acetaminophen. Additionally, I am having her start on cyclobenzaprine, montelukast, and albuterol. Lastly, I am having her maintain her cetirizine, nitroGLYCERIN, metoprolol succinate, aspirin, clopidogrel, meloxicam, ranitidine, VENTOLIN HFA, benzonatate, Phenylephrine-Acetaminophen,  pantoprazole, gabapentin, methocarbamol, alendronate, and atorvastatin.  Meds ordered this encounter  Medications  . cyclobenzaprine (FLEXERIL) 10 MG tablet    Sig: Take 1 tablet (10 mg total) by mouth 3 (three) times daily as needed for muscle spasms.    Dispense:  90 tablet    Refill:  3  . montelukast (SINGULAIR) 10 MG tablet    Sig: Take 1 tablet (10 mg total) by mouth at bedtime. For seasonal allergies    Dispense:  30 tablet    Refill:  5  . DISCONTD: HYDROcodone-acetaminophen (NORCO/VICODIN) 5-325 MG tablet    Sig: Take 1 tablet by mouth every 6 (six) hours as needed. May refill on or after March 08, 2016    Dispense:  120 tablet    Refill:  0  . HYDROcodone-acetaminophen (NORCO/VICODIN) 5-325 MG tablet    Sig: Take 1 tablet by mouth every 6 (six) hours as needed. May refill on or after March 29 , 2017    Dispense:  120 tablet    Refill:  0  . HYDROcodone-acetaminophen (NORCO/VICODIN) 5-325 MG tablet    Sig: Take 1 tablet by mouth every 6 (six) hours as needed. May refill on or after Apr 07, 2016    Dispense:  120 tablet    Refill:  0  . albuterol (PROVENTIL) (2.5 MG/3ML) 0.083% nebulizer solution    Sig: Take 3 mLs (2.5 mg total) by nebulization every 6 (six) hours as needed for wheezing or shortness of breath.    Dispense:  150 mL    Refill:  1  A total of 40 minutes of face to face time was spent with patient more than half of which was spent in counselling and coordination of care    Medications Discontinued During This Encounter  Medication Reason  . doxycycline (VIBRAMYCIN) 100 MG capsule Completed Course  . HYDROcodone-acetaminophen (NORCO/VICODIN) 5-325 MG tablet Reorder  . HYDROcodone-acetaminophen (NORCO/VICODIN) 5-325 MG tablet Reorder  . HYDROcodone-acetaminophen (NORCO/VICODIN) 5-325 MG tablet Reorder    Follow-up: Return in about 3 months (around 05/10/2016) for annual exam.   Marvina Danner, Aris Everts, MD

## 2016-02-08 NOTE — Patient Instructions (Addendum)
We will order the nebulizer for home use and the medications for it will be sent to your pharmacy   Continue daily Zyrtec,  And start taking generic Singulair for additional allergy management

## 2016-02-08 NOTE — Progress Notes (Signed)
Pre-visit discussion using our clinic review tool. No additional management support is needed unless otherwise documented below in the visit note.  

## 2016-02-08 NOTE — Telephone Encounter (Signed)
Spoke with patient and made lab appointment for tomorrow. Per Dr. Caryl Bis ask about how many times she has urinated today. Patient stated that she has urinated 3 or 4 times today.

## 2016-02-09 ENCOUNTER — Other Ambulatory Visit (INDEPENDENT_AMBULATORY_CARE_PROVIDER_SITE_OTHER): Payer: PPO

## 2016-02-09 ENCOUNTER — Telehealth: Payer: Self-pay | Admitting: Internal Medicine

## 2016-02-09 ENCOUNTER — Other Ambulatory Visit: Payer: Self-pay | Admitting: Internal Medicine

## 2016-02-09 DIAGNOSIS — I10 Essential (primary) hypertension: Secondary | ICD-10-CM

## 2016-02-09 DIAGNOSIS — N178 Other acute kidney failure: Secondary | ICD-10-CM

## 2016-02-09 DIAGNOSIS — E875 Hyperkalemia: Secondary | ICD-10-CM

## 2016-02-09 DIAGNOSIS — N179 Acute kidney failure, unspecified: Secondary | ICD-10-CM

## 2016-02-09 LAB — BASIC METABOLIC PANEL
BUN: 32 mg/dL — ABNORMAL HIGH (ref 6–23)
CO2: 25 mEq/L (ref 19–32)
Calcium: 10.3 mg/dL (ref 8.4–10.5)
Chloride: 104 mEq/L (ref 96–112)
Creatinine, Ser: 1.4 mg/dL — ABNORMAL HIGH (ref 0.40–1.20)
GFR: 40.18 mL/min — AB (ref >60.00)
GLUCOSE: 97 mg/dL (ref 70–99)
Potassium: 5.7 mEq/L — ABNORMAL HIGH (ref 3.5–5.1)
Sodium: 137 mEq/L (ref 135–145)

## 2016-02-09 LAB — HEPATITIS C ANTIBODY: HCV Ab: NEGATIVE

## 2016-02-09 LAB — HIV ANTIBODY (ROUTINE TESTING W REFLEX): HIV: NONREACTIVE

## 2016-02-09 MED ORDER — AMLODIPINE BESYLATE 5 MG PO TABS: 5.0000 mg | ORAL_TABLET | Freq: Every day | ORAL | Status: DC

## 2016-02-09 MED ORDER — SODIUM POLYSTYRENE SULFONATE 15 GM/60ML PO SUSP: 30.0000 g | Freq: Once | ORAL | Status: DC

## 2016-02-09 NOTE — Telephone Encounter (Signed)
Dr. Derrel Nip aware.

## 2016-02-09 NOTE — Telephone Encounter (Signed)
Her potassium is still high but not critically high and her kidney function is still off.  She will need to stop the lisinopril and take a medication called kayexelate daily  For 2 days to lower her potassium by causing loose stools  I will send in an altenrative BP medication,  Amlodipine  To take daily instead of lisinopril  well.  Repeat [facility] next Wednesday .  She Will need a special test on kidney arteries to see if one of them is blocked.  This may be why the lisinorpil caused this type of reaction .  It's called a renal artery doppler.

## 2016-02-09 NOTE — Telephone Encounter (Signed)
Patient tnoified and voiced understanding,

## 2016-02-10 DIAGNOSIS — E875 Hyperkalemia: Secondary | ICD-10-CM | POA: Insufficient documentation

## 2016-02-10 LAB — HCV RNA QUANT: Hepatitis C Quantitation: NOT DETECTED IU/mL

## 2016-02-10 NOTE — Assessment & Plan Note (Addendum)
Accompanied by increased GFR,  presumed secondary to lisinopril dose increase.  Medication stopped.  Patient notified,   Repeat potassium improved but not back to baseline.  kayexelate daily  for 2 days..  Renal artery ultrasound ordered.

## 2016-02-10 NOTE — Assessment & Plan Note (Signed)
L3, severe , Secondary to degenerative disk disease , aggravated by severe scoliosis and loss of bulk muscle during prior nephrectomy.Suzanne Cole  Has been narcotic dependent since age 65.   Prior referral to Dr. Loistine Chance in September 2014 was done for evaluation of alternative modalities for pain control. Gabapentin, Lyrica, and Cymbalta trials were recommended.  PT was offered but declined due to out of pocket expense of $45/session.  MRI and epidural injections were also offered but deferred by patient.   I will continue to refill her Norco at the current dose and quantity .

## 2016-02-10 NOTE — Assessment & Plan Note (Signed)
Trial of albuterol home nebs.. Samples given today in office and demonstrated use.

## 2016-02-10 NOTE — Assessment & Plan Note (Signed)
Patient's ACE Inhibitor dose was increased several months ago by [place].  Follow up labs today noted critical hyperkalemia.

## 2016-02-10 NOTE — Assessment & Plan Note (Signed)
Adding Singulair to zyrtec

## 2016-03-10 DIAGNOSIS — J449 Chronic obstructive pulmonary disease, unspecified: Secondary | ICD-10-CM | POA: Diagnosis not present

## 2016-03-11 ENCOUNTER — Encounter: Payer: Self-pay | Admitting: Internal Medicine

## 2016-03-12 ENCOUNTER — Other Ambulatory Visit: Payer: Self-pay | Admitting: Internal Medicine

## 2016-03-12 NOTE — Telephone Encounter (Signed)
Pt requesting a refill. Last OV 02/08/16, last filled 12/15/15 #360. Ok to refill?

## 2016-03-13 DIAGNOSIS — I70212 Atherosclerosis of native arteries of extremities with intermittent claudication, left leg: Secondary | ICD-10-CM | POA: Diagnosis not present

## 2016-03-13 DIAGNOSIS — I10 Essential (primary) hypertension: Secondary | ICD-10-CM | POA: Diagnosis not present

## 2016-03-13 DIAGNOSIS — E785 Hyperlipidemia, unspecified: Secondary | ICD-10-CM | POA: Diagnosis not present

## 2016-03-13 NOTE — Telephone Encounter (Signed)
refilled 

## 2016-04-06 ENCOUNTER — Other Ambulatory Visit: Payer: Self-pay | Admitting: Internal Medicine

## 2016-04-09 DIAGNOSIS — J449 Chronic obstructive pulmonary disease, unspecified: Secondary | ICD-10-CM | POA: Diagnosis not present

## 2016-04-17 IMAGING — CR DG CERVICAL SPINE COMPLETE 4+V
5 series · 5 of 5 positions shown · non-contrast
Comparison: None.

CLINICAL DATA: Neck pain. Bilateral hand numbness. Initial
evaluation.

EXAM:
CERVICAL SPINE  4+ VIEWS

[view not recorded (1 of 5)]
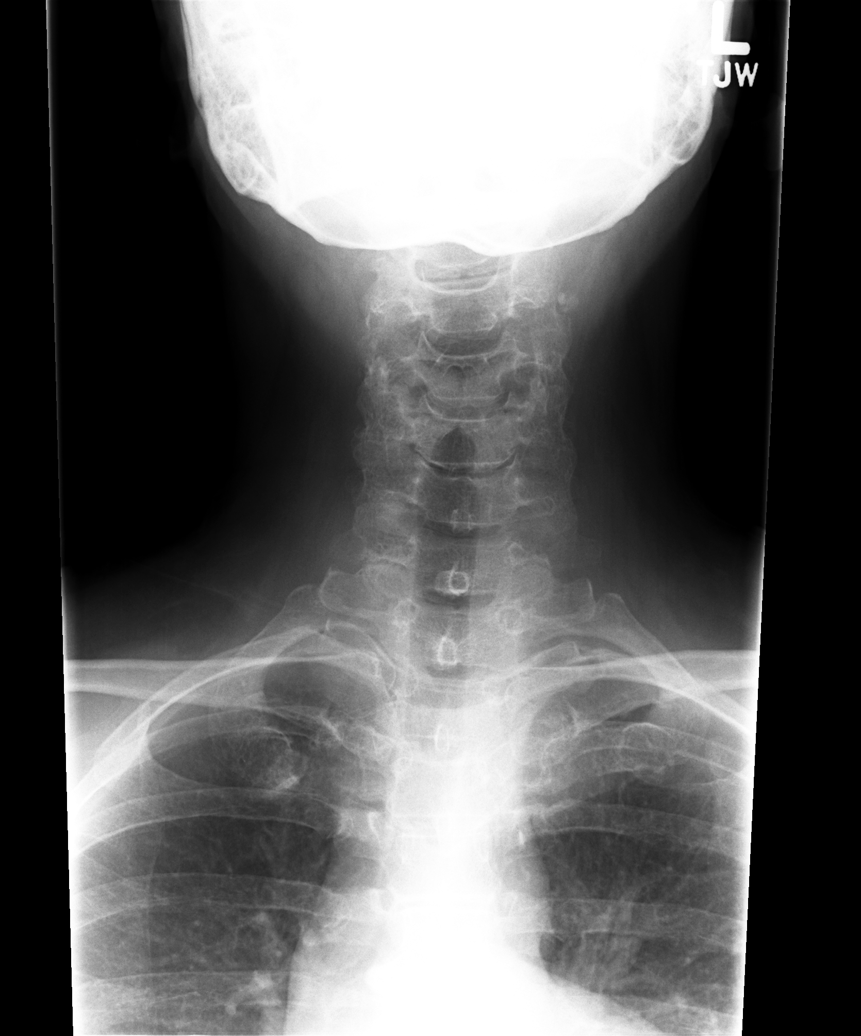

[view not recorded (2 of 5)]
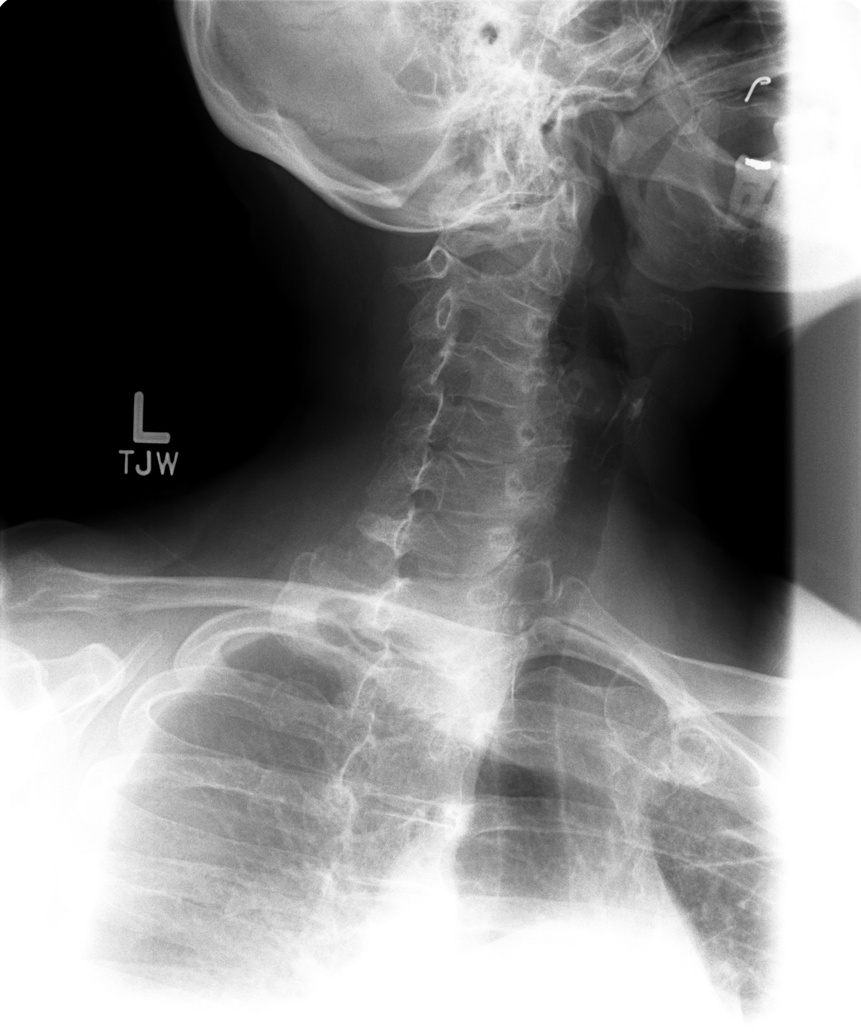

[view not recorded (3 of 5)]
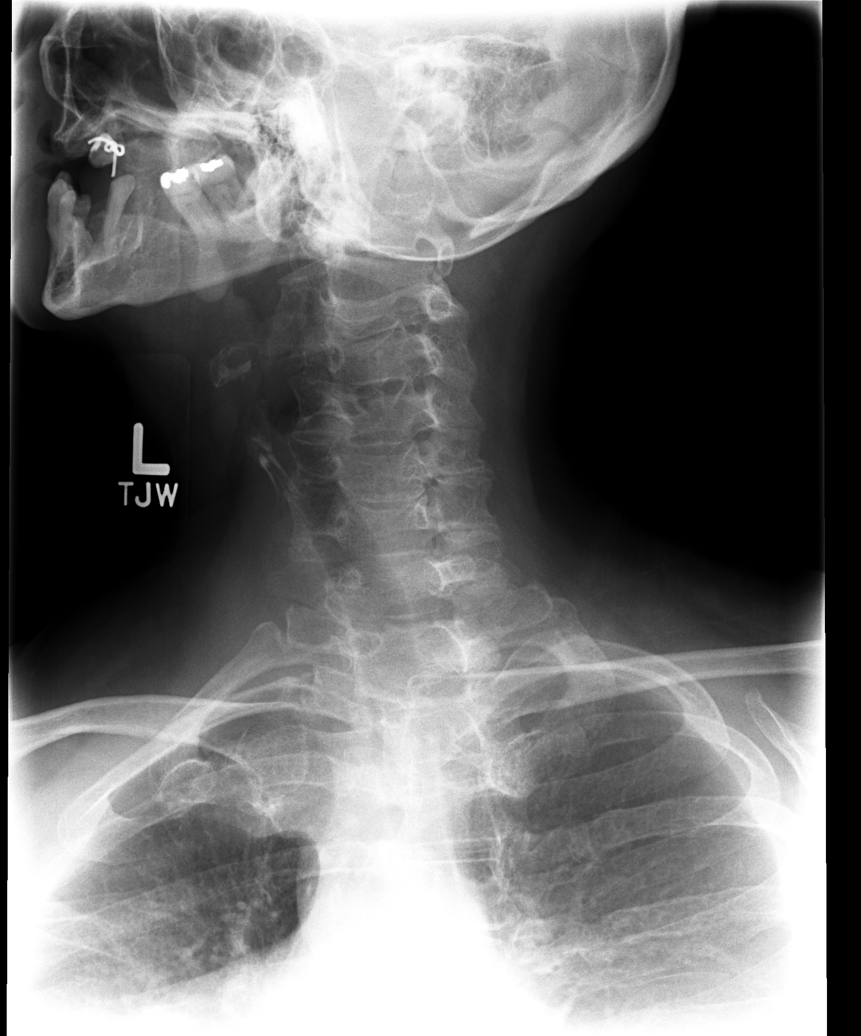

[view not recorded (4 of 5)]
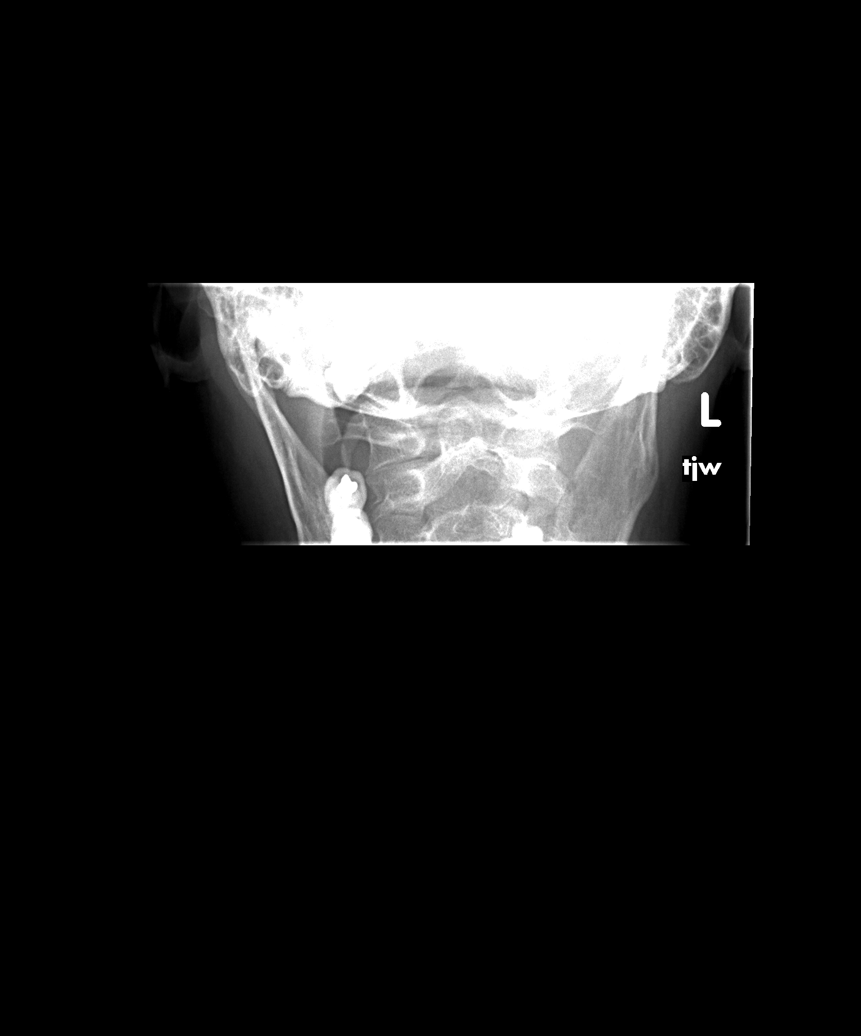

[view not recorded (5 of 5)]
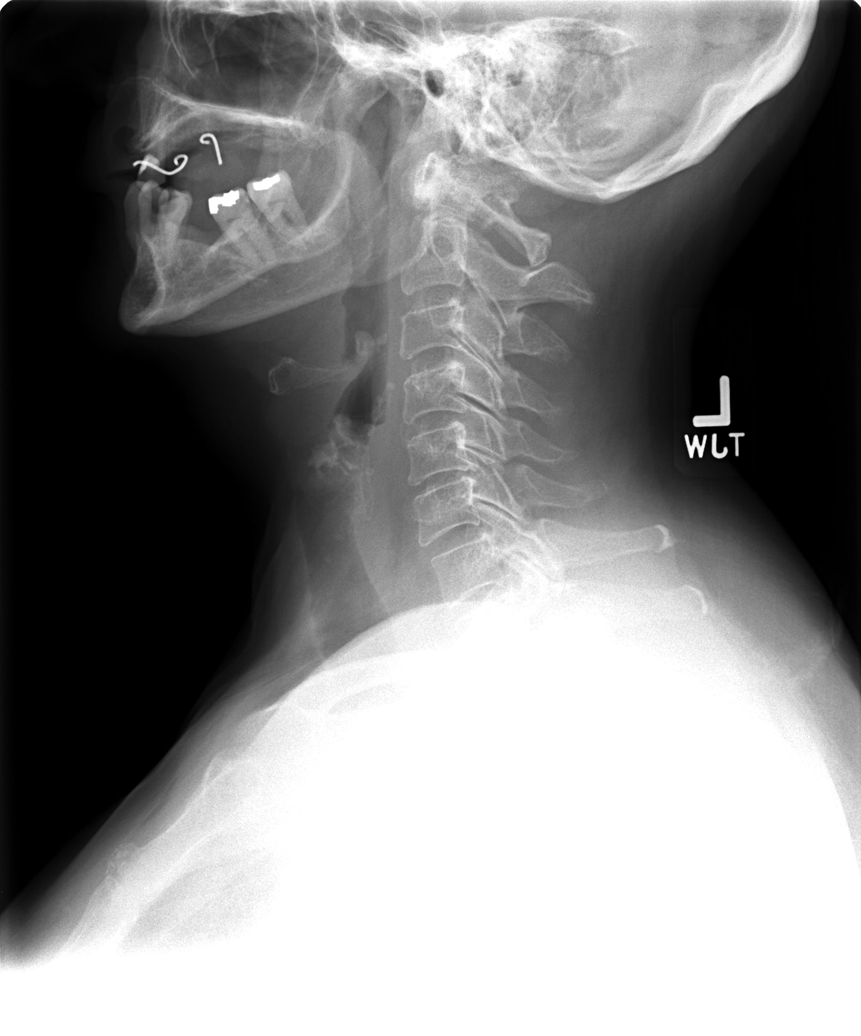

[5 of 5 positions shown; findings below may reference images not displayed]

FINDINGS: Carotid atherosclerotic vascular calcification. Be present.
Pulmonary apices are clear. Diffuse mild degenerative changes
cervical spine. Disc space loss noted C5-C6. Bilateral mild
multifocal degenerative neural foraminal narrowing is present.
IMPRESSION: 1. Probable carotid atherosclerotic vascular disease.
2. Diffuse degenerative changes cervical spine with disc space loss
C5-C6. Bilateral mild degenerative multifocal neural foraminal
narrowing is present.

## 2016-04-26 ENCOUNTER — Telehealth: Payer: Self-pay | Admitting: Cardiovascular Disease

## 2016-04-26 NOTE — Telephone Encounter (Signed)
S/w pt who reports mild swelling on the right side of her neck for the past few days. It began aching yesterday. States she can see in the mirror that it's "a little swollen" with a lump below it. She has been seen by ENT previously for this. Pt states he wanted to surgically remove the mass. She was sent to Dr. Fletcher Anon for surgical clearance Jan 2016 and had a cardiac cath w/stents placed. She did not follow back up w/ENT regarding the mass. Today, she denies SOB, no other swelling. She made a 6 month f/u appt today for July 20. Since pt has been followed by ENT for the neck swelling/mass/lump, I advised her to contact their office. She can not remember her ENT name but states she will look it up. Advised pt to continue to monitor and if she has difficulty breathing or swelling worsens, to be seen in an ED setting. Pt verbalized understanding and is agreeable w/plan. She had no further questions at this time.

## 2016-04-26 NOTE — Telephone Encounter (Signed)
Pt c/o swelling: STAT is pt has developed SOB within 24 hours  1. How long have you been experiencing swelling? Last few days  2. Where is the swelling located? Neck, moving up to her ear  3.  Are you currently taking a "fluid pill"? Not sure  4.  Are you currently SOB? no  5.  Have you traveled recently? no

## 2016-05-06 ENCOUNTER — Other Ambulatory Visit: Payer: Self-pay | Admitting: Internal Medicine

## 2016-05-06 ENCOUNTER — Other Ambulatory Visit: Payer: Self-pay

## 2016-05-06 ENCOUNTER — Telehealth: Payer: Self-pay | Admitting: *Deleted

## 2016-05-06 DIAGNOSIS — Z760 Encounter for issue of repeat prescription: Secondary | ICD-10-CM

## 2016-05-06 MED ORDER — HYDROCODONE-ACETAMINOPHEN 5-325 MG PO TABS: 1.0000 | ORAL_TABLET | Freq: Four times a day (QID) | ORAL | Status: DC | PRN

## 2016-05-06 NOTE — Telephone Encounter (Signed)
Patient has requested to have her hydrocodone refilled. Please call pt.when ready for pick up

## 2016-05-06 NOTE — Telephone Encounter (Signed)
Rx sent to PCP for signature.

## 2016-05-07 NOTE — Telephone Encounter (Signed)
Notified patient scripts ready for pick up and placed at front desk.

## 2016-05-10 DIAGNOSIS — J449 Chronic obstructive pulmonary disease, unspecified: Secondary | ICD-10-CM | POA: Diagnosis not present

## 2016-05-23 ENCOUNTER — Other Ambulatory Visit: Payer: Self-pay | Admitting: Internal Medicine

## 2016-05-30 ENCOUNTER — Ambulatory Visit (INDEPENDENT_AMBULATORY_CARE_PROVIDER_SITE_OTHER): Payer: PPO | Admitting: Family Medicine

## 2016-05-30 ENCOUNTER — Ambulatory Visit (INDEPENDENT_AMBULATORY_CARE_PROVIDER_SITE_OTHER): Payer: PPO | Admitting: Cardiovascular Disease

## 2016-05-30 ENCOUNTER — Encounter: Payer: Self-pay | Admitting: Cardiovascular Disease

## 2016-05-30 ENCOUNTER — Telehealth: Payer: Self-pay | Admitting: Internal Medicine

## 2016-05-30 ENCOUNTER — Encounter: Payer: Self-pay | Admitting: Family Medicine

## 2016-05-30 ENCOUNTER — Telehealth: Payer: Self-pay | Admitting: Cardiovascular Disease

## 2016-05-30 VITALS — BP 170/70 | HR 65 | Ht 59.0 in | Wt 129.8 lb

## 2016-05-30 VITALS — BP 156/78 | HR 75 | Temp 98.4°F | Ht 59.0 in | Wt 130.8 lb

## 2016-05-30 DIAGNOSIS — I6529 Occlusion and stenosis of unspecified carotid artery: Secondary | ICD-10-CM

## 2016-05-30 DIAGNOSIS — E875 Hyperkalemia: Secondary | ICD-10-CM

## 2016-05-30 DIAGNOSIS — W57XXXA Bitten or stung by nonvenomous insect and other nonvenomous arthropods, initial encounter: Secondary | ICD-10-CM | POA: Diagnosis not present

## 2016-05-30 DIAGNOSIS — S40262A Insect bite (nonvenomous) of left shoulder, initial encounter: Secondary | ICD-10-CM | POA: Diagnosis not present

## 2016-05-30 DIAGNOSIS — I2510 Atherosclerotic heart disease of native coronary artery without angina pectoris: Secondary | ICD-10-CM

## 2016-05-30 DIAGNOSIS — T148 Other injury of unspecified body region: Secondary | ICD-10-CM | POA: Diagnosis not present

## 2016-05-30 DIAGNOSIS — I10 Essential (primary) hypertension: Secondary | ICD-10-CM

## 2016-05-30 DIAGNOSIS — E785 Hyperlipidemia, unspecified: Secondary | ICD-10-CM

## 2016-05-30 DIAGNOSIS — R21 Rash and other nonspecific skin eruption: Secondary | ICD-10-CM | POA: Insufficient documentation

## 2016-05-30 LAB — BASIC METABOLIC PANEL
BUN: 23 mg/dL (ref 6–23)
CALCIUM: 9.3 mg/dL (ref 8.4–10.5)
CO2: 25 meq/L (ref 19–32)
CREATININE: 1.26 mg/dL — AB (ref 0.40–1.20)
Chloride: 108 mEq/L (ref 96–112)
GFR: 45.33 mL/min — ABNORMAL LOW (ref >60.00)
GLUCOSE: 132 mg/dL — AB (ref 70–99)
Potassium: 4.7 mEq/L (ref 3.5–5.1)
Sodium: 141 mEq/L (ref 135–145)

## 2016-05-30 MED ORDER — CARVEDILOL 6.25 MG PO TABS: 6.2500 mg | ORAL_TABLET | Freq: Two times a day (BID) | ORAL | Status: DC

## 2016-05-30 MED ORDER — ROSUVASTATIN CALCIUM 40 MG PO TABS: 40.0000 mg | ORAL_TABLET | Freq: Every day | ORAL | Status: DC

## 2016-05-30 MED ORDER — TRIAMCINOLONE ACETONIDE 0.1 % EX CREA: 1.0000 "application " | TOPICAL_CREAM | Freq: Two times a day (BID) | CUTANEOUS | Status: DC

## 2016-05-30 NOTE — Telephone Encounter (Signed)
Patient notified and voiced understanding. Placard at front desk.

## 2016-05-30 NOTE — Assessment & Plan Note (Signed)
Rash likely related to contact dermatitis possibly from the coconut oil. Does not appear consistent with intertrigo. We will treat with topical triamcinolone and she will continue to monitor. See AVS for return precautions.

## 2016-05-30 NOTE — Assessment & Plan Note (Signed)
Tick bite appears to be well healing. No signs of infection. Has had several tick bites over the last 2 years. Wonder if this could be contributing to her chronic tiredness and thus we'll order Lyme titers. No signs of acute illness. If Lyme titers are positive would treat for Lyme disease. If negative she'll continue to monitor and continue to follow-up with cardiology and her PCP for her tiredness.

## 2016-05-30 NOTE — Patient Instructions (Addendum)
Medication Instructions:  Your physician has recommended you make the following change in your medication:  STOP taking metoprolol START taking coreg 6.25mg  twice daily STOP taking atorvastatin START taking rosuvastatin 40mg  once daily   Labwork: Fasting lipid and liver profile in 6 weeks. Nothing to eat or drink after midnight the evening before your labs.   Testing/Procedures: Your physician has requested that you have a carotid duplex. This test is an ultrasound of the carotid arteries in your neck. It looks at blood flow through these arteries that supply the brain with blood. Allow one hour for this exam. There are no restrictions or special instructions.    Follow-Up: Your physician recommends that you schedule a follow-up appointment in: 3 months with Dr. Fletcher Anon.    Any Other Special Instructions Will Be Listed Below (If Applicable).     If you need a refill on your cardiac medications before your next appointment, please call your pharmacy.

## 2016-05-30 NOTE — Patient Instructions (Signed)
Nice to meet you. We will test for Lyme disease given your numerous tick bites. We will treat your rash with the topical steroid. If you develop fevers, chills, nausea, vomiting, spreading rash, or any new or change in symptoms please seek medical attention.

## 2016-05-30 NOTE — Telephone Encounter (Signed)
Pt dropped off Disability parking placard to be filled out.. Placed in Dr. Demetrios Isaacs box.. Please advise when ready

## 2016-05-30 NOTE — Assessment & Plan Note (Signed)
Elevated in the past. Did not return for recheck. Future orders had already been placed by her PCP and these will be drawn today.

## 2016-05-30 NOTE — Progress Notes (Signed)
  Tommi Rumps, MD Phone: [phone]  Suzanne Cole is a 65 y.o. female who presents today for same-day visit.  Patient notes being bitten by a tick 1-2 weeks ago on her left posterior shoulder. She felt it bite her and immediately removed it. Notes it was a deer tick. Was not engorged. Denies rash. That she may feel more tired than usual though she has tiredness at baseline. Did see her cardiologist today who changed her blood pressure medications try to improve blood pressure control. She notes being bitten by several ticks last year and one earlier this year. Notes tiredness is a chronic issue. Wonders if she should be tested for Lyme disease.  She does note a rash underneath her breasts bilaterally as well. For less than a week. Did itch initially. Started after using coconut oil and being out in the heat. Using an antihistamine cream. Notes it is getting better on the right though has become an issue on the left as well. No spreading rash. No fevers. No chills. Rash had some vesicular-like lesions to it initially.  Patient additionally notes she needs her potassium rechecked. Had been high previously. Appears she was supposed to return though never did for recheck. This will be drawn today as there was already a future order placed.  PMH: Former smoker   ROS see history of present illness  Objective  Physical Exam Filed Vitals:   05/30/16 1029  BP: 156/78  Pulse: 75  Temp: 98.4 F (36.9 C)    BP Readings from Last 3 Encounters:  05/30/16 156/78  05/30/16 170/70  02/08/16 128/70   Wt Readings from Last 3 Encounters:  05/30/16 130 lb 12.8 oz (59.33 kg)  05/30/16 129 lb 12.8 oz (58.877 kg)  02/08/16 125 lb 8 oz (56.926 kg)    Physical Exam  Constitutional: No distress.  HENT:  Head: Normocephalic and atraumatic.  Cardiovascular: Normal rate, regular rhythm and normal heart sounds.   Pulmonary/Chest: Effort normal and breath sounds normal.    Neurological: She is  alert. Gait normal.  Skin: Skin is warm and dry. She is not diaphoretic.  Left posterior upper back with small excoriated papule with no surrounding erythema or tenderness     Assessment/Plan: Please see individual problem list.  Rash and nonspecific skin eruption Rash likely related to contact dermatitis possibly from the coconut oil. Does not appear consistent with intertrigo. We will treat with topical triamcinolone and she will continue to monitor. See AVS for return precautions.  Tick bite Tick bite appears to be well healing. No signs of infection. Has had several tick bites over the last 2 years. Wonder if this could be contributing to her chronic tiredness and thus we'll order Lyme titers. No signs of acute illness. If Lyme titers are positive would treat for Lyme disease. If negative she'll continue to monitor and continue to follow-up with cardiology and her PCP for her tiredness.  Hyperkalemia Elevated in the past. Did not return for recheck. Future orders had already been placed by her PCP and these will be drawn today.    Orders Placed This Encounter  Procedures  . Lyme Aby, Western Blot IgG & IgM w/bands    Meds ordered this encounter  Medications  . triamcinolone cream (KENALOG) 0.1 %    Sig: Apply 1 application topically 2 (two) times daily.    Dispense:  30 g    Refill:  0   Tommi Rumps, MD [place]
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
## 2016-05-30 NOTE — Progress Notes (Signed)
Pre visit review using our clinic review tool, if applicable. No additional management support is needed unless otherwise documented below in the visit note. 

## 2016-05-30 NOTE — Telephone Encounter (Signed)
Went over patient's AVS instructions from her office visit with Dr. Fletcher Anon this morning. Will mail patient a copy of instructions. Patient verbalized understanding.

## 2016-05-30 NOTE — Telephone Encounter (Signed)
Patient says she was just here this morning and lost her avs.  Please call her to discuss 2 medications she is supposed to stop taking.  She cannot remember .

## 2016-06-02 ENCOUNTER — Encounter: Payer: Self-pay | Admitting: Internal Medicine

## 2016-06-03 ENCOUNTER — Other Ambulatory Visit: Payer: Self-pay | Admitting: Cardiovascular Disease

## 2016-06-05 DIAGNOSIS — L309 Dermatitis, unspecified: Secondary | ICD-10-CM | POA: Diagnosis not present

## 2016-06-05 LAB — LYME ABY, WSTRN BLT IGG & IGM W/BANDS
B BURGDORFERI IGG ABS (IB): NEGATIVE
B BURGDORFERI IGM ABS (IB): NEGATIVE
LYME DISEASE 23 KD IGM: NONREACTIVE
LYME DISEASE 30 KD IGG: NONREACTIVE
LYME DISEASE 39 KD IGG: NONREACTIVE
LYME DISEASE 41 KD IGG: NONREACTIVE
LYME DISEASE 58 KD IGG: NONREACTIVE
LYME DISEASE 66 KD IGG: NONREACTIVE
LYME DISEASE 93 KD IGG: NONREACTIVE
Lyme Disease 18 kD IgG: NONREACTIVE
Lyme Disease 23 kD IgG: NONREACTIVE
Lyme Disease 28 kD IgG: NONREACTIVE
Lyme Disease 39 kD IgM: NONREACTIVE
Lyme Disease 41 kD IgM: NONREACTIVE
Lyme Disease 45 kD IgG: NONREACTIVE

## 2016-06-06 ENCOUNTER — Emergency Department
Admission: EM | Admit: 2016-06-06 | Discharge: 2016-06-06 | Disposition: A | Discharge status: Home / Self Care | Payer: PPO | Attending: Student | Admitting: Student

## 2016-06-06 DIAGNOSIS — Z7982 Long term (current) use of aspirin: Secondary | ICD-10-CM | POA: Diagnosis not present

## 2016-06-06 DIAGNOSIS — Z87891 Personal history of nicotine dependence: Secondary | ICD-10-CM | POA: Insufficient documentation

## 2016-06-06 DIAGNOSIS — Z955 Presence of coronary angioplasty implant and graft: Secondary | ICD-10-CM | POA: Diagnosis not present

## 2016-06-06 DIAGNOSIS — I2510 Atherosclerotic heart disease of native coronary artery without angina pectoris: Secondary | ICD-10-CM | POA: Diagnosis not present

## 2016-06-06 DIAGNOSIS — Z79899 Other long term (current) drug therapy: Secondary | ICD-10-CM | POA: Diagnosis not present

## 2016-06-06 DIAGNOSIS — I10 Essential (primary) hypertension: Secondary | ICD-10-CM | POA: Insufficient documentation

## 2016-06-06 DIAGNOSIS — R21 Rash and other nonspecific skin eruption: Secondary | ICD-10-CM | POA: Diagnosis not present

## 2016-06-06 DIAGNOSIS — E785 Hyperlipidemia, unspecified: Secondary | ICD-10-CM | POA: Insufficient documentation

## 2016-06-06 DIAGNOSIS — L259 Unspecified contact dermatitis, unspecified cause: Secondary | ICD-10-CM | POA: Insufficient documentation

## 2016-06-06 MED ORDER — METHYLPREDNISOLONE 4 MG PO TBPK: ORAL_TABLET | ORAL | 0 refills | Status: DC

## 2016-06-06 MED ORDER — HYDROXYZINE HCL 25 MG PO TABS: 25.0000 mg | ORAL_TABLET | Freq: Three times a day (TID) | ORAL | 0 refills | Status: DC | PRN

## 2016-06-06 MED ORDER — DEXAMETHASONE SODIUM PHOSPHATE 10 MG/ML IJ SOLN
10.0000 mg | Freq: Once | INTRAMUSCULAR | Status: AC
  Administered 2016-06-06: 10 mg via INTRAMUSCULAR
  Filled 2016-06-06: qty 1
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
## 2016-06-06 NOTE — ED Triage Notes (Signed)
Pt c/o itchy rash since 7/17 and has went to PCP multiple times without any relief from Rx meds.

## 2016-06-06 NOTE — Discharge Instructions (Signed)
Continue previous medication hydroxyzine 50 mg which is 2 tablets 3 times a day until all gone. Then start hydroxyzine at 25 mg which is one tablet 3 times a day for 5 days

## 2016-06-06 NOTE — ED Notes (Signed)
Presents with generalized rash  Developed rash on the 17th  Has been seen by PCP and given cream for rash  Then was seen bu urgentt care yesterday and started on prednisone and vistaril   States rash is worse today

## 2016-06-07 ENCOUNTER — Encounter: Payer: Self-pay | Admitting: Emergency Medicine

## 2016-06-07 ENCOUNTER — Emergency Department: Payer: PPO

## 2016-06-07 ENCOUNTER — Emergency Department
Admission: EM | Admit: 2016-06-07 | Discharge: 2016-06-07 | Disposition: A | Discharge status: Home / Self Care | Payer: PPO | Attending: Emergency Medicine | Admitting: Emergency Medicine

## 2016-06-07 DIAGNOSIS — I2510 Atherosclerotic heart disease of native coronary artery without angina pectoris: Secondary | ICD-10-CM | POA: Insufficient documentation

## 2016-06-07 DIAGNOSIS — Z955 Presence of coronary angioplasty implant and graft: Secondary | ICD-10-CM | POA: Insufficient documentation

## 2016-06-07 DIAGNOSIS — L309 Dermatitis, unspecified: Secondary | ICD-10-CM | POA: Diagnosis not present

## 2016-06-07 DIAGNOSIS — Z87891 Personal history of nicotine dependence: Secondary | ICD-10-CM | POA: Diagnosis not present

## 2016-06-07 DIAGNOSIS — I10 Essential (primary) hypertension: Secondary | ICD-10-CM | POA: Diagnosis not present

## 2016-06-07 DIAGNOSIS — Z7982 Long term (current) use of aspirin: Secondary | ICD-10-CM | POA: Insufficient documentation

## 2016-06-07 DIAGNOSIS — R1310 Dysphagia, unspecified: Secondary | ICD-10-CM | POA: Diagnosis not present

## 2016-06-07 DIAGNOSIS — I739 Peripheral vascular disease, unspecified: Secondary | ICD-10-CM | POA: Insufficient documentation

## 2016-06-07 DIAGNOSIS — Z79899 Other long term (current) drug therapy: Secondary | ICD-10-CM | POA: Diagnosis not present

## 2016-06-07 DIAGNOSIS — L539 Erythematous condition, unspecified: Secondary | ICD-10-CM | POA: Diagnosis not present

## 2016-06-07 MED ORDER — FAMOTIDINE 20 MG PO TABS
20.0000 mg | ORAL_TABLET | ORAL | Status: AC
  Administered 2016-06-07: 20 mg via ORAL
  Filled 2016-06-07: qty 1

## 2016-06-07 MED ORDER — EPINEPHRINE 0.3 MG/0.3ML IJ SOAJ: 0.3000 mg | Freq: Once | INTRAMUSCULAR | 0 refills | Status: AC

## 2016-06-07 MED ORDER — DIPHENHYDRAMINE HCL 25 MG PO CAPS
50.0000 mg | ORAL_CAPSULE | ORAL | Status: AC
  Administered 2016-06-07: 50 mg via ORAL
  Filled 2016-06-07: qty 2

## 2016-06-07 NOTE — ED Notes (Signed)
Patient transported to X-ray 

## 2016-06-07 NOTE — Discharge Instructions (Signed)
Continue your current steroid dose pack.  You have been seen in the Emergency Department (ED) today for an allergic reaction.  You have been stable throughout your stay in the Emergency Department.  Please take your medications as prescribed and follow up with your doctor as indicated.  You should also take over-the-counter Benadryl around the clock for the next three days according to the dosing instructions on the package.  Please keep your Epi-Pen with you at all times and use it if experience shortness of breath or difficulty breathing or if you believe you are having a severe allergic reaction.  If you use the Epi-Pen, though, please call 911 afterwards or go immediately to your nearest Emergency Department.  Return to the Emergency Department (ED) if you experience any worsening or new symptoms that concern you.  I recommend you not drive tonight or if benadryl is making you drowsy.

## 2016-06-07 NOTE — ED Notes (Signed)
Pt able to speak clearly and swallow secretions.  NAD.  Pt c/o feelings something "stuck in throat".  Pt also sts rash has gotten worse.  Pt does have rash to limbs, neck and trunk. Pt sts she has been taken medication as rx.

## 2016-06-07 NOTE — ED Triage Notes (Signed)
Pt started with rash 7/17 and saw PCP/urgent care with no answer and came here 7/27 with dx po[place] ivy. Pt reports getting worse today. Rash has gotten a lot worse. Pt reports feels like a lump in throat and was unable to swallow a piece of spaghetti noodle because feels like it gets stuck. Can still swallow secretions. Feels like hard to get a breath.
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
## 2016-06-07 NOTE — ED Notes (Signed)
MD at bedside. 

## 2016-06-09 DIAGNOSIS — J449 Chronic obstructive pulmonary disease, unspecified: Secondary | ICD-10-CM | POA: Diagnosis not present

## 2016-06-11 DIAGNOSIS — L508 Other urticaria: Secondary | ICD-10-CM | POA: Diagnosis not present

## 2016-06-23 ENCOUNTER — Other Ambulatory Visit: Payer: Self-pay | Admitting: Physician Assistant

## 2016-06-23 DIAGNOSIS — I25810 Atherosclerosis of coronary artery bypass graft(s) without angina pectoris: Secondary | ICD-10-CM

## 2016-06-24 DIAGNOSIS — I701 Atherosclerosis of renal artery: Secondary | ICD-10-CM | POA: Diagnosis not present

## 2016-06-24 DIAGNOSIS — I10 Essential (primary) hypertension: Secondary | ICD-10-CM | POA: Diagnosis not present

## 2016-06-24 DIAGNOSIS — I70212 Atherosclerosis of native arteries of extremities with intermittent claudication, left leg: Secondary | ICD-10-CM | POA: Diagnosis not present

## 2016-06-24 DIAGNOSIS — M79609 Pain in unspecified limb: Secondary | ICD-10-CM | POA: Diagnosis not present

## 2016-06-24 DIAGNOSIS — E785 Hyperlipidemia, unspecified: Secondary | ICD-10-CM | POA: Diagnosis not present

## 2016-07-03 ENCOUNTER — Telehealth: Payer: Self-pay | Admitting: *Deleted

## 2016-07-03 NOTE — Telephone Encounter (Signed)
Patient has requested to know the last referral that she cancelled, due to her having pos ion ivy. She could not remember the location of the place.

## 2016-07-10 ENCOUNTER — Other Ambulatory Visit (INDEPENDENT_AMBULATORY_CARE_PROVIDER_SITE_OTHER): Payer: PPO

## 2016-07-10 DIAGNOSIS — J449 Chronic obstructive pulmonary disease, unspecified: Secondary | ICD-10-CM | POA: Diagnosis not present

## 2016-07-10 DIAGNOSIS — I2510 Atherosclerotic heart disease of native coronary artery without angina pectoris: Secondary | ICD-10-CM | POA: Diagnosis not present

## 2016-07-11 ENCOUNTER — Other Ambulatory Visit: Payer: Self-pay

## 2016-07-11 LAB — LIPID PANEL
CHOL/HDL RATIO: 3.5 ratio (ref 0.0–4.4)
Cholesterol, Total: 181 mg/dL (ref 100–199)
HDL: 51 mg/dL (ref >39)
LDL CALC: 75 mg/dL (ref 0–99)
Triglycerides: 274 mg/dL — ABNORMAL HIGH (ref 0–149)
VLDL CHOLESTEROL CAL: 55 mg/dL — AB (ref 5–40)

## 2016-07-11 LAB — HEPATIC FUNCTION PANEL
ALT: 14 IU/L (ref 0–32)
AST: 21 IU/L (ref 0–40)
Albumin: 4.1 g/dL (ref 3.6–4.8)
Alkaline Phosphatase: 54 IU/L (ref 39–117)
Bilirubin Total: 0.5 mg/dL (ref 0.0–1.2)
Bilirubin, Direct: 0.09 mg/dL (ref 0.00–0.40)
TOTAL PROTEIN: 6.7 g/dL (ref 6.0–8.5)

## 2016-07-11 MED ORDER — FISH OIL 1000 MG PO CAPS: 3000.0000 mg | ORAL_CAPSULE | Freq: Every day | ORAL | 0 refills | Status: DC

## 2016-07-18 ENCOUNTER — Ambulatory Visit: Payer: PPO

## 2016-07-18 ENCOUNTER — Other Ambulatory Visit: Payer: Self-pay | Admitting: Cardiovascular Disease

## 2016-07-18 DIAGNOSIS — I2510 Atherosclerotic heart disease of native coronary artery without angina pectoris: Secondary | ICD-10-CM

## 2016-07-18 DIAGNOSIS — I6523 Occlusion and stenosis of bilateral carotid arteries: Secondary | ICD-10-CM

## 2016-08-06 ENCOUNTER — Other Ambulatory Visit: Payer: Self-pay | Admitting: *Deleted

## 2016-08-06 MED ORDER — HYDROCODONE-ACETAMINOPHEN 5-325 MG PO TABS: 1.0000 | ORAL_TABLET | Freq: Four times a day (QID) | ORAL | 0 refills | Status: DC | PRN

## 2016-08-06 NOTE — Telephone Encounter (Signed)
Patient has requested a medication refill for hydrocodone  Pt contact [phone]

## 2016-08-06 NOTE — Telephone Encounter (Signed)
Refilled 05/06/16, pt last seen 02/08/16 for office visit. Was seen for acute visit by Dr.Sonnenberg on 05/30/16. Please advise?

## 2016-08-06 NOTE — Telephone Encounter (Signed)
Refill for 30 days only.  OFFICE VISIT NEEDED every 3 months  FOR REFILLS ON NARCOTICS/

## 2016-08-07 ENCOUNTER — Ambulatory Visit (INDEPENDENT_AMBULATORY_CARE_PROVIDER_SITE_OTHER): Payer: PPO

## 2016-08-07 DIAGNOSIS — Z23 Encounter for immunization: Secondary | ICD-10-CM

## 2016-08-07 NOTE — Telephone Encounter (Signed)
Front desk scheduled pt 10/31 but pt was made aware to make 3 month appt after October appointment.

## 2016-08-10 DIAGNOSIS — J449 Chronic obstructive pulmonary disease, unspecified: Secondary | ICD-10-CM | POA: Diagnosis not present

## 2016-08-19 ENCOUNTER — Telehealth: Payer: Self-pay

## 2016-08-19 NOTE — Telephone Encounter (Signed)
PA for methocarbamol completed on Cover mymeds.

## 2016-08-22 ENCOUNTER — Encounter: Payer: Self-pay | Admitting: Cardiovascular Disease

## 2016-08-22 ENCOUNTER — Ambulatory Visit (INDEPENDENT_AMBULATORY_CARE_PROVIDER_SITE_OTHER): Payer: PPO | Admitting: Cardiovascular Disease

## 2016-08-22 VITALS — BP 140/70 | HR 75 | Ht 59.0 in | Wt 131.8 lb

## 2016-08-22 DIAGNOSIS — I2510 Atherosclerotic heart disease of native coronary artery without angina pectoris: Secondary | ICD-10-CM | POA: Diagnosis not present

## 2016-08-22 DIAGNOSIS — E782 Mixed hyperlipidemia: Secondary | ICD-10-CM | POA: Diagnosis not present

## 2016-08-22 DIAGNOSIS — I10 Essential (primary) hypertension: Secondary | ICD-10-CM | POA: Diagnosis not present

## 2016-08-22 MED ORDER — CARVEDILOL 12.5 MG PO TABS: 12.5000 mg | ORAL_TABLET | Freq: Two times a day (BID) | ORAL | 5 refills | Status: DC
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
## 2016-08-22 NOTE — Patient Instructions (Signed)
Medication Instructions:  Your physician has recommended you make the following change in your medication:  INCREASE coreg to 12.5mg  twice daily   Labwork: none  Testing/Procedures: none  Follow-Up: Your physician wants you to follow-up in: 6 months with Dr. Fletcher Anon.  You will receive a reminder letter in the mail two months in advance. If you don't receive a letter, please call our office to schedule the follow-up appointment.   Any Other Special Instructions Will Be Listed Below (If Applicable).     If you need a refill on your cardiac medications before your next appointment, please call your pharmacy.

## 2016-08-23 NOTE — Telephone Encounter (Signed)
PA denied, it was ran twice and denied both times.

## 2016-08-26 ENCOUNTER — Telehealth: Payer: Self-pay | Admitting: Internal Medicine

## 2016-08-26 NOTE — Telephone Encounter (Signed)
Pt called and stated that she needs a refill on methocarbamol (ROBAXIN) 750 MG tablet. She also stated that the insurance company needs to hear from [place] in order to refill it.   Call pt @ [phone]  Pharmacy - [facility] - [place], [place]

## 2016-08-26 NOTE — Telephone Encounter (Signed)
Robaxin was denied by her insurance.. Is she having muscle spasm? We can resubmit AP with this diagnosis if so.,

## 2016-08-26 NOTE — Telephone Encounter (Signed)
insurance company number is [phone]

## 2016-08-26 NOTE — Telephone Encounter (Signed)
PA completed on Covermymeds. thanks

## 2016-08-26 NOTE — Telephone Encounter (Signed)
Patient needs PA for robaxin.

## 2016-08-27 NOTE — Telephone Encounter (Signed)
Noted thanks °

## 2016-08-27 NOTE — Telephone Encounter (Signed)
This was denied twice now, once in March and now again.  Please advise, I can submit an appeal if you would like.  Patient is currently on Flexeril and vicodin.  thanks

## 2016-08-27 NOTE — Telephone Encounter (Signed)
NO. SHE SOUL NOT BE TAKING TWO MUSCLE RELAXERS AT SAME TIME.  FLEXERIL IS A MUSCLE RELAXER

## 2016-08-30 ENCOUNTER — Encounter: Payer: Self-pay | Admitting: Internal Medicine

## 2016-09-09 DIAGNOSIS — J449 Chronic obstructive pulmonary disease, unspecified: Secondary | ICD-10-CM | POA: Diagnosis not present

## 2016-09-10 ENCOUNTER — Ambulatory Visit (INDEPENDENT_AMBULATORY_CARE_PROVIDER_SITE_OTHER): Payer: PPO | Admitting: Internal Medicine

## 2016-09-10 DIAGNOSIS — Z1211 Encounter for screening for malignant neoplasm of colon: Secondary | ICD-10-CM

## 2016-09-10 DIAGNOSIS — E875 Hyperkalemia: Secondary | ICD-10-CM | POA: Diagnosis not present

## 2016-09-10 DIAGNOSIS — M5416 Radiculopathy, lumbar region: Secondary | ICD-10-CM

## 2016-09-10 DIAGNOSIS — Z0000 Encounter for general adult medical examination without abnormal findings: Secondary | ICD-10-CM

## 2016-09-10 DIAGNOSIS — E785 Hyperlipidemia, unspecified: Secondary | ICD-10-CM | POA: Diagnosis not present

## 2016-09-10 DIAGNOSIS — I6522 Occlusion and stenosis of left carotid artery: Secondary | ICD-10-CM

## 2016-09-10 DIAGNOSIS — C641 Malignant neoplasm of right kidney, except renal pelvis: Secondary | ICD-10-CM

## 2016-09-10 MED ORDER — HYDROCODONE-ACETAMINOPHEN 5-325 MG PO TABS: 1.0000 | ORAL_TABLET | Freq: Four times a day (QID) | ORAL | 0 refills | Status: DC | PRN

## 2016-09-10 NOTE — Assessment & Plan Note (Addendum)
Secondary to lisinopril and concurrent use of narcotics.  Lisinopril has been  Stopped and listed as an allergy.   Renal artery stenosis ruled out with renal ultrasound done by [facility] in  May 2017 (she is s/p right nephrectomy)

## 2016-09-10 NOTE — Assessment & Plan Note (Signed)
Wants to do cologuard.

## 2016-09-10 NOTE — Assessment & Plan Note (Addendum)
Managed with vicodin .  Refills  For Oct 30,  Nov 30 and Dec 30th given today after Refill history confirmed via [place] Controlled Substance databas, accessed by me today.. 3 month follow up needed.

## 2016-09-10 NOTE — Progress Notes (Signed)
Patient ID: Suzanne Cole, female    DOB: [birth year]  Age: 65 y.o. MRN: [id number]  The patient is here for annual Medicare wellness examination and management of other chronic and acute problems    S/pt total hysterectomy;  Confirmed by last PAP 2011 no endocervical cells.  Overdue for colonoscopy last one 2005.  Wants to use  cologuard.  No history of adenomas, no FH of colon CA  Mammogram normal March 2017  Had renal arteries checked  by [place] vein a few months ago during workup for hyperkalemia attributed to use of lisinopril  .   Had carotids checked as well  Had cardiology evaluation   The risk factors are reflected in the social history. And include hitory of  tobacco abuse  The roster of all physicians providing medical care to patient - is listed in the Snapshot section of the chart.  Activities of daily living:  The patient is 100% independent in all ADLs: dressing, toileting, feeding as well as independent mobility  Home safety : The patient has smoke detectors in the home. They wear seatbelts.  There are no firearms at home. There is no violence in the home.   There is no risks for hepatitis, STDs or HIV. There is no   history of blood transfusion. They have no travel history to infectious disease endemic areas of the world.  The patient has seen their dentist in the last six month. They have seen their eye doctor in the last year. They admit to slight hearing difficulty with regard to whispered voices and some television programs.  They have deferred audiologic testing in the last year.  They do not  have excessive sun exposure. Discussed the need for sun protection: hats, long sleeves and use of sunscreen if there is significant sun exposure.   Diet: the importance of a healthy diet is discussed. They do have a healthy diet.  The benefits of regular aerobic exercise were discussed. She walks 4 times per week ,  20 minutes.   Depression screen: there are no signs or  vegative symptoms of depression- irritability, change in appetite, anhedonia, sadness/tearfullness.  Cognitive assessment: the patient manages all their financial and personal affairs and is actively engaged. They could relate day,date,year and events; recalled 2/3 objects at 3 minutes; performed clock-face test normally.  The following portions of the patient's history were reviewed and updated as appropriate: allergies, current medications, past family history, past medical history,  past surgical history, past social history  and problem list.  Visual acuity was not assessed per patient preference since she has regular follow up with her ophthalmologist. Hearing and body mass index were assessed and reviewed.   During the course of the visit the patient was educated and counseled about appropriate screening and preventive services including : fall prevention , diabetes screening, nutrition counseling, colorectal cancer screening, and recommended immunizations.    CC: Diagnoses of Screening for colon cancer, Hyperkalemia, Lumbar radiculitis, Hyperlipidemia LDL goal <70, Nephroblastoma of right kidney [facility]), Routine general medical examination at a health care facility, Asymptomatic stenosis of left carotid artery without infarction, and Atherosclerosis of left carotid artery were pertinent to this visit.  Had tongue swelling to flexeril. Paying for Robaxin out of pocket $50 due to lack of insurance coverage.   She continues to have persistent neck and back pain managed with daily use of vicodin.  due to degenerative arthritis of the spine. Refill on Vicodin requested. Refill history has been confirmed via [place]  Controlled Substance database, accessed by me today..  sleeping well,  Remains tobacco abstinent  Occasional  constipation managed   Vitamin D supplement :  2  chewies daily     History Sharonann has a past medical history of Allergy; Coronary artery disease; Hernia; Hyperlipidemia;  Hypertension; IBS (irritable bowel syndrome); Lumbago; Scoliosis; tobacco abuse; and Wilm's tumor (age 16 months).   She has a past surgical history that includes Septoplasty; total nephrectomy (1954); laparoscopy (7017); Shoulder arthroscopy; Salpingectomy (1980); Hernia repair (1993); Abdominal hysterectomy (1981); Multiple tooth extractions; and Coronary angioplasty with stent (Left, Jan 2016).   Her family history includes BRCA 1/2 in her mother; Cancer in her father; Coronary artery disease in her father; Diabetes in her brother; Heart Problems in her father.She reports that she quit smoking about 5 years ago. Her smoking use included Cigars. She has never used smokeless tobacco. She reports that she drinks alcohol. She reports that she does not use drugs.  Outpatient Medications Prior to Visit  Medication Sig Dispense Refill  . albuterol (PROVENTIL) (2.5 MG/3ML) 0.083% nebulizer solution Take 3 mLs (2.5 mg total) by nebulization every 6 (six) hours as needed for wheezing or shortness of breath. 150 mL 1  . alendronate (FOSAMAX) 70 MG tablet TAKE ONE TABLET BY MOUTH ONCE A WEEK WITH  WATER  TAKE  ON  AN  EMPTY  STOMACH 4 tablet 0  . amLODipine (NORVASC) 5 MG tablet Take 1 tablet (5 mg total) by mouth daily. For blood pressure 90 tablet 1  . aspirin 81 MG tablet Take 81 mg by mouth as needed for pain.    . benzonatate (TESSALON) 200 MG capsule take 1 capsule by mouth three times a day if needed  0  . carvedilol (COREG) 12.5 MG tablet Take 1 tablet (12.5 mg total) by mouth 2 (two) times daily. 60 tablet 5  . cetirizine (ZYRTEC) 10 MG tablet Take 10 mg by mouth daily.    . clopidogrel (PLAVIX) 75 MG tablet TAKE ONE TABLET BY MOUTH ONCE DAILY 90 tablet 3  . gabapentin (NEURONTIN) 300 MG capsule TAKE ONE CAPSULE BY MOUTH TWICE DAILY 180 capsule 1  . hydrOXYzine (ATARAX/VISTARIL) 25 MG tablet Take 1 tablet (25 mg total) by mouth 3 (three) times daily as needed. 15 tablet 0  . meloxicam (MOBIC) 15 MG  tablet TAKE 1 TABLET EVERY DAY 90 tablet 6  . methocarbamol (ROBAXIN) 750 MG tablet TAKE 1 TABLET BY MOUTH FOUR TIMES DAILY 360 tablet 1  . methylPREDNISolone (MEDROL DOSEPAK) 4 MG TBPK tablet Take Tapered dose as directed 21 tablet 0  . montelukast (SINGULAIR) 10 MG tablet Take 1 tablet (10 mg total) by mouth at bedtime. For seasonal allergies 30 tablet 5  . nitroGLYCERIN (NITROSTAT) 0.3 MG SL tablet Place 1 tablet (0.3 mg total) under the tongue every 5 (five) minutes as needed for chest pain. 30 tablet 0  . Omega-3 Fatty Acids (FISH OIL) 1000 MG CAPS Take 3 capsules (3,000 mg total) by mouth daily. 90 capsule 0  . pantoprazole (PROTONIX) 40 MG tablet TAKE ONE TABLET BY MOUTH ONCE DAILY 30 tablet 3  . Phenylephrine-Acetaminophen 5-325 MG TABS as needed.     . ranitidine (ZANTAC) 150 MG tablet TAKE 1 TABLET(150 MG) BY MOUTH TWICE DAILY 60 tablet 3  . rosuvastatin (CRESTOR) 40 MG tablet Take 1 tablet (40 mg total) by mouth daily. 30 tablet 3  . sodium polystyrene (KAYEXALATE) 15 GM/60ML suspension Take 120 mLs (30 g total) by mouth once.  Daily for 2 days 240 mL 0  . triamcinolone cream (KENALOG) 0.1 % Apply 1 application topically 2 (two) times daily. 30 g 0  . VENTOLIN HFA 108 (90 BASE) MCG/ACT inhaler inhale 2 puffs by mouth every 4 to 6 hours if needed  0  . HYDROcodone-acetaminophen (NORCO/VICODIN) 5-325 MG tablet Take 1 tablet by mouth every 6 (six) hours as needed. May refill on or after SEPTEMBER  28 , 2017 120 tablet 0  . cyclobenzaprine (FLEXERIL) 10 MG tablet Take 1 tablet (10 mg total) by mouth 3 (three) times daily as needed for muscle spasms. 90 tablet 3   No facility-administered medications prior to visit.     Review of Systems   Patient denies headache, fevers, malaise, unintentional weight loss, skin rash, eye pain, sinus congestion and sinus pain, sore throat, dysphagia,  hemoptysis , cough, dyspnea, wheezing, chest pain, palpitations, orthopnea, edema, abdominal pain, nausea,  melena, diarrhea, constipation, flank pain, dysuria, hematuria, urinary  Frequency, nocturia, numbness, tingling, seizures,  Focal weakness, Loss of consciousness,  Tremor, insomnia, depression, anxiety, and suicidal ideation.      Objective:  BP (!) 148/80   Pulse 65   Temp 98 F (36.7 C) (Oral)   Resp 10   Ht 4' 9.5" (1.461 m)   Wt 134 lb (60.8 kg)   SpO2 98%   BMI 28.50 kg/m   Physical Exam  General appearance: alert, cooperative and appears stated age Head: Normocephalic, without obvious abnormality, atraumatic Eyes: conjunctivae/corneas clear. PERRL, EOM's intact. Fundi benign. Ears: normal TM's and external ear canals both ears Nose: Nares normal. Septum midline. Mucosa normal. No drainage or sinus tenderness. Throat: lips, mucosa, and tongue normal; teeth and gums normal Neck: no adenopathy, no carotid bruit, no JVD, supple, symmetrical, trachea midline and thyroid not enlarged, symmetric, no tenderness/mass/nodules Lungs: clear to auscultation bilaterally Breasts: normal appearance, no masses or tenderness Heart: regular rate and rhythm, S1, S2 normal, no murmur, click, rub or gallop Abdomen: soft, non-tender; bowel sounds normal; no masses,  no organomegaly Extremities: extremities normal, atraumatic, no cyanosis or edema Pulses: 2+ and symmetric Skin: Skin color, texture, turgor normal. No rashes or lesions Neurologic: Alert and oriented X 3, normal strength and tone. Normal symmetric reflexes. Normal coordination and gait. Scoliosis , chronic noted    Assessment & Plan:   Problem List Items Addressed This Visit    Lumbar radiculitis    Managed with vicodin .  Refills  For Oct 30,  Nov 30 and Dec 30th given today after Refill history confirmed via [place] Controlled Substance databas, accessed by me today.. 3 month follow up needed.        Wilms' tumor [facility])   Routine general medical examination at a health care facility    Annual comprehensive preventive exam was  done as well as an evaluation and management of chronic conditions .  During the course of the visit the patient was educated and counseled about appropriate screening and preventive services including :  diabetes screening, lipid analysis with projected  10 year  risk for CAD , nutrition counseling, breast, cervical and colorectal cancer screening, and recommended immunizations.  Printed recommendations for health maintenance screenings was given      Hyperlipidemia LDL goal <70    Managed with atorvastatin for concurrent coronary atherosclerosis (managed by Dr. Fletcher Anon)  Lab Results  Component Value Date   CHOL 181 07/10/2016   HDL 51 07/10/2016   LDLCALC 75 07/10/2016   LDLDIRECT 102.0 02/08/2016  TRIG 274 (H) 07/10/2016   CHOLHDL 3.5 07/10/2016   Lab Results  Component Value Date   ALT 14 07/10/2016   AST 21 07/10/2016   ALKPHOS 54 07/10/2016   BILITOT 0.5 07/10/2016         Atherosclerosis of left carotid artery    She is overdue for follow up on 40-59% stenosis of left carotid , but states that this was done by Dr Fletcher Anon during recent visit.       RESOLVED: Asymptomatic carotid artery stenosis without infarction    40-59%  by Dec 2016 doppler       Hyperkalemia    Secondary to lisinopril and concurrent use of narcotics.  Lisinopril has been  Stopped and listed as an allergy.   Renal artery stenosis ruled out with renal ultrasound done by [facility] in  May 2017 (she is s/p right nephrectomy)      Screening for colon cancer    Wants to do cologuard.         Other Visit Diagnoses   None.     I have discontinued Ms. Bratcher cyclobenzaprine, HYDROcodone-acetaminophen, HYDROcodone-acetaminophen, and HYDROcodone-acetaminophen. I have also changed her HYDROcodone-acetaminophen. Additionally, I am having her maintain her cetirizine, nitroGLYCERIN, aspirin, meloxicam, ranitidine, VENTOLIN HFA, benzonatate, Phenylephrine-Acetaminophen, montelukast, albuterol, sodium polystyrene,  amLODipine, gabapentin, alendronate, methocarbamol, rosuvastatin, triamcinolone cream, pantoprazole, methylPREDNISolone, hydrOXYzine, clopidogrel, Fish Oil, and carvedilol.  Meds ordered this encounter  Medications  . DISCONTD: HYDROcodone-acetaminophen (NORCO/VICODIN) 5-325 MG tablet    Sig: Take 1 tablet by mouth every 6 (six) hours as needed. May refill on or after October 31 , 2017    Dispense:  120 tablet    Refill:  0  . DISCONTD: HYDROcodone-acetaminophen (NORCO/VICODIN) 5-325 MG tablet    Sig: Take 1 tablet by mouth every 6 (six) hours as needed. May refill on or after Nov 30 , 2017    Dispense:  120 tablet    Refill:  0  . DISCONTD: HYDROcodone-acetaminophen (NORCO/VICODIN) 5-325 MG tablet    Sig: Take 1 tablet by mouth every 6 (six) hours as needed. May refill on or after Dec 30 , 2017    Dispense:  120 tablet    Refill:  0  . HYDROcodone-acetaminophen (NORCO/VICODIN) 5-325 MG tablet    Sig: Take 1 tablet by mouth every 6 (six) hours as needed. May refill on or after oct 30 , 2017    Dispense:  120 tablet    Refill:  0    Medications Discontinued During This Encounter  Medication Reason  . cyclobenzaprine (FLEXERIL) 10 MG tablet Allergic reaction  . HYDROcodone-acetaminophen (NORCO/VICODIN) 5-325 MG tablet Reorder  . HYDROcodone-acetaminophen (NORCO/VICODIN) 5-325 MG tablet Reorder  . HYDROcodone-acetaminophen (NORCO/VICODIN) 5-325 MG tablet Reorder  . HYDROcodone-acetaminophen (NORCO/VICODIN) 5-325 MG tablet Reorder    Follow-up: Return in about 3 months (around 12/11/2016), or refill on pain medication .   [name], MD

## 2016-09-10 NOTE — Progress Notes (Signed)
Pre-visit discussion using our clinic review tool. No additional management support is needed unless otherwise documented below in the visit note.  

## 2016-09-10 NOTE — Patient Instructions (Signed)
Return for fasting labs in February,  (not due today)  You will need to be seen by me every 3 months to have your vicodin refilled by me.    Menopause is a normal process in which your reproductive ability comes to an end. This process happens gradually over a span of months to years, usually between the ages of 69 and 56. Menopause is complete when you have missed 12 consecutive menstrual periods. It is important to talk with your health care provider about some of the most common conditions that affect postmenopausal women, such as heart disease, cancer, and bone loss (osteoporosis). Adopting a healthy lifestyle and getting preventive care can help to promote your health and wellness. Those actions can also lower your chances of developing some of these common conditions. WHAT SHOULD I KNOW ABOUT MENOPAUSE? During menopause, you may experience a number of symptoms, such as:  Moderate-to-severe hot flashes.  Night sweats.  Decrease in sex drive.  Mood swings.  Headaches.  Tiredness.  Irritability.  Memory problems.  Insomnia. Choosing to treat or not to treat menopausal changes is an individual decision that you make with your health care provider. WHAT SHOULD I KNOW ABOUT HORMONE REPLACEMENT THERAPY AND SUPPLEMENTS? Hormone therapy products are effective for treating symptoms that are associated with menopause, such as hot flashes and night sweats. Hormone replacement carries certain risks, especially as you become older. If you are thinking about using estrogen or estrogen with progestin treatments, discuss the benefits and risks with your health care provider. WHAT SHOULD I KNOW ABOUT HEART DISEASE AND STROKE? Heart disease, heart attack, and stroke become more likely as you age. This may be due, in part, to the hormonal changes that your body experiences during menopause. These can affect how your body processes dietary fats, triglycerides, and cholesterol. Heart attack and  stroke are both medical emergencies. There are many things that you can do to help prevent heart disease and stroke:  Have your blood pressure checked at least every 1-2 years. High blood pressure causes heart disease and increases the risk of stroke.  If you are 74-55 years old, ask your health care provider if you should take aspirin to prevent a heart attack or a stroke.  Do not use any tobacco products, including cigarettes, chewing tobacco, or electronic cigarettes. If you need help quitting, ask your health care provider.  It is important to eat a healthy diet and maintain a healthy weight.  Be sure to include plenty of vegetables, fruits, low-fat dairy products, and lean protein.  Avoid eating foods that are high in solid fats, added sugars, or salt (sodium).  Get regular exercise. This is one of the most important things that you can do for your health.  Try to exercise for at least 150 minutes each week. The type of exercise that you do should increase your heart rate and make you sweat. This is known as moderate-intensity exercise.  Try to do strengthening exercises at least twice each week. Do these in addition to the moderate-intensity exercise.  Know your numbers.Ask your health care provider to check your cholesterol and your blood glucose. Continue to have your blood tested as directed by your health care provider. WHAT SHOULD I KNOW ABOUT CANCER SCREENING? There are several types of cancer. Take the following steps to reduce your risk and to catch any cancer development as early as possible. Breast Cancer  Practice breast self-awareness.  This means understanding how your breasts normally appear and feel.  It also means doing regular breast self-exams. Let your health care provider know about any changes, no matter how small.  If you are 69 or older, have a clinician do a breast exam (clinical breast exam or CBE) every year. Depending on your age, family history, and  medical history, it may be recommended that you also have a yearly breast X-ray (mammogram).  If you have a family history of breast cancer, talk with your health care provider about genetic screening.  If you are at high risk for breast cancer, talk with your health care provider about having an MRI and a mammogram every year.  Breast cancer (BRCA) gene test is recommended for women who have family members with BRCA-related cancers. Results of the assessment will determine the need for genetic counseling and BRCA1 and for BRCA2 testing. BRCA-related cancers include these types:  Breast. This occurs in males or females.  Ovarian.  Tubal. This may also be called fallopian tube cancer.  Cancer of the abdominal or pelvic lining (peritoneal cancer).  Prostate.  Pancreatic. Cervical, Uterine, and Ovarian Cancer Your health care provider may recommend that you be screened regularly for cancer of the pelvic organs. These include your ovaries, uterus, and vagina. This screening involves a pelvic exam, which includes checking for microscopic changes to the surface of your cervix (Pap test).  For women ages 21-65, health care providers may recommend a pelvic exam and a Pap test every three years. For women ages 75-65, they may recommend the Pap test and pelvic exam, combined with testing for human papilloma virus (HPV), every five years. Some types of HPV increase your risk of cervical cancer. Testing for HPV may also be done on women of any age who have unclear Pap test results.  Other health care providers may not recommend any screening for nonpregnant women who are considered low risk for pelvic cancer and have no symptoms. Ask your health care provider if a screening pelvic exam is right for you.  If you have had past treatment for cervical cancer or a condition that could lead to cancer, you need Pap tests and screening for cancer for at least 20 years after your treatment. If Pap tests have  been discontinued for you, your risk factors (such as having a new sexual partner) need to be reassessed to determine if you should start having screenings again. Some women have medical problems that increase the chance of getting cervical cancer. In these cases, your health care provider may recommend that you have screening and Pap tests more often.  If you have a family history of uterine cancer or ovarian cancer, talk with your health care provider about genetic screening.  If you have vaginal bleeding after reaching menopause, tell your health care provider.  There are currently no reliable tests available to screen for ovarian cancer. Lung Cancer Lung cancer screening is recommended for adults 35-18 years old who are at high risk for lung cancer because of a history of smoking. A yearly low-dose CT scan of the lungs is recommended if you:  Currently smoke.  Have a history of at least 30 pack-years of smoking and you currently smoke or have quit within the past 15 years. A pack-year is smoking an average of one pack of cigarettes per day for one year. Yearly screening should:  Continue until it has been 15 years since you quit.  Stop if you develop a health problem that would prevent you from having lung cancer treatment. Colorectal Cancer  This type of cancer can be detected and can often be prevented.  Routine colorectal cancer screening usually begins at age 64 and continues through age 49.  If you have risk factors for colon cancer, your health care provider may recommend that you be screened at an earlier age.  If you have a family history of colorectal cancer, talk with your health care provider about genetic screening.  Your health care provider may also recommend using home test kits to check for hidden blood in your stool.  A small camera at the end of a tube can be used to examine your colon directly (sigmoidoscopy or colonoscopy). This is done to check for the earliest  forms of colorectal cancer.  Direct examination of the colon should be repeated every 5-10 years until age 63. However, if early forms of precancerous polyps or small growths are found or if you have a family history or genetic risk for colorectal cancer, you may need to be screened more often. Skin Cancer  Check your skin from head to toe regularly.  Monitor any moles. Be sure to tell your health care provider:  About any new moles or changes in moles, especially if there is a change in a mole's shape or color.  If you have a mole that is larger than the size of a pencil eraser.  If any of your family members has a history of skin cancer, especially at a young age, talk with your health care provider about genetic screening.  Always use sunscreen. Apply sunscreen liberally and repeatedly throughout the day.  Whenever you are outside, protect yourself by wearing long sleeves, pants, a wide-brimmed hat, and sunglasses. WHAT SHOULD I KNOW ABOUT OSTEOPOROSIS? Osteoporosis is a condition in which bone destruction happens more quickly than new bone creation. After menopause, you may be at an increased risk for osteoporosis. To help prevent osteoporosis or the bone fractures that can happen because of osteoporosis, the following is recommended:  If you are 15-66 years old, get at least 1,000 mg of calcium and at least 600 mg of vitamin D per day.  If you are older than age 74 but younger than age 61, get at least 1,200 mg of calcium and at least 600 mg of vitamin D per day.  If you are older than age 5, get at least 1,200 mg of calcium and at least 800 mg of vitamin D per day. Smoking and excessive alcohol intake increase the risk of osteoporosis. Eat foods that are rich in calcium and vitamin D, and do weight-bearing exercises several times each week as directed by your health care provider. WHAT SHOULD I KNOW ABOUT HOW MENOPAUSE AFFECTS [place]? Depression may occur at any age, but  it is more common as you become older. Common symptoms of depression include:  Low or sad mood.  Changes in sleep patterns.  Changes in appetite or eating patterns.  Feeling an overall lack of motivation or enjoyment of activities that you previously enjoyed.  Frequent crying spells. Talk with your health care provider if you think that you are experiencing depression. WHAT SHOULD I KNOW ABOUT IMMUNIZATIONS? It is important that you get and maintain your immunizations. These include:  Tetanus, diphtheria, and pertussis (Tdap) booster vaccine.  Influenza every year before the flu season begins.  Pneumonia vaccine.  Shingles vaccine. Your health care provider may also recommend other immunizations.   This information is not intended to replace advice given to you by your health care  provider. Make sure you discuss any questions you have with your health care provider.   Document Released: 12/20/2005 Document Revised: 11/18/2014 Document Reviewed: 06/30/2014 Elsevier Interactive Patient Education 2016 [employer]  

## 2016-09-12 ENCOUNTER — Encounter: Payer: Self-pay | Admitting: Internal Medicine

## 2016-09-12 NOTE — Assessment & Plan Note (Signed)
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

## 2016-09-12 NOTE — Assessment & Plan Note (Signed)
40-59%  by Dec 2016 doppler

## 2016-09-12 NOTE — Assessment & Plan Note (Signed)
Managed with atorvastatin for concurrent coronary atherosclerosis (managed by Dr. Fletcher Anon)  Lab Results  Component Value Date   CHOL 181 07/10/2016   HDL 51 07/10/2016   LDLCALC 75 07/10/2016   LDLDIRECT 102.0 02/08/2016   TRIG 274 (H) 07/10/2016   CHOLHDL 3.5 07/10/2016   Lab Results  Component Value Date   ALT 14 07/10/2016   AST 21 07/10/2016   ALKPHOS 54 07/10/2016   BILITOT 0.5 07/10/2016

## 2016-09-12 NOTE — Assessment & Plan Note (Signed)
Annual comprehensive preventive exam was done as well as an evaluation and management of chronic conditions .  During the course of the visit the patient was educated and counseled about appropriate screening and preventive services including :  diabetes screening, lipid analysis with projected  10 year  risk for CAD , nutrition counseling, breast, cervical and colorectal cancer screening, and recommended immunizations.  Printed recommendations for health maintenance screenings was given 

## 2016-09-17 NOTE — Progress Notes (Signed)
cologuard ordered for patient and spouse.

## 2016-09-18 ENCOUNTER — Other Ambulatory Visit: Payer: Self-pay | Admitting: Cardiovascular Disease

## 2016-09-24 ENCOUNTER — Other Ambulatory Visit: Payer: Self-pay

## 2016-09-24 NOTE — Telephone Encounter (Signed)
Spoke with patient regarding a refill on Protonix and apparently the patient has already been contacted that the pharmacy has it ready.

## 2016-10-01 ENCOUNTER — Telehealth: Payer: Self-pay | Admitting: *Deleted

## 2016-10-01 NOTE — Telephone Encounter (Signed)
Pt requested a prior authorization for methocarbamol Pharmacy  [facility]

## 2016-10-02 ENCOUNTER — Other Ambulatory Visit: Payer: Self-pay | Admitting: Internal Medicine

## 2016-10-10 DIAGNOSIS — J449 Chronic obstructive pulmonary disease, unspecified: Secondary | ICD-10-CM | POA: Diagnosis not present

## 2016-10-10 DIAGNOSIS — Z1211 Encounter for screening for malignant neoplasm of colon: Secondary | ICD-10-CM | POA: Diagnosis not present

## 2016-10-10 DIAGNOSIS — Z1212 Encounter for screening for malignant neoplasm of rectum: Secondary | ICD-10-CM | POA: Diagnosis not present

## 2016-10-11 NOTE — Telephone Encounter (Signed)
Completed on Cover my meds.

## 2016-10-14 ENCOUNTER — Telehealth: Payer: Self-pay | Admitting: Internal Medicine

## 2016-10-14 MED ORDER — METHOCARBAMOL 750 MG PO TABS: 750.0000 mg | ORAL_TABLET | Freq: Four times a day (QID) | ORAL | 1 refills | Status: DC

## 2016-10-14 NOTE — Telephone Encounter (Signed)
Refill sent as requested. 

## 2016-10-14 NOTE — Telephone Encounter (Signed)
Pt called about medication needing to be refilled and authorized for methocarbamol (ROBAXIN) 750 MG tablet  Pharmacy is [place], [place]  Call pt @ [phone]. Thank you!

## 2016-10-15 NOTE — Telephone Encounter (Signed)
It was denied per the insurance.  Denied as it is a high risk medication and not used for a FDA rationale.

## 2016-10-16 ENCOUNTER — Telehealth: Payer: Self-pay | Admitting: *Deleted

## 2016-10-16 NOTE — Telephone Encounter (Signed)
Left message to call on cell phone

## 2016-10-16 NOTE — Telephone Encounter (Signed)
Please let patient know this was denied by the insurance.

## 2016-10-16 NOTE — Telephone Encounter (Signed)
Patient requested a prior authorization for methocarbamol Pharmacy [facility] on [address]  Pt contact [phone]

## 2016-10-16 NOTE — Telephone Encounter (Signed)
Patient advised of below and verbalized understanding.  She will contact pharmacy.

## 2016-10-22 LAB — COLOGUARD: COLOGUARD: NEGATIVE

## 2016-10-23 ENCOUNTER — Encounter: Payer: Self-pay | Admitting: Internal Medicine

## 2016-10-23 ENCOUNTER — Ambulatory Visit: Payer: Self-pay

## 2016-10-25 ENCOUNTER — Telehealth: Payer: Self-pay | Admitting: Internal Medicine

## 2016-10-25 NOTE — Telephone Encounter (Signed)
Patient notified and voiced understanding.

## 2016-10-25 NOTE — Telephone Encounter (Signed)
The results of patient's cologuard is negative.   We will repeat every 3 years For colon CA screening. Please notify patient

## 2016-11-09 DIAGNOSIS — J449 Chronic obstructive pulmonary disease, unspecified: Secondary | ICD-10-CM | POA: Diagnosis not present

## 2016-11-12 ENCOUNTER — Ambulatory Visit: Payer: Self-pay

## 2016-12-09 ENCOUNTER — Telehealth: Payer: Self-pay | Admitting: Internal Medicine

## 2016-12-09 NOTE — Telephone Encounter (Signed)
Pt called requesting refills on gabapentin (NEURONTIN) 300 MG capsule and ranitidine (ZANTAC) 150 MG tablet and HYDROcodone-acetaminophen (NORCO/VICODIN) 5-325 MG tablet. Please advise, thank you!  Pharmacy - [facility] on [address]  Call pt @ [phone]

## 2016-12-10 DIAGNOSIS — J449 Chronic obstructive pulmonary disease, unspecified: Secondary | ICD-10-CM | POA: Diagnosis not present

## 2016-12-10 MED ORDER — GABAPENTIN 300 MG PO CAPS: 300.0000 mg | ORAL_CAPSULE | Freq: Two times a day (BID) | ORAL | 1 refills | Status: DC

## 2016-12-10 MED ORDER — RANITIDINE HCL 150 MG PO TABS: ORAL_TABLET | ORAL | 3 refills | Status: DC

## 2016-12-10 NOTE — Telephone Encounter (Signed)
Denied.  Has to be seen every 3 months for narcotics refills. Please call patient and arrange an OV

## 2016-12-10 NOTE — Telephone Encounter (Signed)
Gabapentin and Zantac sent to pharmacy

## 2016-12-10 NOTE — Telephone Encounter (Signed)
Rx request Hydrocodone-Acetaminophen 5-325 Last OV 09/10/2016 Last refilled 09/10/2016 Next appt: no new appt. Please advise

## 2016-12-10 NOTE — Telephone Encounter (Signed)
Pt is requesting to have the Zantac and gabapentin called into the pharmacy prior to her appt on Friday  Pharmacy [facility]

## 2016-12-13 ENCOUNTER — Encounter: Payer: Self-pay | Admitting: Internal Medicine

## 2016-12-13 ENCOUNTER — Ambulatory Visit (INDEPENDENT_AMBULATORY_CARE_PROVIDER_SITE_OTHER): Payer: PPO | Admitting: Internal Medicine

## 2016-12-13 VITALS — BP 202/98 | HR 68 | Temp 97.4°F | Ht <= 58 in | Wt 144.2 lb

## 2016-12-13 DIAGNOSIS — I10 Essential (primary) hypertension: Secondary | ICD-10-CM | POA: Diagnosis not present

## 2016-12-13 DIAGNOSIS — E785 Hyperlipidemia, unspecified: Secondary | ICD-10-CM

## 2016-12-13 DIAGNOSIS — Z905 Acquired absence of kidney: Secondary | ICD-10-CM | POA: Diagnosis not present

## 2016-12-13 DIAGNOSIS — Z85528 Personal history of other malignant neoplasm of kidney: Secondary | ICD-10-CM

## 2016-12-13 DIAGNOSIS — M5416 Radiculopathy, lumbar region: Secondary | ICD-10-CM | POA: Diagnosis not present

## 2016-12-13 DIAGNOSIS — M62830 Muscle spasm of back: Secondary | ICD-10-CM

## 2016-12-13 DIAGNOSIS — I70209 Unspecified atherosclerosis of native arteries of extremities, unspecified extremity: Secondary | ICD-10-CM

## 2016-12-13 LAB — COMPREHENSIVE METABOLIC PANEL
ALBUMIN: 4.3 g/dL (ref 3.5–5.2)
ALK PHOS: 49 U/L (ref 39–117)
ALT: 19 U/L (ref 0–35)
AST: 22 U/L (ref 0–37)
BILIRUBIN TOTAL: 0.8 mg/dL (ref 0.2–1.2)
BUN: 18 mg/dL (ref 6–23)
CALCIUM: 9.2 mg/dL (ref 8.4–10.5)
CO2: 27 mEq/L (ref 19–32)
CREATININE: 1.03 mg/dL (ref 0.40–1.20)
Chloride: 103 mEq/L (ref 96–112)
GFR: 57.1 mL/min — ABNORMAL LOW (ref >60.00)
Glucose, Bld: 95 mg/dL (ref 70–99)
Potassium: 4.8 mEq/L (ref 3.5–5.1)
SODIUM: 133 meq/L — AB (ref 135–145)
TOTAL PROTEIN: 7.4 g/dL (ref 6.0–8.3)

## 2016-12-13 LAB — MICROALBUMIN / CREATININE URINE RATIO
CREATININE, U: 14.5 mg/dL
Microalb Creat Ratio: 4.8 mg/g (ref 0.0–30.0)

## 2016-12-13 MED ORDER — AMLODIPINE BESYLATE 10 MG PO TABS: 10.0000 mg | ORAL_TABLET | Freq: Every day | ORAL | 0 refills | Status: DC

## 2016-12-13 MED ORDER — HYDROCODONE-ACETAMINOPHEN 5-325 MG PO TABS: 1.0000 | ORAL_TABLET | Freq: Four times a day (QID) | ORAL | 0 refills | Status: DC | PRN

## 2016-12-13 MED ORDER — VENTOLIN HFA 108 (90 BASE) MCG/ACT IN AERS: INHALATION_SPRAY | RESPIRATORY_TRACT | 3 refills | Status: DC

## 2016-12-13 NOTE — Progress Notes (Addendum)
Subjective:  Patient ID: Suzanne Cole, female    DOB: Aug 17, 1951  Age: 66 y.o. MRN: [id number]  CC: The primary encounter diagnosis was Essential hypertension. Diagnoses of History of Wilms' tumor, S/p nephrectomy, Lumbar radiculitis, Atherosclerotic peripheral vascular disease ([place]), Hyperlipidemia LDL goal <70, and Muscle spasm of back were also pertinent to this visit.  HPI Suzanne Cole presents for follow up on hypertension and chronic pain secondary to severe scoliosis and degenerative changes involving cervical , thoracic and lumbar spine   Her pain is managed with daily use of opioids and Methocarbamol , which manages the muscle spasms that occur because of her scoliosis.  Recent refills have been denied by her insurer,  [employer] due to her age of 50. Prior authorization requires a letter from her physician. .  Prior trials of alternative muscle reaxers resulted  in adverse effects incuding tongue swelling that occurred with flexeril . She has been paying out of pocket for the robaxin.   Has been using good rx to pay out of pokcet for #120 tablets are costing  $50  ,  She takes it four times daily . without it she has muscle spasm  In back and legs.   Weight gain of 10 lbs .  Doesn't feel that she overeats.  Has not been exercising due to the weather,  The cold aggravates her back pain .  Has cut out soda, but still eats ice  Cream bars every night    Fighting a cold  No fevers, body aches, headache or sore throat . Has been using a decongestant.  Hypertension: patient checks blood pressure twice weekly at home.  Readings have been for the most part > 140/80 at rest . Patient is following a reduced salt diet most days and is taking medications as prescribed    Outpatient Medications Prior to Visit  Medication Sig Dispense Refill  . albuterol (PROVENTIL) (2.5 MG/3ML) 0.083% nebulizer solution Take 3 mLs (2.5 mg total) by nebulization every 6 (six) hours as needed for  wheezing or shortness of breath. 150 mL 1  . alendronate (FOSAMAX) 70 MG tablet TAKE ONE TABLET BY MOUTH ONCE A WEEK IN THE MORNING WITH A FULL GLASS OF WATER, 30 MINUTES BEFORE A MEAL OR BEVERAGE. REMAIN UPRIGHT 4 tablet 0  . aspirin 81 MG tablet Take 81 mg by mouth as needed for pain.    . benzonatate (TESSALON) 200 MG capsule take 1 capsule by mouth three times a day if needed  0  . carvedilol (COREG) 12.5 MG tablet Take 1 tablet (12.5 mg total) by mouth 2 (two) times daily. 60 tablet 5  . cetirizine (ZYRTEC) 10 MG tablet Take 10 mg by mouth daily.    . clopidogrel (PLAVIX) 75 MG tablet TAKE ONE TABLET BY MOUTH ONCE DAILY 90 tablet 3  . gabapentin (NEURONTIN) 300 MG capsule Take 1 capsule (300 mg total) by mouth 2 (two) times daily. 180 capsule 1  . hydrOXYzine (ATARAX/VISTARIL) 25 MG tablet Take 1 tablet (25 mg total) by mouth 3 (three) times daily as needed. 15 tablet 0  . meloxicam (MOBIC) 15 MG tablet TAKE 1 TABLET EVERY DAY 90 tablet 6  . methocarbamol (ROBAXIN) 750 MG tablet Take 1 tablet (750 mg total) by mouth 4 (four) times daily. 360 tablet 1  . methylPREDNISolone (MEDROL DOSEPAK) 4 MG TBPK tablet Take Tapered dose as directed 21 tablet 0  . montelukast (SINGULAIR) 10 MG tablet Take 1 tablet (10 mg total) by  mouth at bedtime. For seasonal allergies 30 tablet 5  . nitroGLYCERIN (NITROSTAT) 0.3 MG SL tablet Place 1 tablet (0.3 mg total) under the tongue every 5 (five) minutes as needed for chest pain. 30 tablet 0  . Omega-3 Fatty Acids (FISH OIL) 1000 MG CAPS Take 3 capsules (3,000 mg total) by mouth daily. 90 capsule 0  . pantoprazole (PROTONIX) 40 MG tablet TAKE ONE TABLET BY MOUTH ONCE DAILY 30 tablet 3  . Phenylephrine-Acetaminophen 5-325 MG TABS as needed.     . ranitidine (ZANTAC) 150 MG tablet TAKE 1 TABLET(150 MG) BY MOUTH TWICE DAILY 60 tablet 3  . rosuvastatin (CRESTOR) 40 MG tablet Take 1 tablet (40 mg total) by mouth daily. 30 tablet 3  . sodium polystyrene (KAYEXALATE) 15  GM/60ML suspension Take 120 mLs (30 g total) by mouth once. Daily for 2 days 240 mL 0  . triamcinolone cream (KENALOG) 0.1 % Apply 1 application topically 2 (two) times daily. 30 g 0  . amLODipine (NORVASC) 5 MG tablet Take 1 tablet (5 mg total) by mouth daily. For blood pressure 90 tablet 1  . HYDROcodone-acetaminophen (NORCO/VICODIN) 5-325 MG tablet Take 1 tablet by mouth every 6 (six) hours as needed. May refill on or after oct 30 , 2017 120 tablet 0  . VENTOLIN HFA 108 (90 BASE) MCG/ACT inhaler inhale 2 puffs by mouth every 4 to 6 hours if needed  0   No facility-administered medications prior to visit.     Review of Systems;  Patient denies headache, fevers, malaise, unintentional weight loss, skin rash, eye pain, sinus congestion and sinus pain, sore throat, dysphagia,  hemoptysis , cough, dyspnea, wheezing, chest pain, palpitations, orthopnea, edema, abdominal pain, nausea, melena, diarrhea, constipation, flank pain, dysuria, hematuria, urinary  Frequency, nocturia, numbness, tingling, seizures,  Focal weakness, Loss of consciousness,  Tremor, insomnia, depression, anxiety, and suicidal ideation.      Objective:  BP (!) 202/98   Pulse 68   Temp 97.4 F (36.3 C) (Oral)   Ht 4\' 10"  (1.473 m)   Wt 144 lb 3.2 oz (65.4 kg)   SpO2 99%   BMI 30.14 kg/m   BP Readings from Last 3 Encounters:  12/13/16 (!) 202/98  09/10/16 (!) 148/80  08/22/16 140/70    Wt Readings from Last 3 Encounters:  12/13/16 144 lb 3.2 oz (65.4 kg)  09/10/16 134 lb (60.8 kg)  08/22/16 131 lb 12 oz (59.8 kg)    General appearance: alert, cooperative and appears stated age Ears: normal TM's and external ear canals both ears Throat: lips, mucosa, and tongue normal; teeth and gums normal Neck: no adenopathy, no carotid bruit, supple, symmetrical, trachea midline and thyroid not enlarged, symmetric, no tenderness/mass/nodules Back: symmetric, no curvature. ROM normal. No CVA tenderness. Lungs: clear to  auscultation bilaterally Heart: regular rate and rhythm, S1, S2 normal, no murmur, click, rub or gallop Abdomen: soft, non-tender; bowel sounds normal; no masses,  no organomegaly Pulses: 2+ and symmetric Skin: Skin color, texture, turgor normal. No rashes or lesions Lymph nodes: Cervical, supraclavicular, and axillary nodes normal.  Lab Results  Component Value Date   HGBA1C 5.7 11/09/2014    Lab Results  Component Value Date   CREATININE 1.03 12/13/2016   CREATININE 1.26 (H) 05/30/2016   CREATININE 1.40 (H) 02/09/2016    Lab Results  Component Value Date   WBC 6.8 08/03/2015   HGB 12.9 08/03/2015   HCT 38.3 08/03/2015   PLT 277.0 08/03/2015   GLUCOSE 95 12/13/2016  CHOL 181 07/10/2016   TRIG 274 (H) 07/10/2016   HDL 51 07/10/2016   LDLDIRECT 102.0 02/08/2016   LDLCALC 75 07/10/2016   ALT 19 12/13/2016   AST 22 12/13/2016   NA 133 (L) 12/13/2016   K 4.8 12/13/2016   CL 103 12/13/2016   CREATININE 1.03 12/13/2016   BUN 18 12/13/2016   CO2 27 12/13/2016   TSH 1.29 08/03/2015   INR 0.9 11/28/2014   HGBA1C 5.7 11/09/2014   MICROALBUR <0.7 12/13/2016    Dg Neck Soft Tissue  Result Date: 06/07/2016 CLINICAL DATA:  Difficulty swallowing earlier today. Extensive poison ivy exposure. EXAM: NECK SOFT TISSUES - 1+ VIEW COMPARISON:  Cervical spine radiographs 11/25/2014 FINDINGS: The soft tissues of the neck are unremarkable. The airway is patent. The upper lungs are clear. Advanced degenerative changes are again noted in the lower cervical spine. There is some progression since prior study. Additional teeth have been extracted since the prior exam. No focal lytic or blastic lesions are present. IMPRESSION: Normal appearance of the soft tissues of the neck. Progressive spondylosis in the cervical spine. Electronically Signed   By: San Morelle M.D.   On: 06/07/2016 18:35   Assessment & Plan:   Problem List Items Addressed This Visit    Atherosclerotic peripheral  vascular disease ([place])    <40% on right,  < 60% on left by Jan 2016 carotid dopplers.  She is taking aspirin and crestor daily.       Relevant Medications   amLODipine (NORVASC) 10 MG tablet   cloNIDine (CATAPRES) tablet 0.2 mg   History of Wilms' tumor    S/p radical right nephrectomy which also took a significant amount of surrounding muscle, which led to her development of scoliosis       Hyperlipidemia LDL goal <70    Managed with crestor high dose for known CAD and PAD.  Lab Results  Component Value Date   CHOL 181 07/10/2016   HDL 51 07/10/2016   LDLCALC 75 07/10/2016   LDLDIRECT 102.0 02/08/2016   TRIG 274 (H) 07/10/2016   CHOLHDL 3.5 07/10/2016   Lab Results  Component Value Date   ALT 19 12/13/2016   AST 22 12/13/2016   ALKPHOS 49 12/13/2016   BILITOT 0.8 12/13/2016         Relevant Medications   amLODipine (NORVASC) 10 MG tablet   cloNIDine (CATAPRES) tablet 0.2 mg   Hypertension - Primary    Uncontrolled  Today, likely due to use of OTC cold medications.  clonidine 0. 1 mg given to patient (first dose was dropped on the floor by patient) and patient was advised to take an additional 5 mg of amlodipine tonight for sbp>160.  Daily  Dose changed to 10 mg       Relevant Medications   amLODipine (NORVASC) 10 MG tablet   cloNIDine (CATAPRES) tablet 0.2 mg   Other Relevant Orders   Comprehensive metabolic panel (Completed)   Microalbumin / creatinine urine ratio (Completed)   Lumbar radiculitis    Secondary to DDD ans scoliosis Managed with vicodin and methocarbamol  .  Refills  For Feb, March and April given today after review of Refill history was attempted via [place] Controlled Substance database.. 3 month follow up needed.        Muscle spasm of back    Secondary to scoliosis and DDD of thoracolumbar spine  Managed with methocarbamol .  She is aware of the potential side effects and adverse events with continued  use and is requesting an appeal of her insurance's  denial of coverage .        S/p nephrectomy      I have changed Suzanne Cole amLODipine and HYDROcodone-acetaminophen. I am also having her maintain her cetirizine, nitroGLYCERIN, aspirin, meloxicam, benzonatate, Phenylephrine-Acetaminophen, montelukast, albuterol, sodium polystyrene, rosuvastatin, triamcinolone cream, methylPREDNISolone, hydrOXYzine, clopidogrel, Fish Oil, carvedilol, pantoprazole, alendronate, methocarbamol, gabapentin, ranitidine, VENTOLIN HFA, and HYDROcodone-acetaminophen. We will continue to administer cloNIDine.  Meds ordered this encounter  Medications  . VENTOLIN HFA 108 (90 Base) MCG/ACT inhaler    Sig: inhale 2 puffs by mouth every 4 to 6 hours if needed    Dispense:  6.7 g    Refill:  3  . DISCONTD: HYDROcodone-acetaminophen (NORCO/VICODIN) 5-325 MG tablet    Sig: Take 1 tablet by mouth every 6 (six) hours as needed.    Dispense:  120 tablet    Refill:  0  . DISCONTD: HYDROcodone-acetaminophen (NORCO/VICODIN) 5-325 MG tablet    Sig: Take 1 tablet by mouth every 6 (six) hours as needed. May refill on or after March 10 2017    Dispense:  120 tablet    Refill:  0  . DISCONTD: HYDROcodone-acetaminophen (NORCO/VICODIN) 5-325 MG tablet    Sig: Take 1 tablet by mouth every 6 (six) hours as needed. May refill on or after January 11 2017    Dispense:  120 tablet    Refill:  0  . amLODipine (NORVASC) 10 MG tablet    Sig: Take 1 tablet (10 mg total) by mouth daily. For blood pressure    Dispense:  90 tablet    Refill:  0  . HYDROcodone-acetaminophen (NORCO/VICODIN) 5-325 MG tablet    Sig: Take 1 tablet by mouth every 6 (six) hours as needed. May refill on or after January 11 2017    Dispense:  120 tablet    Refill:  0  . HYDROcodone-acetaminophen (NORCO/VICODIN) 5-325 MG tablet    Sig: Take 1 tablet by mouth every 6 (six) hours as needed. May refill on or after February 11 2017    Dispense:  120 tablet    Refill:  0  . cloNIDine (CATAPRES) tablet 0.2 mg    Medications  Discontinued During This Encounter  Medication Reason  . VENTOLIN HFA 108 (90 BASE) MCG/ACT inhaler Reorder  . HYDROcodone-acetaminophen (NORCO/VICODIN) 5-325 MG tablet Reorder  . HYDROcodone-acetaminophen (NORCO/VICODIN) 5-325 MG tablet Reorder  . amLODipine (NORVASC) 5 MG tablet Reorder  . HYDROcodone-acetaminophen (NORCO/VICODIN) 5-325 MG tablet Reorder  . HYDROcodone-acetaminophen (NORCO/VICODIN) 5-325 MG tablet Reorder    Follow-up: Return in about 2 weeks (around 12/27/2016), or RN VISIT FOR BP CHECK , for 3 MONTHS WILL Maralyn Witherell  FOR REFILLS .   [name], MD

## 2016-12-13 NOTE — Progress Notes (Signed)
Pre visit review using our clinic review tool, if applicable. No additional management support is needed unless otherwise documented below in the visit note. 

## 2016-12-13 NOTE — Patient Instructions (Addendum)
yOUR BLOOD PRESSURE IS VERY ELEVATED.  YOU WERE GIVEN CLONIDINE 0.1 MG TODAY IN THE OFFICE TO LOWER IT GRADUALLY  IF YOUR HOME READING TONIGHT IS > 160,  TAKE 5 MG AMLODIPINE   STARTING TOMOROWW,  Increase your amlodipine to 10 mg daily.  PLEASE  return in 2 weeks WITH YOUR BLOOD PRESSURE CUFF

## 2016-12-15 ENCOUNTER — Encounter: Payer: Self-pay | Admitting: Internal Medicine

## 2016-12-15 DIAGNOSIS — Z905 Acquired absence of kidney: Secondary | ICD-10-CM | POA: Insufficient documentation

## 2016-12-15 DIAGNOSIS — M62830 Muscle spasm of back: Secondary | ICD-10-CM | POA: Insufficient documentation

## 2016-12-15 MED ORDER — CLONIDINE HCL 0.1 MG PO TABS: 0.2000 mg | ORAL_TABLET | Freq: Once | ORAL | Status: DC

## 2016-12-15 NOTE — Assessment & Plan Note (Signed)
Secondary to DDD ans scoliosis Managed with vicodin and methocarbamol  .  Refills  For Feb, March and April given today after review of Refill history was attempted via [place] Controlled Substance database.. 3 month follow up needed.

## 2016-12-15 NOTE — Assessment & Plan Note (Signed)
S/p radical right nephrectomy which also took a significant amount of surrounding muscle, which led to her development of scoliosis

## 2016-12-15 NOTE — Assessment & Plan Note (Signed)
Managed with crestor high dose for known CAD and PAD.  Lab Results  Component Value Date   CHOL 181 07/10/2016   HDL 51 07/10/2016   LDLCALC 75 07/10/2016   LDLDIRECT 102.0 02/08/2016   TRIG 274 (H) 07/10/2016   CHOLHDL 3.5 07/10/2016   Lab Results  Component Value Date   ALT 19 12/13/2016   AST 22 12/13/2016   ALKPHOS 49 12/13/2016   BILITOT 0.8 12/13/2016

## 2016-12-15 NOTE — Assessment & Plan Note (Signed)
Uncontrolled  Today, likely due to use of OTC cold medications.  clonidine 0. 1 mg given to patient (first dose was dropped on the floor by patient) and patient was advised to take an additional 5 mg of amlodipine tonight for sbp>160.  Daily  Dose changed to 10 mg

## 2016-12-15 NOTE — Assessment & Plan Note (Signed)
Secondary to scoliosis and DDD of thoracolumbar spine  Managed with methocarbamol .  She is aware of the potential side effects and adverse events with continued use and is requesting an appeal of her insurance's denial of coverage .

## 2016-12-15 NOTE — Assessment & Plan Note (Addendum)
<  40% on right,  < 60% on left by Jan 2016 carotid dopplers.  She is taking aspirin and crestor daily.

## 2016-12-16 ENCOUNTER — Encounter: Payer: Self-pay | Admitting: *Deleted

## 2016-12-25 ENCOUNTER — Telehealth: Payer: Self-pay | Admitting: *Deleted

## 2016-12-25 MED ORDER — ALBUTEROL SULFATE HFA 108 (90 BASE) MCG/ACT IN AERS: 2.0000 | INHALATION_SPRAY | Freq: Four times a day (QID) | RESPIRATORY_TRACT | 0 refills | Status: DC | PRN

## 2016-12-25 NOTE — Telephone Encounter (Signed)
Pharmacy rec erx for Proair.

## 2016-12-25 NOTE — Telephone Encounter (Signed)
PROAIR SENT. IT DEFAULTS TO THE SAME RX IN EP[facility]

## 2016-12-25 NOTE — Telephone Encounter (Signed)
Pharmacy is requesting new Rx for Proair. They are unable to change ventolin to Proair due to DAW on Ventolin. Please advise.

## 2017-01-01 ENCOUNTER — Ambulatory Visit: Payer: PPO

## 2017-01-01 VITALS — BP 122/68 | HR 68 | Resp 16

## 2017-01-01 DIAGNOSIS — I10 Essential (primary) hypertension: Secondary | ICD-10-CM

## 2017-01-01 NOTE — Progress Notes (Signed)
Patient comes in for blood pressure check.  She reports taking her medication as prescribed.   I checked  her blood pressure on her left arm with her home electronic  blood pressure cuff it was  152/97 pulse 70.  Also took blood pressure with our electronic blood pressure machine and blood pressure was on left arm was 133/78.   Patient brings in the following blood pressure readings  2/4 128/78 p-70 3:30pm 2/8 142/87 p-69 (;00am         137/78 p-67 10am 2/9 152/81 p-77 10:15am 2/11129/74 p-78 8:15pm 2/12 132/78 p-79 8:30pm 2/16 185/84 p-73 1:14pm 2/18 144/67 p-84 9:00am          131/69 p7810:00am 2/19 117/66 p 72 11:15am 118/57 p-72 12:00pm 2/21 146/77 p-73 7:00am 134/86 p-65 8:00am Please advise.

## 2017-01-05 NOTE — Assessment & Plan Note (Signed)
Home meter is overestimating her systolic bp by 20 pts  based on in house calibration.  Continue current medications.

## 2017-01-05 NOTE — Progress Notes (Signed)
Home meter is overestimating her systolic bp by 20 pts  based on in house calibration.  Continue current medications.

## 2017-01-08 DIAGNOSIS — J449 Chronic obstructive pulmonary disease, unspecified: Secondary | ICD-10-CM | POA: Diagnosis not present

## 2017-01-09 NOTE — Progress Notes (Signed)
Patient advised of below .  She is going to pick up new blood pressure monitor since her blood pressure monitor is consistent with our readings.

## 2017-01-22 ENCOUNTER — Telehealth: Payer: Self-pay | Admitting: Cardiovascular Disease

## 2017-01-22 ENCOUNTER — Other Ambulatory Visit: Payer: Self-pay

## 2017-01-22 MED ORDER — PANTOPRAZOLE SODIUM 40 MG PO TBEC: 40.0000 mg | DELAYED_RELEASE_TABLET | Freq: Every day | ORAL | 3 refills | Status: DC

## 2017-01-22 NOTE — Telephone Encounter (Signed)
Refill sent.

## 2017-01-22 NOTE — Telephone Encounter (Signed)
°*  STAT* If patient is at the pharmacy, call can be transferred to refill team.   1. Which medications need to be refilled? (please list name of each medication and dose if known)  pantoprazole   2. Which pharmacy/location (including street and city if local pharmacy) is medication to be sent to? [facility] on [address]   3. Do they need a 30 day or 90 day supply? 90 day

## 2017-01-22 NOTE — Telephone Encounter (Deleted)
Requested Prescriptions   Signed Prescriptions Disp Refills  . pantoprazole (PROTONIX) 40 MG tablet 90 tablet 3    Sig: Take 1 tablet (40 mg total) by mouth daily.    Authorizing Provider: Kathlyn [place] A    Ordering User: Janan Ridge

## 2017-01-23 ENCOUNTER — Telehealth: Payer: Self-pay | Admitting: Internal Medicine

## 2017-01-23 NOTE — Telephone Encounter (Signed)
Left pt message asking to call Allison back directly at [phone] to schedule AWV. Thanks! °

## 2017-01-23 NOTE — Telephone Encounter (Signed)
Pt called and stated that she did not wish to have this done.

## 2017-01-27 ENCOUNTER — Encounter: Payer: Self-pay | Admitting: Internal Medicine

## 2017-01-27 ENCOUNTER — Other Ambulatory Visit: Payer: Self-pay | Admitting: Internal Medicine

## 2017-01-27 NOTE — Telephone Encounter (Signed)
Refilled: 10/02/16 Last OV: 12/13/16 Last Labs: 12/13/16 Future OV: 03/14/17 Please advise?

## 2017-01-28 MED ORDER — MELOXICAM 15 MG PO TABS: 15.0000 mg | ORAL_TABLET | Freq: Every day | ORAL | 1 refills | Status: DC

## 2017-01-28 MED ORDER — ALENDRONATE SODIUM 70 MG PO TABS: 70.0000 mg | ORAL_TABLET | ORAL | 3 refills | Status: DC

## 2017-01-28 NOTE — Telephone Encounter (Signed)
Ok to refill,  Refill sent  

## 2017-01-29 ENCOUNTER — Other Ambulatory Visit: Payer: Self-pay

## 2017-01-29 MED ORDER — ROSUVASTATIN CALCIUM 40 MG PO TABS
40.0000 mg | ORAL_TABLET | Freq: Every day | ORAL | 3 refills | Status: DC
Start: 2017-01-29 — End: 2017-08-08

## 2017-02-05 DIAGNOSIS — J449 Chronic obstructive pulmonary disease, unspecified: Secondary | ICD-10-CM | POA: Diagnosis not present

## 2017-03-10 ENCOUNTER — Ambulatory Visit: Payer: Self-pay | Admitting: Cardiovascular Disease

## 2017-03-14 ENCOUNTER — Ambulatory Visit (INDEPENDENT_AMBULATORY_CARE_PROVIDER_SITE_OTHER): Payer: PPO | Admitting: Internal Medicine

## 2017-03-14 ENCOUNTER — Encounter: Payer: Self-pay | Admitting: Internal Medicine

## 2017-03-14 VITALS — BP 136/80 | HR 63 | Temp 97.9°F | Resp 15 | Ht <= 58 in | Wt 143.4 lb

## 2017-03-14 DIAGNOSIS — M5416 Radiculopathy, lumbar region: Secondary | ICD-10-CM | POA: Diagnosis not present

## 2017-03-14 DIAGNOSIS — E2839 Other primary ovarian failure: Secondary | ICD-10-CM

## 2017-03-14 DIAGNOSIS — I2510 Atherosclerotic heart disease of native coronary artery without angina pectoris: Secondary | ICD-10-CM | POA: Diagnosis not present

## 2017-03-14 DIAGNOSIS — M62830 Muscle spasm of back: Secondary | ICD-10-CM | POA: Diagnosis not present

## 2017-03-14 DIAGNOSIS — Z1231 Encounter for screening mammogram for malignant neoplasm of breast: Secondary | ICD-10-CM

## 2017-03-14 DIAGNOSIS — R7303 Prediabetes: Secondary | ICD-10-CM | POA: Diagnosis not present

## 2017-03-14 DIAGNOSIS — I10 Essential (primary) hypertension: Secondary | ICD-10-CM | POA: Diagnosis not present

## 2017-03-14 DIAGNOSIS — E785 Hyperlipidemia, unspecified: Secondary | ICD-10-CM

## 2017-03-14 DIAGNOSIS — Z1239 Encounter for other screening for malignant neoplasm of breast: Secondary | ICD-10-CM

## 2017-03-14 LAB — LIPID PANEL
Cholesterol: 128 mg/dL (ref 0–200)
HDL: 48.6 mg/dL (ref >39.00)
NONHDL: 79.63
Total CHOL/HDL Ratio: 3
Triglycerides: 241 mg/dL — ABNORMAL HIGH (ref 0.0–149.0)
VLDL: 48.2 mg/dL — AB (ref 0.0–40.0)

## 2017-03-14 LAB — COMPREHENSIVE METABOLIC PANEL
ALT: 14 U/L (ref 0–35)
AST: 19 U/L (ref 0–37)
Albumin: 4.6 g/dL (ref 3.5–5.2)
Alkaline Phosphatase: 44 U/L (ref 39–117)
BUN: 19 mg/dL (ref 6–23)
CHLORIDE: 105 meq/L (ref 96–112)
CO2: 24 mEq/L (ref 19–32)
Calcium: 9.5 mg/dL (ref 8.4–10.5)
Creatinine, Ser: 1.02 mg/dL (ref 0.40–1.20)
GFR: 57.7 mL/min — ABNORMAL LOW (ref >60.00)
Glucose, Bld: 102 mg/dL — ABNORMAL HIGH (ref 70–99)
POTASSIUM: 5 meq/L (ref 3.5–5.1)
SODIUM: 134 meq/L — AB (ref 135–145)
TOTAL PROTEIN: 7.6 g/dL (ref 6.0–8.3)
Total Bilirubin: 0.9 mg/dL (ref 0.2–1.2)

## 2017-03-14 LAB — HEMOGLOBIN A1C: Hgb A1c MFr Bld: 5.6 % (ref 4.6–6.5)

## 2017-03-14 LAB — LDL CHOLESTEROL, DIRECT: LDL DIRECT: 47 mg/dL

## 2017-03-14 MED ORDER — HYDROCODONE-ACETAMINOPHEN 5-325 MG PO TABS: 1.0000 | ORAL_TABLET | Freq: Four times a day (QID) | ORAL | 0 refills | Status: DC | PRN

## 2017-03-14 MED ORDER — HYDROCODONE-ACETAMINOPHEN 5-325 MG PO TABS
1.0000 | ORAL_TABLET | Freq: Four times a day (QID) | ORAL | 0 refills | Status: DC | PRN
Start: 2017-03-14 — End: 2017-03-14

## 2017-03-14 NOTE — Patient Instructions (Addendum)
your blood pressure IS IMPROVED,   BUT still a little  above the currently recommended acceptable standards of 120/70, so I am recommending that you  continue to check it once a week at [facility] and let me know if you are not at goal.  We will add  A 3rd medication if need be     I have refilled your Vicodin for may ,  June and July

## 2017-03-14 NOTE — Progress Notes (Signed)
Pre visit review using our clinic review tool, if applicable. No additional management support is needed unless otherwise documented below in the visit note. 

## 2017-03-14 NOTE — Progress Notes (Signed)
Subjective:  Patient ID: Suzanne Cole, female    DOB: 1951/10/17  Age: 66 y.o. MRN: [id number]  CC: The primary encounter diagnosis was Estrogen deficiency. Diagnoses of Screening breast examination, Essential hypertension, Hyperlipidemia LDL goal <70, Prediabetes, Muscle spasm of back, Lumbar radiculitis, and Coronary artery disease involving native coronary artery of native heart without angina pectoris were also pertinent to this visit.  HPI Suzanne Cole presents for FOLLOW UP on chronic back pain managed with daily opioids (2 vicodin), hypertension, , hyperlipidemia with known CAD (2 vessel)   HTN:  Feb 2018 amlodipine dose was increased to 10 mg daily.  Lisinopril was  stopped due to hyperkalemia noted in March 2017  .OUTSIDE READINGS 130/80 USUALLY , OCCASIONALLY 159   Has had had a renal artery ultrasound  docuementing good blood flow (per patient this has been done twice but no record of results in file from [facility] ) .  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    Has 2 vessel CAD by cath 2016, DES to LAD  Taking a probiotic 2 pills daily  Bowels  More regularly and alternative between loose and hard .   Back pain: secondary to severe scoliosis and DDD.  Her pain is aggravated by prolonged sitting or standing, but she is independent of ADLs  She is PAYING OUT OF POCKET FOR ROBAXIN  And prefers not to change muscle relaxers   She has not had any ER visits  And has not requested any early refills.  Her Refill history was confirmed via [place] Controlled Substance database by me today during her visit and there have been no prescriptions of controlled substances filled from any providers other than me. . Last refill April 3   Lab Results  Component Value Date   CHOL 128 03/14/2017   HDL 48.60 03/14/2017   LDLCALC 75 07/10/2016   LDLDIRECT 47.0 03/14/2017   TRIG 241.0 (H) 03/14/2017   CHOLHDL 3 03/14/2017   Lab Results  Component Value Date   ALT 14 03/14/2017   AST  19 03/14/2017   ALKPHOS 44 03/14/2017   BILITOT 0.9 03/14/2017   Lab Results  Component Value Date   HGBA1C 5.6 03/14/2017     Outpatient Medications Prior to Visit  Medication Sig Dispense Refill  . albuterol (PROVENTIL HFA;VENTOLIN HFA) 108 (90 Base) MCG/ACT inhaler Inhale 2 puffs into the lungs every 6 (six) hours as needed for wheezing or shortness of breath. FILL WITH PROAIR PER INSURANCE 1 Inhaler 0  . alendronate (FOSAMAX) 70 MG tablet Take 1 tablet (70 mg total) by mouth once a week. Take with a full glass of water on an empty stomach. 12 tablet 3  . amLODipine (NORVASC) 10 MG tablet Take 1 tablet (10 mg total) by mouth daily. For blood pressure 90 tablet 0  . aspirin 81 MG tablet Take 81 mg by mouth as needed for pain.    . carvedilol (COREG) 12.5 MG tablet Take 1 tablet (12.5 mg total) by mouth 2 (two) times daily. 60 tablet 5  . cetirizine (ZYRTEC) 10 MG tablet Take 10 mg by mouth daily.    . clopidogrel (PLAVIX) 75 MG tablet TAKE ONE TABLET BY MOUTH ONCE DAILY 90 tablet 3  . gabapentin (NEURONTIN) 300 MG capsule Take 1 capsule (300 mg total) by mouth 2 (two) times daily. 180 capsule 1  . meloxicam (MOBIC) 15 MG tablet Take 1 tablet (15 mg total) by mouth daily. 90 tablet 1  .  methocarbamol (ROBAXIN) 750 MG tablet Take 1 tablet (750 mg total) by mouth 4 (four) times daily. 360 tablet 1  . montelukast (SINGULAIR) 10 MG tablet Take 1 tablet (10 mg total) by mouth at bedtime. For seasonal allergies 30 tablet 5  . nitroGLYCERIN (NITROSTAT) 0.3 MG SL tablet Place 1 tablet (0.3 mg total) under the tongue every 5 (five) minutes as needed for chest pain. 30 tablet 0  . Omega-3 Fatty Acids (FISH OIL) 1000 MG CAPS Take 3 capsules (3,000 mg total) by mouth daily. 90 capsule 0  . pantoprazole (PROTONIX) 40 MG tablet Take 1 tablet (40 mg total) by mouth daily. 90 tablet 3  . ranitidine (ZANTAC) 150 MG tablet TAKE 1 TABLET(150 MG) BY MOUTH TWICE DAILY 60 tablet 3  . rosuvastatin (CRESTOR) 40  MG tablet Take 1 tablet (40 mg total) by mouth daily. 30 tablet 3  . triamcinolone cream (KENALOG) 0.1 % Apply 1 application topically 2 (two) times daily. 30 g 0  . HYDROcodone-acetaminophen (NORCO/VICODIN) 5-325 MG tablet Take 1 tablet by mouth every 6 (six) hours as needed. May refill on or after January 11 2017 120 tablet 0  . HYDROcodone-acetaminophen (NORCO/VICODIN) 5-325 MG tablet Take 1 tablet by mouth every 6 (six) hours as needed. May refill on or after February 11 2017 120 tablet 0  . hydrOXYzine (ATARAX/VISTARIL) 25 MG tablet Take 1 tablet (25 mg total) by mouth 3 (three) times daily as needed. (Patient not taking: Reported on 03/14/2017) 15 tablet 0  . benzonatate (TESSALON) 200 MG capsule take 1 capsule by mouth three times a day if needed  0  . methylPREDNISolone (MEDROL DOSEPAK) 4 MG TBPK tablet Take Tapered dose as directed (Patient not taking: Reported on 03/14/2017) 21 tablet 0  . Phenylephrine-Acetaminophen 5-325 MG TABS as needed.     . sodium polystyrene (KAYEXALATE) 15 GM/60ML suspension Take 120 mLs (30 g total) by mouth once. Daily for 2 days (Patient not taking: Reported on 03/14/2017) 240 mL 0   Facility-Administered Medications Prior to Visit  Medication Dose Route Frequency Provider Last Rate Last Dose  . cloNIDine (CATAPRES) tablet 0.2 mg  0.2 mg Oral Once [name], MD        Review of Systems;  Patient denies headache, fevers, malaise, unintentional weight loss, skin rash, eye pain, sinus congestion and sinus pain, sore throat, dysphagia,  hemoptysis , cough, dyspnea, wheezing, chest pain, palpitations, orthopnea, edema, abdominal pain, nausea, melena, diarrhea, constipation, flank pain, dysuria, hematuria, urinary  Frequency, nocturia, numbness, tingling, seizures,  Focal weakness, Loss of consciousness,  Tremor, insomnia, depression, anxiety, and suicidal ideation.      Objective:  BP 136/80 (BP Location: Left Arm, Patient Position: Sitting, Cuff Size: Normal)    Pulse 63   Temp 97.9 F (36.6 C) (Oral)   Resp 15   Ht 4\' 10"  (1.473 m)   Wt 143 lb 6.4 oz (65 kg)   SpO2 98%   BMI 29.97 kg/m   BP Readings from Last 3 Encounters:  03/14/17 136/80  01/01/17 122/68  12/13/16 (!) 202/98    Wt Readings from Last 3 Encounters:  03/14/17 143 lb 6.4 oz (65 kg)  12/13/16 144 lb 3.2 oz (65.4 kg)  09/10/16 134 lb (60.8 kg)    General appearance: alert, cooperative and appears stated age Ears: normal TM's and external ear canals both ears Throat: lips, mucosa, and tongue normal; teeth and gums normal Neck: no adenopathy, no carotid bruit, supple, symmetrical, trachea midline and thyroid not  enlarged, symmetric, no tenderness/mass/nodules Back: symmetric, no curvature. ROM normal. No CVA tenderness. Lungs: clear to auscultation bilaterally Heart: regular rate and rhythm, S1, S2 normal, no murmur, click, rub or gallop Abdomen: soft, non-tender; bowel sounds normal; no masses,  no organomegaly Pulses: 2+ and symmetric Skin: Skin color, texture, turgor normal. No rashes or lesions Lymph nodes: Cervical, supraclavicular, and axillary nodes normal.  Lab Results  Component Value Date   HGBA1C 5.6 03/14/2017   HGBA1C 5.7 11/09/2014    Lab Results  Component Value Date   CREATININE 1.02 03/14/2017   CREATININE 1.03 12/13/2016   CREATININE 1.26 (H) 05/30/2016    Lab Results  Component Value Date   WBC 6.8 08/03/2015   HGB 12.9 08/03/2015   HCT 38.3 08/03/2015   PLT 277.0 08/03/2015   GLUCOSE 102 (H) 03/14/2017   CHOL 128 03/14/2017   TRIG 241.0 (H) 03/14/2017   HDL 48.60 03/14/2017   LDLDIRECT 47.0 03/14/2017   LDLCALC 75 07/10/2016   ALT 14 03/14/2017   AST 19 03/14/2017   NA 134 (L) 03/14/2017   K 5.0 03/14/2017   CL 105 03/14/2017   CREATININE 1.02 03/14/2017   BUN 19 03/14/2017   CO2 24 03/14/2017   TSH 1.29 08/03/2015   INR 0.9 11/28/2014   HGBA1C 5.6 03/14/2017   MICROALBUR <0.7 12/13/2016    Dg Neck Soft Tissue  Result  Date: 06/07/2016 CLINICAL DATA:  Difficulty swallowing earlier today. Extensive poison ivy exposure. EXAM: NECK SOFT TISSUES - 1+ VIEW COMPARISON:  Cervical spine radiographs 11/25/2014 FINDINGS: The soft tissues of the neck are unremarkable. The airway is patent. The upper lungs are clear. Advanced degenerative changes are again noted in the lower cervical spine. There is some progression since prior study. Additional teeth have been extracted since the prior exam. No focal lytic or blastic lesions are present. IMPRESSION: Normal appearance of the soft tissues of the neck. Progressive spondylosis in the cervical spine. Electronically Signed   By: San Morelle M.D.   On: 06/07/2016 18:35   Assessment & Plan:   Problem List Items Addressed This Visit    Coronary artery disease    Hyperlipidemia managed with crestor high dose for known CAD and PAD.  Lab Results  Component Value Date   CHOL 128 03/14/2017   HDL 48.60 03/14/2017   LDLCALC 75 07/10/2016   LDLDIRECT 47.0 03/14/2017   TRIG 241.0 (H) 03/14/2017   CHOLHDL 3 03/14/2017   Lab Results  Component Value Date   ALT 14 03/14/2017   AST 19 03/14/2017   ALKPHOS 44 03/14/2017   BILITOT 0.9 03/14/2017         Hyperlipidemia LDL goal <70   Relevant Orders   Lipid panel (Completed)   LDL cholesterol, direct (Completed)   Hypertension    Improved but not at goal.  he reports compliance with medication regimen  but has an elevated reading today in office.  He has been asked to check his BP at work and  submit readings for evaluation. Renal function will be checked today      Relevant Orders   Comprehensive metabolic panel (Completed)   Lumbar radiculitis    Secondary to DDD ans scoliosis Managed with vicodin and methocarbamol  .  She has not had any ER visits  And has not requested any early refills.  Her Refill history was confirmed via [place] Controlled Substance database by me today during her visit and there have been no  prescriptions of controlled substances filled  from any providers other than me. Newton Pigg  For May , June and July  given today . 3 month follow up needed.        Muscle spasm of back    Secondary to scoliosis and DDD of thoracolumbar spine  Managed with methocarbamol .  She is aware of the potential side effects and adverse events with continued use        Other Visit Diagnoses    Estrogen deficiency    -  Primary   Relevant Orders   DG Bone Density   Screening breast examination       Relevant Orders   MM Digital Screening   Prediabetes       Relevant Orders   Hemoglobin A1c (Completed)      I have discontinued Ms. Leavens benzonatate, Phenylephrine-Acetaminophen, sodium polystyrene, and methylPREDNISolone. I have also changed her HYDROcodone-acetaminophen. Additionally, I am having her maintain her cetirizine, nitroGLYCERIN, aspirin, montelukast, triamcinolone cream, hydrOXYzine, clopidogrel, Fish Oil, carvedilol, methocarbamol, gabapentin, ranitidine, amLODipine, albuterol, pantoprazole, alendronate, meloxicam, rosuvastatin, and HYDROcodone-acetaminophen. We will continue to administer cloNIDine.  Meds ordered this encounter  Medications  . DISCONTD: HYDROcodone-acetaminophen (NORCO/VICODIN) 5-325 MG tablet    Sig: Take 1 tablet by mouth every 6 (six) hours as needed. May refill on or after  April 14 2017    Dispense:  120 tablet    Refill:  0  . DISCONTD: HYDROcodone-acetaminophen (NORCO/VICODIN) 5-325 MG tablet    Sig: Take 1 tablet by mouth every 6 (six) hours as needed. May refill on or after Mar 14 2017    Dispense:  120 tablet    Refill:  0  . DISCONTD: HYDROcodone-acetaminophen (NORCO/VICODIN) 5-325 MG tablet    Sig: Take 1 tablet by mouth every 6 (six) hours as needed. May refill on or after May 14 2017    Dispense:  120 tablet    Refill:  0  . HYDROcodone-acetaminophen (NORCO/VICODIN) 5-325 MG tablet    Sig: Take 1 tablet by mouth every 6 (six) hours as needed. May  refill on or after Mar 14 2017    Dispense:  120 tablet    Refill:  0  . HYDROcodone-acetaminophen (NORCO/VICODIN) 5-325 MG tablet    Sig: Take 1 tablet by mouth every 6 (six) hours as needed. May refill on or after April 14 2017    Dispense:  120 tablet    Refill:  0    Medications Discontinued During This Encounter  Medication Reason  . benzonatate (TESSALON) 200 MG capsule Therapy completed  . methylPREDNISolone (MEDROL DOSEPAK) 4 MG TBPK tablet Therapy completed  . sodium polystyrene (KAYEXALATE) 15 GM/60ML suspension Therapy completed  . Phenylephrine-Acetaminophen 5-325 MG TABS Therapy completed  . HYDROcodone-acetaminophen (NORCO/VICODIN) 5-325 MG tablet Reorder  . HYDROcodone-acetaminophen (NORCO/VICODIN) 5-325 MG tablet Reorder  . HYDROcodone-acetaminophen (NORCO/VICODIN) 5-325 MG tablet Reorder  . HYDROcodone-acetaminophen (NORCO/VICODIN) 5-325 MG tablet Reorder  . HYDROcodone-acetaminophen (NORCO/VICODIN) 5-325 MG tablet Reorder    Follow-up: Return in about 3 months (around 06/11/2017).   [name], MD

## 2017-03-16 ENCOUNTER — Encounter: Payer: Self-pay | Admitting: Internal Medicine

## 2017-03-16 NOTE — Assessment & Plan Note (Signed)
Hyperlipidemia managed with crestor high dose for known CAD and PAD.  Lab Results  Component Value Date   CHOL 128 03/14/2017   HDL 48.60 03/14/2017   LDLCALC 75 07/10/2016   LDLDIRECT 47.0 03/14/2017   TRIG 241.0 (H) 03/14/2017   CHOLHDL 3 03/14/2017   Lab Results  Component Value Date   ALT 14 03/14/2017   AST 19 03/14/2017   ALKPHOS 44 03/14/2017   BILITOT 0.9 03/14/2017

## 2017-03-16 NOTE — Assessment & Plan Note (Addendum)
Improved but not at goal.  he reports compliance with medication regimen  but has an elevated reading today in office.  He has been asked to check his BP at work and  submit readings for evaluation. Renal function will be checked today

## 2017-03-16 NOTE — Assessment & Plan Note (Signed)
Secondary to scoliosis and DDD of thoracolumbar spine  Managed with methocarbamol .  She is aware of the potential side effects and adverse events with continued use

## 2017-03-16 NOTE — Assessment & Plan Note (Signed)
Secondary to DDD ans scoliosis Managed with vicodin and methocarbamol  .  She has not had any ER visits  And has not requested any early refills.  Her Refill history was confirmed via [place] Controlled Substance database by me today during her visit and there have been no prescriptions of controlled substances filled from any providers other than me. Suzanne Cole  For May , June and July  given today . 3 month follow up needed.

## 2017-03-26 ENCOUNTER — Telehealth: Payer: Self-pay | Admitting: Internal Medicine

## 2017-03-26 DIAGNOSIS — I70209 Unspecified atherosclerosis of native arteries of extremities, unspecified extremity: Secondary | ICD-10-CM

## 2017-03-26 DIAGNOSIS — I10 Essential (primary) hypertension: Secondary | ICD-10-CM

## 2017-03-26 NOTE — Telephone Encounter (Signed)
Printed and placed in sign basket with a note to let them know to scan into the procedure tab.

## 2017-03-26 NOTE — Telephone Encounter (Signed)
Pt called and stated that she went to [facility] to have her kidney's check. She states that she went 2 times  03/13/16 and 06/24/16 for vascular study and consultation.

## 2017-03-26 NOTE — Telephone Encounter (Signed)
Both studies found,  In the wrong places in the chart please print and have them rescan them into images or procedures

## 2017-03-26 NOTE — Assessment & Plan Note (Signed)
Screened for RAS left kidney may 2017; (no hemodynamically significant stenosis,  [facility]) .  She si s/p right nephrectomy for Wilms tumor

## 2017-03-26 NOTE — Telephone Encounter (Signed)
FYI

## 2017-04-01 ENCOUNTER — Ambulatory Visit (INDEPENDENT_AMBULATORY_CARE_PROVIDER_SITE_OTHER): Payer: PPO | Admitting: Nurse Practitioner

## 2017-04-01 ENCOUNTER — Encounter: Payer: Self-pay | Admitting: Nurse Practitioner

## 2017-04-01 VITALS — BP 118/66 | HR 62 | Ht 59.0 in | Wt 140.5 lb

## 2017-04-01 DIAGNOSIS — I2510 Atherosclerotic heart disease of native coronary artery without angina pectoris: Secondary | ICD-10-CM | POA: Diagnosis not present

## 2017-04-01 DIAGNOSIS — I10 Essential (primary) hypertension: Secondary | ICD-10-CM

## 2017-04-01 DIAGNOSIS — E785 Hyperlipidemia, unspecified: Secondary | ICD-10-CM | POA: Diagnosis not present

## 2017-04-01 NOTE — Patient Instructions (Signed)
Follow-Up: Your physician wants you to follow-up in: 6 months with Dr. [name]. You will receive a reminder letter in the mail two months in advance. If you don't receive a letter, please call our office to schedule the follow-up appointment.  It was a pleasure seeing you today here in the office. Please do not hesitate to give us a call back if you have any further questions. [phone]  Cayce Quezada A. RN, BSN     
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
## 2017-05-03 ENCOUNTER — Other Ambulatory Visit: Payer: Self-pay | Admitting: Internal Medicine

## 2017-05-16 ENCOUNTER — Telehealth: Payer: Self-pay | Admitting: Internal Medicine

## 2017-05-16 NOTE — Telephone Encounter (Signed)
Called pt to schedule awv. Lvm for pt to call office to schedule appt [phone]). SF

## 2017-06-05 ENCOUNTER — Other Ambulatory Visit: Payer: Self-pay | Admitting: Internal Medicine

## 2017-06-12 ENCOUNTER — Telehealth: Payer: Self-pay | Admitting: Internal Medicine

## 2017-06-12 DIAGNOSIS — M8580 Other specified disorders of bone density and structure, unspecified site: Secondary | ICD-10-CM | POA: Diagnosis not present

## 2017-06-12 DIAGNOSIS — Z1231 Encounter for screening mammogram for malignant neoplasm of breast: Secondary | ICD-10-CM | POA: Diagnosis not present

## 2017-06-12 DIAGNOSIS — M8588 Other specified disorders of bone density and structure, other site: Secondary | ICD-10-CM | POA: Diagnosis not present

## 2017-06-12 DIAGNOSIS — E2839 Other primary ovarian failure: Secondary | ICD-10-CM | POA: Diagnosis not present

## 2017-06-12 DIAGNOSIS — Z780 Asymptomatic menopausal state: Secondary | ICD-10-CM | POA: Diagnosis not present

## 2017-06-12 LAB — HM DEXA SCAN: HM DEXA SCAN: NORMAL

## 2017-06-12 LAB — HM MAMMOGRAPHY

## 2017-06-12 MED ORDER — GABAPENTIN 300 MG PO CAPS: 300.0000 mg | ORAL_CAPSULE | Freq: Three times a day (TID) | ORAL | 1 refills | Status: DC

## 2017-06-12 NOTE — Telephone Encounter (Signed)
Pt was notified that rx was sent in to [facility].

## 2017-06-12 NOTE — Telephone Encounter (Signed)
Pt called back and stated that she has had to start taking the gabapentin 3 times daily and is now out of the the medication. The pt stated that the pharmacy will not refill the medication unless she has a new rx sent in with directions of use stating 3 times daily. Is it ok to send in new rx?

## 2017-06-12 NOTE — Telephone Encounter (Signed)
90 days refill ed at 3 times daily

## 2017-06-12 NOTE — Telephone Encounter (Signed)
LMTCB

## 2017-06-12 NOTE — Telephone Encounter (Signed)
Pt called and stated that she needs a refill on her gabapentin (NEURONTIN) 300 MG capsule, pt states that she has taken 3 on occasion assdn she can not refill until next week. Pt states that she is out and did not sleep last night. Please advise, thank you!  Pharmacy - [id number] [place], [place] - [address]  Call pt @ [phone]

## 2017-06-16 ENCOUNTER — Encounter: Payer: Self-pay | Admitting: Internal Medicine

## 2017-06-16 ENCOUNTER — Ambulatory Visit (INDEPENDENT_AMBULATORY_CARE_PROVIDER_SITE_OTHER): Payer: PPO | Admitting: Internal Medicine

## 2017-06-16 DIAGNOSIS — I10 Essential (primary) hypertension: Secondary | ICD-10-CM

## 2017-06-16 DIAGNOSIS — Z13820 Encounter for screening for osteoporosis: Secondary | ICD-10-CM | POA: Diagnosis not present

## 2017-06-16 DIAGNOSIS — M5416 Radiculopathy, lumbar region: Secondary | ICD-10-CM

## 2017-06-16 MED ORDER — HYDROCODONE-ACETAMINOPHEN 5-325 MG PO TABS: 1.0000 | ORAL_TABLET | Freq: Four times a day (QID) | ORAL | 0 refills | Status: DC | PRN

## 2017-06-16 NOTE — Patient Instructions (Addendum)
Please check your Blood pressure when you first wake up so we can see if the medications are lasting 24 hours   No medication chanes today  Your bone density is normal  Let's repeat in 5 years.   See you in 3 months .  We will repeat fasting labs prior to your visit

## 2017-06-16 NOTE — Progress Notes (Signed)
Subjective:  Patient ID: Suzanne Cole, female    DOB: December 10, 1950  Age: 66 y.o. MRN: [id number]  CC: Diagnoses of Lumbar radiculitis, Essential hypertension, and Screening for osteoporosis were pertinent to this visit.  HPI Suzanne Cole presents for follow up on osteoporosis,  Chronic pain managed with Vicodin and  Hypertension She has not had any ER visits  And has not requested any early refills.  Her Refill history was confirmed via [place] Controlled Substance database by me today during her visit and there have been no prescriptions of controlled substances filled from any providers other than me. .   Declined pneumonia vaccine and Shingrix vaccine   Blood pressure elevated today   Took meds less than an hour ago taking  home readings d at pharmacy,  Readings 120/75  Had mammogram and DEXA last week/ DEXA scan discussed.   had a viral illness last week,  slept for 2 days . Mild nausea  With one episode of vomiting,  No diarrhea ,  all resolved.     Outpatient Medications Prior to Visit  Medication Sig Dispense Refill  . albuterol (PROVENTIL HFA;VENTOLIN HFA) 108 (90 Base) MCG/ACT inhaler Inhale 2 puffs into the lungs every 6 (six) hours as needed for wheezing or shortness of breath. FILL WITH PROAIR PER INSURANCE 1 Inhaler 0  . alendronate (FOSAMAX) 70 MG tablet Take 1 tablet (70 mg total) by mouth once a week. Take with a full glass of water on an empty stomach. 12 tablet 3  . amLODipine (NORVASC) 10 MG tablet take 1 tablet by mouth once daily for blood pressure 90 tablet 0  . aspirin 81 MG tablet Take 81 mg by mouth as needed for pain.    . carvedilol (COREG) 12.5 MG tablet Take 1 tablet (12.5 mg total) by mouth 2 (two) times daily. 60 tablet 5  . cetirizine (ZYRTEC) 10 MG tablet Take 10 mg by mouth daily.    . Cholecalciferol (VITAMIN D3) 1000 units CHEW Chew 1,000 mcg by mouth daily.    . clopidogrel (PLAVIX) 75 MG tablet TAKE ONE TABLET BY MOUTH ONCE DAILY 90 tablet 3  .  gabapentin (NEURONTIN) 300 MG capsule Take 1 capsule (300 mg total) by mouth 3 (three) times daily. 270 capsule 1  . meloxicam (MOBIC) 15 MG tablet Take 1 tablet (15 mg total) by mouth daily. 90 tablet 1  . methocarbamol (ROBAXIN) 750 MG tablet Take 1 tablet (750 mg total) by mouth 4 (four) times daily. 360 tablet 1  . Omega-3 Fatty Acids (FISH OIL) 1000 MG CAPS Take 3 capsules (3,000 mg total) by mouth daily. 90 capsule 0  . pantoprazole (PROTONIX) 40 MG tablet Take 1 tablet (40 mg total) by mouth daily. 90 tablet 3  . ranitidine (ZANTAC) 150 MG tablet TAKE 1 TABLET(150 MG) BY MOUTH TWICE DAILY 60 tablet 3  . rosuvastatin (CRESTOR) 40 MG tablet Take 1 tablet (40 mg total) by mouth daily. 30 tablet 3  . triamcinolone cream (KENALOG) 0.1 % Apply 1 application topically 2 (two) times daily. 30 g 0  . HYDROcodone-acetaminophen (NORCO/VICODIN) 5-325 MG tablet Take 1 tablet by mouth every 6 (six) hours as needed. May refill on or after April 14 2017 120 tablet 0   Facility-Administered Medications Prior to Visit  Medication Dose Route Frequency Provider Last Rate Last Dose  . cloNIDine (CATAPRES) tablet 0.2 mg  0.2 mg Oral Once [name], MD        Review of Systems;  Patient denies headache, fevers, malaise, unintentional weight loss, skin rash, eye pain, sinus congestion and sinus pain, sore throat, dysphagia,  hemoptysis , cough, dyspnea, wheezing, chest pain, palpitations, orthopnea, edema, abdominal pain, nausea, melena, diarrhea, constipation, flank pain, dysuria, hematuria, urinary  Frequency, nocturia, numbness, tingling, seizures,  Focal weakness, Loss of consciousness,  Tremor, insomnia, depression, anxiety, and suicidal ideation.      Objective:  BP (!) 158/80 (BP Location: Left Arm, Patient Position: Sitting, Cuff Size: Normal)   Pulse 62   Temp 98 F (36.7 C) (Oral)   Resp 16   Ht 4\' 11"  (1.499 m)   Wt 139 lb 3.2 oz (63.1 kg)   SpO2 98%   BMI 28.11 kg/m   BP Readings from  Last 3 Encounters:  06/16/17 (!) 158/80  04/01/17 118/66  03/14/17 136/80    Wt Readings from Last 3 Encounters:  06/16/17 139 lb 3.2 oz (63.1 kg)  04/01/17 140 lb 8 oz (63.7 kg)  03/14/17 143 lb 6.4 oz (65 kg)    General appearance: alert, cooperative and appears stated age Ears: normal TM's and external ear canals both ears Throat: lips, mucosa, and tongue normal; teeth and gums normal Neck: no adenopathy, no carotid bruit, supple, symmetrical, trachea midline and thyroid not enlarged, symmetric, no tenderness/mass/nodules Back: symmetric, no curvature. ROM normal. No CVA tenderness. Lungs: clear to auscultation bilaterally Heart: regular rate and rhythm, S1, S2 normal, no murmur, click, rub or gallop Abdomen: soft, non-tender; bowel sounds normal; no masses,  no organomegaly Pulses: 2+ and symmetric Skin: Skin color, texture, turgor normal. No rashes or lesions Lymph nodes: Cervical, supraclavicular, and axillary nodes normal.  Lab Results  Component Value Date   HGBA1C 5.6 03/14/2017   HGBA1C 5.7 11/09/2014    Lab Results  Component Value Date   CREATININE 1.02 03/14/2017   CREATININE 1.03 12/13/2016   CREATININE 1.26 (H) 05/30/2016    Lab Results  Component Value Date   WBC 6.8 08/03/2015   HGB 12.9 08/03/2015   HCT 38.3 08/03/2015   PLT 277.0 08/03/2015   GLUCOSE 102 (H) 03/14/2017   CHOL 128 03/14/2017   TRIG 241.0 (H) 03/14/2017   HDL 48.60 03/14/2017   LDLDIRECT 47.0 03/14/2017   LDLCALC 75 07/10/2016   ALT 14 03/14/2017   AST 19 03/14/2017   NA 134 (L) 03/14/2017   K 5.0 03/14/2017   CL 105 03/14/2017   CREATININE 1.02 03/14/2017   BUN 19 03/14/2017   CO2 24 03/14/2017   TSH 1.29 08/03/2015   INR 0.9 11/28/2014   HGBA1C 5.6 03/14/2017   MICROALBUR <0.7 12/13/2016    Dg Neck Soft Tissue  Result Date: 06/07/2016 CLINICAL DATA:  Difficulty swallowing earlier today. Extensive poison ivy exposure. EXAM: NECK SOFT TISSUES - 1+ VIEW COMPARISON:   Cervical spine radiographs 11/25/2014 FINDINGS: The soft tissues of the neck are unremarkable. The airway is patent. The upper lungs are clear. Advanced degenerative changes are again noted in the lower cervical spine. There is some progression since prior study. Additional teeth have been extracted since the prior exam. No focal lytic or blastic lesions are present. IMPRESSION: Normal appearance of the soft tissues of the neck. Progressive spondylosis in the cervical spine. Electronically Signed   By: San Morelle M.D.   On: 06/07/2016 18:35   Assessment & Plan:   Problem List Items Addressed This Visit    Screening for osteoporosis    DEXA normal.  repeat in 5 years.  Lumbar radiculitis    Secondary to DDD and scoliosis managed with vicodin and methocarbamol  .  She has not had any ER visits  And has not requested any early refills.  Her Refill history was confirmed via [place] Controlled Substance database by me today during her visit and there have been no prescriptions of controlled substances filled from any providers other than me. Newton Pigg  For August, Sept and October  given today . 3 month follow up needed.        Hypertension    Screened for RAS left kidney may 2017; (no hemodynamically significant stenosis,  AVVS) .  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  Home readings have been 120/75 ,  No changes today          A total of 25 minutes of face to face time was spent with patient more than half of which was spent in counselling about the above mentioned conditions  and coordination of care  I have discontinued Ms. Cella HYDROcodone-acetaminophen and HYDROcodone-acetaminophen. I have also changed her HYDROcodone-acetaminophen. Additionally, I am having her maintain her cetirizine, aspirin, triamcinolone cream, clopidogrel, Fish Oil, carvedilol, methocarbamol, ranitidine, albuterol, pantoprazole, alendronate, meloxicam, rosuvastatin, Vitamin D3, amLODipine, and  gabapentin. We will continue to administer cloNIDine.  Meds ordered this encounter  Medications  . DISCONTD: HYDROcodone-acetaminophen (NORCO/VICODIN) 5-325 MG tablet    Sig: Take 1 tablet by mouth every 6 (six) hours as needed. May refill on or after June 16 2017    Dispense:  120 tablet    Refill:  0  . DISCONTD: HYDROcodone-acetaminophen (NORCO/VICODIN) 5-325 MG tablet    Sig: Take 1 tablet by mouth every 6 (six) hours as needed. May refill on or after Sepember 6 2018    Dispense:  120 tablet    Refill:  0  . HYDROcodone-acetaminophen (NORCO/VICODIN) 5-325 MG tablet    Sig: Take 1 tablet by mouth every 6 (six) hours as needed. May refill on or after August 16 2017    Dispense:  120 tablet    Refill:  0    Medications Discontinued During This Encounter  Medication Reason  . HYDROcodone-acetaminophen (NORCO/VICODIN) 5-325 MG tablet Reorder  . HYDROcodone-acetaminophen (NORCO/VICODIN) 5-325 MG tablet Reorder  . HYDROcodone-acetaminophen (NORCO/VICODIN) 5-325 MG tablet Reorder    Follow-up: Return in about 3 months (around 09/16/2017) for medication refills,  fasting labs same day .   [name], MD

## 2017-06-17 NOTE — Assessment & Plan Note (Signed)
Secondary to DDD and scoliosis managed with vicodin and methocarbamol  .  She has not had any ER visits  And has not requested any early refills.  Her Refill history was confirmed via  Controlled Substance database by me today during her visit and there have been no prescriptions of controlled substances filled from any providers other than me. Suzanne Cole  For August, Sept and October  given today . 3 month follow up needed.

## 2017-06-17 NOTE — Assessment & Plan Note (Signed)
Screened for RAS left kidney may 2017; (no hemodynamically significant stenosis,  AVVS) .  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  Home readings have been 120/75 ,  No changes today

## 2017-06-17 NOTE — Assessment & Plan Note (Signed)
DEXA normal-repeat in 5 years.

## 2017-07-16 ENCOUNTER — Telehealth: Payer: Self-pay | Admitting: Internal Medicine

## 2017-07-16 NOTE — Telephone Encounter (Signed)
Left pt message asking to call Ebony Hail back directly at [phone] to schedule AWV. Thanks!  *NOTE* Last AWV 05/18/14

## 2017-08-08 ENCOUNTER — Other Ambulatory Visit: Payer: Self-pay | Admitting: Cardiovascular Disease

## 2017-08-08 ENCOUNTER — Other Ambulatory Visit: Payer: Self-pay | Admitting: Internal Medicine

## 2017-08-25 ENCOUNTER — Other Ambulatory Visit: Payer: Self-pay | Admitting: Internal Medicine

## 2017-08-25 NOTE — Telephone Encounter (Signed)
Left pt message asking to call Ebony Hail back directly at [phone] to schedule AWV. Thanks!  *NOTE* Last AWV 05/18/14

## 2017-09-09 ENCOUNTER — Other Ambulatory Visit: Payer: Self-pay | Admitting: *Deleted

## 2017-09-09 ENCOUNTER — Telehealth: Payer: Self-pay | Admitting: Cardiovascular Disease

## 2017-09-09 DIAGNOSIS — I25810 Atherosclerosis of coronary artery bypass graft(s) without angina pectoris: Secondary | ICD-10-CM

## 2017-09-09 MED ORDER — CLOPIDOGREL BISULFATE 75 MG PO TABS: 75.0000 mg | ORAL_TABLET | Freq: Every day | ORAL | 1 refills | Status: DC

## 2017-09-09 NOTE — Telephone Encounter (Signed)
°*  STAT* If patient is at the pharmacy, call can be transferred to refill team.   1. Which medications need to be refilled? (please list name of each medication and dose if known)  Generic for Plavix   2. Which pharmacy/location (including street and city if local pharmacy) is medication to be sent to? [facility] [address]   3. Do they need a 30 day or 90 day supply? 90 day

## 2017-09-09 NOTE — Telephone Encounter (Signed)
Plavix rx sent to requested pharmacy. #90 R#1

## 2017-09-11 ENCOUNTER — Telehealth: Payer: Self-pay | Admitting: Internal Medicine

## 2017-09-11 NOTE — Telephone Encounter (Signed)
Pt stated that she needs a refill on HYDROcodone-acetaminophen (NORCO/VICODIN) 5-325 MG. Pt has appt scheduled for 09/29/2017

## 2017-09-12 MED ORDER — HYDROCODONE-ACETAMINOPHEN 5-325 MG PO TABS: 1.0000 | ORAL_TABLET | Freq: Four times a day (QID) | ORAL | 0 refills | Status: DC | PRN

## 2017-09-12 NOTE — Telephone Encounter (Signed)
Appointment scheduled for 09/29/17 last fill on Hydrocodone 06/16/17. Ok to fill?

## 2017-09-12 NOTE — Telephone Encounter (Signed)
It can be refilled on Nov  6   Not before,  rx printed

## 2017-09-15 NOTE — Telephone Encounter (Signed)
Do you have this script?

## 2017-09-15 NOTE — Telephone Encounter (Signed)
Spoke with pt and informed her that the rx is ready to be picked up. Rx has been placed up front in folder.

## 2017-09-18 ENCOUNTER — Other Ambulatory Visit: Payer: Self-pay | Admitting: Internal Medicine

## 2017-09-29 ENCOUNTER — Ambulatory Visit: Payer: PPO | Admitting: Internal Medicine

## 2017-09-29 ENCOUNTER — Other Ambulatory Visit: Payer: Self-pay

## 2017-09-29 ENCOUNTER — Encounter: Payer: Self-pay | Admitting: Internal Medicine

## 2017-09-29 VITALS — BP 152/74 | HR 73 | Temp 97.9°F | Resp 15 | Ht 59.0 in | Wt 144.6 lb

## 2017-09-29 DIAGNOSIS — E559 Vitamin D deficiency, unspecified: Secondary | ICD-10-CM | POA: Diagnosis not present

## 2017-09-29 DIAGNOSIS — M5416 Radiculopathy, lumbar region: Secondary | ICD-10-CM

## 2017-09-29 DIAGNOSIS — N184 Chronic kidney disease, stage 4 (severe): Secondary | ICD-10-CM | POA: Diagnosis not present

## 2017-09-29 DIAGNOSIS — I2510 Atherosclerotic heart disease of native coronary artery without angina pectoris: Secondary | ICD-10-CM

## 2017-09-29 DIAGNOSIS — Z79899 Other long term (current) drug therapy: Secondary | ICD-10-CM

## 2017-09-29 MED ORDER — CARVEDILOL 12.5 MG PO TABS: 12.5000 mg | ORAL_TABLET | Freq: Two times a day (BID) | ORAL | 2 refills | Status: DC

## 2017-09-29 MED ORDER — HYDROCODONE-ACETAMINOPHEN 5-325 MG PO TABS: 1.0000 | ORAL_TABLET | Freq: Four times a day (QID) | ORAL | 0 refills | Status: DC | PRN

## 2017-09-29 MED ORDER — HYDROCODONE-ACETAMINOPHEN 5-325 MG PO TABS
1.0000 | ORAL_TABLET | Freq: Four times a day (QID) | ORAL | 0 refills | Status: DC | PRN
Start: 2017-09-29 — End: 2017-09-29

## 2017-09-29 NOTE — Patient Instructions (Signed)
You have gained 5 lbs since your last visit!  Cut back on the sugary snacks  Please continue ot check your BP once a week while resting and call if readings are > 140/80   Return in late Feb/early March for follow up

## 2017-09-29 NOTE — Progress Notes (Signed)
Subjective:  Patient ID: Suzanne Cole, female    DOB: 12/15/1950  Age: 66 y.o. MRN: [id number]  CC: The primary encounter diagnosis was Long-term use of high-risk medication. Diagnoses of Vitamin D deficiency, Lumbar radiculitis, Coronary artery disease involving native coronary artery of native heart without angina pectoris, Chronic kidney disease, stage 4, severely decreased GFR ([place]), and Chronic kidney disease, stage 4 (severe) ([place]) were also pertinent to this visit.  HPI Indira A Cazeau presents for FOLLOW UP ON PAIN MANAGEMENT OF lumbar radiculitis , hypertension   Refill history confirmed via [place] Controlled Substance databas, accessed by me today..  Using dulcolax to prevent constipaton along with magnesium   Has not smoked in years  Trouble sleeping due to hip pain   Using "reel time"  Expensive balm    Using it on the right shoulder as well   Weight gain of 5 lbs,  Was drinking soda.  Using an air fryer   Hypertension: home readings are high when she is active but improve when resting 120/60   Not taking any vitamin D   Outpatient Medications Prior to Visit  Medication Sig Dispense Refill  . albuterol (PROVENTIL HFA;VENTOLIN HFA) 108 (90 Base) MCG/ACT inhaler Inhale 2 puffs into the lungs every 6 (six) hours as needed for wheezing or shortness of breath. FILL WITH PROAIR PER INSURANCE 1 Inhaler 0  . alendronate (FOSAMAX) 70 MG tablet Take 1 tablet (70 mg total) by mouth once a week. Take with a full glass of water on an empty stomach. 12 tablet 3  . amLODipine (NORVASC) 10 MG tablet take 1 tablet by mouth once daily for high blood pressure 90 tablet 0  . aspirin 81 MG tablet Take 81 mg by mouth as needed for pain.    . carvedilol (COREG) 12.5 MG tablet Take 1 tablet (12.5 mg total) 2 (two) times daily by mouth. 60 tablet 2  . cetirizine (ZYRTEC) 10 MG tablet Take 10 mg by mouth daily.    . Cholecalciferol (VITAMIN D3) 1000 units CHEW Chew 1,000 mcg by mouth daily.    .  clopidogrel (PLAVIX) 75 MG tablet Take 1 tablet (75 mg total) by mouth daily. 90 tablet 1  . gabapentin (NEURONTIN) 300 MG capsule Take 1 capsule (300 mg total) by mouth 3 (three) times daily. 270 capsule 1  . methocarbamol (ROBAXIN) 750 MG tablet take 1 tablet by mouth four times a day 120 tablet 1  . Omega-3 Fatty Acids (FISH OIL) 1000 MG CAPS Take 3 capsules (3,000 mg total) by mouth daily. 90 capsule 0  . pantoprazole (PROTONIX) 40 MG tablet Take 1 tablet (40 mg total) by mouth daily. 90 tablet 3  . ranitidine (ZANTAC) 150 MG tablet take 1 tablet by mouth twice a day 240 tablet 0  . rosuvastatin (CRESTOR) 40 MG tablet take 1 tablet by mouth once daily 30 tablet 2  . triamcinolone cream (KENALOG) 0.1 % Apply 1 application topically 2 (two) times daily. 30 g 0  . HYDROcodone-acetaminophen (NORCO/VICODIN) 5-325 MG tablet Take 1 tablet by mouth every 6 (six) hours as needed. May refill on or after September 16 2017 120 tablet 0  . meloxicam (MOBIC) 15 MG tablet Take 1 tablet (15 mg total) by mouth daily. 90 tablet 1   Facility-Administered Medications Prior to Visit  Medication Dose Route Frequency Provider Last Rate Last Dose  . cloNIDine (CATAPRES) tablet 0.2 mg  0.2 mg Oral Once [name], MD  Review of Systems;  Patient denies headache, fevers, malaise, unintentional weight loss, skin rash, eye pain, sinus congestion and sinus pain, sore throat, dysphagia,  hemoptysis , cough, dyspnea, wheezing, chest pain, palpitations, orthopnea, edema, abdominal pain, nausea, melena, diarrhea, constipation, flank pain, dysuria, hematuria, urinary  Frequency, nocturia, numbness, tingling, seizures,  Focal weakness, Loss of consciousness,  Tremor, insomnia, depression, anxiety, and suicidal ideation.      Objective:  BP (!) 152/74 (BP Location: Left Arm, Patient Position: Sitting, Cuff Size: Large)   Pulse 73   Temp 97.9 F (36.6 C) (Oral)   Resp 15   Ht 4\' 11"  (1.499 m)   Wt 144 lb 9.6  oz (65.6 kg)   SpO2 96%   BMI 29.21 kg/m   BP Readings from Last 3 Encounters:  09/29/17 (!) 152/74  06/16/17 (!) 158/80  04/01/17 118/66    Wt Readings from Last 3 Encounters:  09/29/17 144 lb 9.6 oz (65.6 kg)  06/16/17 139 lb 3.2 oz (63.1 kg)  04/01/17 140 lb 8 oz (63.7 kg)    General appearance: alert, cooperative and appears stated age Ears: normal TM's and external ear canals both ears Throat: lips, mucosa, and tongue normal; teeth and gums normal Neck: no adenopathy, no carotid bruit, supple, symmetrical, trachea midline and thyroid not enlarged, symmetric, no tenderness/mass/nodules Back: symmetric, no curvature. ROM normal. No CVA tenderness. Lungs: clear to auscultation bilaterally Heart: regular rate and rhythm, S1, S2 normal, no murmur, click, rub or gallop Abdomen: soft, non-tender; bowel sounds normal; no masses,  no organomegaly Pulses: 2+ and symmetric Skin: Skin color, texture, turgor normal. No rashes or lesions Lymph nodes: Cervical, supraclavicular, and axillary nodes normal.  Lab Results  Component Value Date   HGBA1C 5.6 03/14/2017   HGBA1C 5.7 11/09/2014    Lab Results  Component Value Date   CREATININE 3.25 (H) 09/29/2017   CREATININE 1.02 03/14/2017   CREATININE 1.03 12/13/2016    Lab Results  Component Value Date   WBC 6.8 08/03/2015   HGB 12.9 08/03/2015   HCT 38.3 08/03/2015   PLT 277.0 08/03/2015   GLUCOSE 92 09/29/2017   CHOL 128 03/14/2017   TRIG 241.0 (H) 03/14/2017   HDL 48.60 03/14/2017   LDLDIRECT 47.0 03/14/2017   LDLCALC 75 07/10/2016   ALT 12 09/29/2017   AST 20 09/29/2017   NA 133 (L) 09/29/2017   K 4.3 09/29/2017   CL 100 09/29/2017   CREATININE 3.25 (H) 09/29/2017   BUN 21 09/29/2017   CO2 26 09/29/2017   TSH 1.29 08/03/2015   INR 0.9 11/28/2014   HGBA1C 5.6 03/14/2017   MICROALBUR <0.7 12/13/2016    Dg Neck Soft Tissue  Result Date: 06/07/2016 CLINICAL DATA:  Difficulty swallowing earlier today. Extensive  poison ivy exposure. EXAM: NECK SOFT TISSUES - 1+ VIEW COMPARISON:  Cervical spine radiographs 11/25/2014 FINDINGS: The soft tissues of the neck are unremarkable. The airway is patent. The upper lungs are clear. Advanced degenerative changes are again noted in the lower cervical spine. There is some progression since prior study. Additional teeth have been extracted since the prior exam. No focal lytic or blastic lesions are present. IMPRESSION: Normal appearance of the soft tissues of the neck. Progressive spondylosis in the cervical spine. Electronically Signed   By: San Morelle M.D.   On: 06/07/2016 18:35   Assessment & Plan:   Problem List Items Addressed This Visit    Chronic kidney disease, stage 4 (severe) (HCC)    GFR has dropped  to 15 ml/min.  Repeat in one week ,  Suspend meloxicam      Coronary artery disease    Asymptomatic. continue carvedilol, clopidogrel, rosuvastatin and aspirin       Lumbar radiculitis    Secondary to DDD and scoliosis managed with vicodin and methocarbamol  .  She has not had any ER visits  And has not requested any early refills.  Her Refill history was confirmed via [place] Controlled Substance database by me today during her visit and there have been no prescriptions of controlled substances filled from any providers other than me. Newton Pigg  For November, December and January  Were given today . 3 month follow up needed.         Other Visit Diagnoses    Long-term use of high-risk medication    -  Primary   Relevant Orders   Comprehensive metabolic panel (Completed)   Vitamin D deficiency       Relevant Orders   VITAMIN D 25 Hydroxy (Vit-D Deficiency, Fractures) (Completed)   Chronic kidney disease, stage 4, severely decreased GFR (HCC)       Relevant Orders   Basic metabolic panel      I have discontinued Natashia A. Consiglio's meloxicam, HYDROcodone-acetaminophen, and HYDROcodone-acetaminophen. I have also changed her HYDROcodone-acetaminophen.  Additionally, I am having her maintain her cetirizine, aspirin, triamcinolone cream, Fish Oil, albuterol, pantoprazole, alendronate, Vitamin D3, gabapentin, rosuvastatin, amLODipine, methocarbamol, clopidogrel, ranitidine, and carvedilol. We will continue to administer cloNIDine.  Meds ordered this encounter  Medications  . DISCONTD: HYDROcodone-acetaminophen (NORCO/VICODIN) 5-325 MG tablet    Sig: Take 1 tablet every 6 (six) hours as needed by mouth. May refill on or after October 16 2017    Dispense:  124 tablet    Refill:  0  . DISCONTD: HYDROcodone-acetaminophen (NORCO/VICODIN) 5-325 MG tablet    Sig: Take 1 tablet every 6 (six) hours as needed by mouth. May refill on or after November 16 2017    Dispense:  124 tablet    Refill:  0  . HYDROcodone-acetaminophen (NORCO/VICODIN) 5-325 MG tablet    Sig: Take 1 tablet every 6 (six) hours as needed by mouth. May refill on or after December 17 2017    Dispense:  120 tablet    Refill:  0    Medications Discontinued During This Encounter  Medication Reason  . HYDROcodone-acetaminophen (NORCO/VICODIN) 5-325 MG tablet Reorder  . HYDROcodone-acetaminophen (NORCO/VICODIN) 5-325 MG tablet Reorder  . HYDROcodone-acetaminophen (NORCO/VICODIN) 5-325 MG tablet Reorder  . meloxicam (MOBIC) 15 MG tablet     Follow-up: Return in about 3 months (around 12/30/2017) for before March 6 .   [name], MD

## 2017-09-30 DIAGNOSIS — N184 Chronic kidney disease, stage 4 (severe): Secondary | ICD-10-CM | POA: Insufficient documentation

## 2017-09-30 DIAGNOSIS — I130 Hypertensive heart and chronic kidney disease with heart failure and stage 1 through stage 4 chronic kidney disease, or unspecified chronic kidney disease: Secondary | ICD-10-CM | POA: Insufficient documentation

## 2017-09-30 LAB — COMPREHENSIVE METABOLIC PANEL
ALT: 12 U/L (ref 0–35)
AST: 20 U/L (ref 0–37)
Albumin: 4.3 g/dL (ref 3.5–5.2)
Alkaline Phosphatase: 41 U/L (ref 39–117)
BILIRUBIN TOTAL: 0.8 mg/dL (ref 0.2–1.2)
BUN: 21 mg/dL (ref 6–23)
CO2: 26 meq/L (ref 19–32)
CREATININE: 3.25 mg/dL — AB (ref 0.40–1.20)
Calcium: 9.7 mg/dL (ref 8.4–10.5)
Chloride: 100 mEq/L (ref 96–112)
GFR: 15.12 mL/min — ABNORMAL LOW (ref >60.00)
GLUCOSE: 92 mg/dL (ref 70–99)
Potassium: 4.3 mEq/L (ref 3.5–5.1)
SODIUM: 133 meq/L — AB (ref 135–145)
Total Protein: 7.3 g/dL (ref 6.0–8.3)

## 2017-09-30 LAB — VITAMIN D 25 HYDROXY (VIT D DEFICIENCY, FRACTURES): VITD: 25.18 ng/mL — ABNORMAL LOW (ref 30.00–100.00)

## 2017-09-30 NOTE — Assessment & Plan Note (Addendum)
GFR has dropped to 15 ml/min.  Repeat in one week ,  Suspend meloxicam

## 2017-09-30 NOTE — Assessment & Plan Note (Signed)
Secondary to DDD and scoliosis managed with vicodin and methocarbamol  .  She has not had any ER visits  And has not requested any early refills.  Her Refill history was confirmed via  Controlled Substance database by me today during her visit and there have been no prescriptions of controlled substances filled from any providers other than me. Newton Pigg  For November, December and January  Were given today . 3 month follow up needed.

## 2017-09-30 NOTE — Assessment & Plan Note (Signed)
Asymptomatic. continue carvedilol, clopidogrel, rosuvastatin and aspirin  

## 2017-10-01 ENCOUNTER — Encounter: Payer: Self-pay | Admitting: Nurse Practitioner

## 2017-10-01 ENCOUNTER — Ambulatory Visit: Payer: PPO | Admitting: Nurse Practitioner

## 2017-10-01 VITALS — BP 120/60 | HR 65 | Ht 59.0 in | Wt 138.8 lb

## 2017-10-01 DIAGNOSIS — I10 Essential (primary) hypertension: Secondary | ICD-10-CM | POA: Diagnosis not present

## 2017-10-01 DIAGNOSIS — I2510 Atherosclerotic heart disease of native coronary artery without angina pectoris: Secondary | ICD-10-CM

## 2017-10-01 DIAGNOSIS — E785 Hyperlipidemia, unspecified: Secondary | ICD-10-CM | POA: Diagnosis not present
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
## 2017-10-01 NOTE — Patient Instructions (Signed)

## 2017-10-07 ENCOUNTER — Other Ambulatory Visit (INDEPENDENT_AMBULATORY_CARE_PROVIDER_SITE_OTHER): Payer: PPO

## 2017-10-07 DIAGNOSIS — N184 Chronic kidney disease, stage 4 (severe): Secondary | ICD-10-CM

## 2017-10-07 LAB — BASIC METABOLIC PANEL
BUN: 24 mg/dL — ABNORMAL HIGH (ref 6–23)
CALCIUM: 9.5 mg/dL (ref 8.4–10.5)
CO2: 20 meq/L (ref 19–32)
Chloride: 108 mEq/L (ref 96–112)
Creatinine, Ser: 1.15 mg/dL (ref 0.40–1.20)
GFR: 50.16 mL/min — ABNORMAL LOW (ref >60.00)
GLUCOSE: 133 mg/dL — AB (ref 70–99)
Potassium: 4.5 mEq/L (ref 3.5–5.1)
SODIUM: 137 meq/L (ref 135–145)

## 2017-10-08 ENCOUNTER — Other Ambulatory Visit: Payer: Self-pay | Admitting: Internal Medicine

## 2017-10-08 ENCOUNTER — Encounter: Payer: Self-pay | Admitting: Internal Medicine

## 2017-10-08 DIAGNOSIS — N179 Acute kidney failure, unspecified: Secondary | ICD-10-CM

## 2017-10-08 NOTE — Addendum Note (Signed)
Addended by: Britt Bottom on: 10/08/2017 08:48 AM   Modules accepted: Orders

## 2017-10-31 ENCOUNTER — Other Ambulatory Visit: Payer: Self-pay | Admitting: Internal Medicine

## 2017-11-19 ENCOUNTER — Other Ambulatory Visit: Payer: Self-pay | Admitting: Cardiovascular Disease

## 2017-11-19 ENCOUNTER — Other Ambulatory Visit: Payer: Self-pay | Admitting: Internal Medicine

## 2018-01-01 ENCOUNTER — Other Ambulatory Visit: Payer: Self-pay | Admitting: Internal Medicine

## 2018-01-12 ENCOUNTER — Other Ambulatory Visit: Payer: Self-pay | Admitting: Cardiovascular Disease

## 2018-01-19 ENCOUNTER — Other Ambulatory Visit: Payer: Self-pay | Admitting: Internal Medicine

## 2018-01-19 NOTE — Telephone Encounter (Signed)
Copied from [place] [id number]. Topic: Quick Communication - Rx Refill/Question >> Jan 19, 2018  1:59 PM Arletha Grippe wrote: Medication:  HYDROcodone-acetaminophen (NORCO/VICODIN) 5-325 MG tablet  Has the patient contacted their pharmacy? No. Control   (Agent: If no, request that the patient contact the pharmacy for the refill.)   Preferred Pharmacy (with phone number or street name):call to pick up [phone]    Agent: Please be advised that RX refills may take up to 3 business days. We ask that you follow-up with your pharmacy.

## 2018-01-19 NOTE — Telephone Encounter (Signed)
Hydrocodone  Refill  Request  LOV  09/29/2017  Last  Fill    09/29/2017

## 2018-01-21 MED ORDER — HYDROCODONE-ACETAMINOPHEN 5-325 MG PO TABS: 1.0000 | ORAL_TABLET | Freq: Four times a day (QID) | ORAL | 0 refills | Status: DC | PRN

## 2018-01-21 NOTE — Telephone Encounter (Signed)
Refilled: 09/29/2017 Last OV: 09/29/2017 Next OV: 01/27/2018

## 2018-01-22 NOTE — Telephone Encounter (Signed)
Pt has picked up rx 

## 2018-01-27 ENCOUNTER — Ambulatory Visit: Payer: Self-pay | Admitting: Internal Medicine

## 2018-01-29 ENCOUNTER — Ambulatory Visit (INDEPENDENT_AMBULATORY_CARE_PROVIDER_SITE_OTHER): Payer: PPO | Admitting: Internal Medicine

## 2018-01-29 ENCOUNTER — Encounter: Payer: Self-pay | Admitting: Internal Medicine

## 2018-01-29 VITALS — BP 130/72 | HR 65 | Temp 97.6°F | Resp 15 | Ht 59.0 in | Wt 143.6 lb

## 2018-01-29 DIAGNOSIS — E785 Hyperlipidemia, unspecified: Secondary | ICD-10-CM | POA: Diagnosis not present

## 2018-01-29 DIAGNOSIS — M5416 Radiculopathy, lumbar region: Secondary | ICD-10-CM

## 2018-01-29 DIAGNOSIS — Z7902 Long term (current) use of antithrombotics/antiplatelets: Secondary | ICD-10-CM | POA: Diagnosis not present

## 2018-01-29 DIAGNOSIS — N179 Acute kidney failure, unspecified: Secondary | ICD-10-CM

## 2018-01-29 DIAGNOSIS — I10 Essential (primary) hypertension: Secondary | ICD-10-CM

## 2018-01-29 DIAGNOSIS — E559 Vitamin D deficiency, unspecified: Secondary | ICD-10-CM

## 2018-01-29 DIAGNOSIS — N184 Chronic kidney disease, stage 4 (severe): Secondary | ICD-10-CM

## 2018-01-29 LAB — CBC WITH DIFFERENTIAL/PLATELET
BASOS ABS: 0.1 10*3/uL (ref 0.0–0.1)
Basophils Relative: 0.7 % (ref 0.0–3.0)
EOS ABS: 0.2 10*3/uL (ref 0.0–0.7)
Eosinophils Relative: 2.4 % (ref 0.0–5.0)
HEMATOCRIT: 35.7 % — AB (ref 36.0–46.0)
HEMOGLOBIN: 12 g/dL (ref 12.0–15.0)
LYMPHS PCT: 27 % (ref 12.0–46.0)
Lymphs Abs: 2.3 10*3/uL (ref 0.7–4.0)
MCHC: 33.5 g/dL (ref 30.0–36.0)
MCV: 88.2 fl (ref 78.0–100.0)
MONOS PCT: 10.1 % (ref 3.0–12.0)
Monocytes Absolute: 0.9 10*3/uL (ref 0.1–1.0)
NEUTROS ABS: 5.1 10*3/uL (ref 1.4–7.7)
Neutrophils Relative %: 59.8 % (ref 43.0–77.0)
PLATELETS: 316 10*3/uL (ref 150.0–400.0)
RBC: 4.05 Mil/uL (ref 3.87–5.11)
RDW: 13.2 % (ref 11.5–15.5)
WBC: 8.6 10*3/uL (ref 4.0–10.5)

## 2018-01-29 LAB — VITAMIN D 25 HYDROXY (VIT D DEFICIENCY, FRACTURES): VITD: 24.47 ng/mL — ABNORMAL LOW (ref 30.00–100.00)

## 2018-01-29 MED ORDER — HYDROCODONE-ACETAMINOPHEN 10-325 MG PO TABS: 1.0000 | ORAL_TABLET | Freq: Four times a day (QID) | ORAL | 0 refills | Status: DC | PRN

## 2018-01-29 NOTE — Progress Notes (Signed)
Subjective:  Patient ID: Suzanne Cole, female    DOB: Feb 14, 1951  Age: 67 y.o. MRN: [id number]  CC: The primary encounter diagnosis was Hyperlipidemia LDL goal <70. Diagnoses of Essential hypertension, Chronic kidney disease, stage 4 (severe) (HCC), Vitamin D deficiency, Encounter for current long term use of antiplatelet drug, Lumbar radiculitis, and Acute renal failure, unspecified acute renal failure type [facility]) were also pertinent to this visit.  HPI [name] presents for medication refill.  She is receiving  hydrocodone for management of chronic low back and hip  Pain related to severe scoliosis resulting in lumbar and cervical radiculitis .  She states that for the last several months her pain has become persistent and is not controlled with current dose of hydrocodone 5 mg. She has been adding OTC analgesics including capsaicin , but cannot take NSAIDS due acute renal failure caused by NSASIDs,  She is also using  muscle relaxers   Has tried "real time " cream which is helpful but too $$$$   Requesting an increase in pain medications to oxycodone bc she is still in pain after using the 5 mg   She is trying to  stay active because she has young grandchildren that she is actively involved with aon a daily bais.   She  does not drink alcohol.  Discussed increase to 10 mg hydrocodone for one month.      2) recurrent abdominal pain: Since age 50 she has  Had Infrequent episodes of self limiting SBO due to surgical adhesions   that presents with severe  lower abdominal pain  anorexia, vomiting and constipation lasting 24 hours,  Followed by profuse diarrhea. The episodes typically resolve in less than 48 hours Occurring 1-2 times per year. Last episode was before thanksgiving       For   wearig blu/purple hair as a lifestyle change.        Outpatient Medications Prior to Visit  Medication Sig Dispense Refill  . albuterol (PROVENTIL HFA;VENTOLIN HFA) 108 (90 Base) MCG/ACT  inhaler Inhale 2 puffs into the lungs every 6 (six) hours as needed for wheezing or shortness of breath. FILL WITH PROAIR PER INSURANCE 1 Inhaler 0  . amLODipine (NORVASC) 10 MG tablet take 1 tablet by mouth once daily for high blood pressure 90 tablet 1  . aspirin 81 MG tablet Take 81 mg by mouth as needed for pain.    . carvedilol (COREG) 12.5 MG tablet Take 1 tablet (12.5 mg total) 2 (two) times daily by mouth. 60 tablet 2  . cetirizine (ZYRTEC) 10 MG tablet Take 10 mg by mouth daily.    . Cholecalciferol (VITAMIN D3) 1000 units CHEW Chew 1,000 mcg by mouth daily.    . clopidogrel (PLAVIX) 75 MG tablet Take 1 tablet (75 mg total) by mouth daily. 90 tablet 1  . gabapentin (NEURONTIN) 300 MG capsule Take 1 capsule (300 mg total) by mouth 3 (three) times daily. 270 capsule 1  . methocarbamol (ROBAXIN) 750 MG tablet take 1 tablet by mouth four times a day 120 tablet 1  . methocarbamol (ROBAXIN) 750 MG tablet TAKE 1 TABLET(750 MG) BY MOUTH FOUR TIMES DAILY 360 tablet 1  . Omega-3 Fatty Acids (FISH OIL) 1000 MG CAPS Take 3 capsules (3,000 mg total) by mouth daily. 90 capsule 0  . pantoprazole (PROTONIX) 40 MG tablet TAKE 1 TABLET BY MOUTH ONCE DAILY 90 tablet 0  . ranitidine (ZANTAC) 150 MG tablet take 1 tablet by mouth twice a  day 240 tablet 0  . rosuvastatin (CRESTOR) 40 MG tablet take 1 tablet by mouth once daily 30 tablet 5  . triamcinolone cream (KENALOG) 0.1 % Apply 1 application topically 2 (two) times daily. 30 g 0  . alendronate (FOSAMAX) 70 MG tablet Take 1 tablet (70 mg total) by mouth once a week. Take with a full glass of water on an empty stomach. 12 tablet 3  . HYDROcodone-acetaminophen (NORCO/VICODIN) 5-325 MG tablet Take 1 tablet by mouth every 6 (six) hours as needed. May refill on or after January 21 2018 120 tablet 0   Facility-Administered Medications Prior to Visit  Medication Dose Route Frequency Provider Last Rate Last Dose  . cloNIDine (CATAPRES) tablet 0.2 mg  0.2 mg Oral  Once [name], MD        Review of Systems;  Patient denies headache, fevers, malaise, unintentional weight loss, skin rash, eye pain, sinus congestion and sinus pain, sore throat, dysphagia,  hemoptysis , cough, dyspnea, wheezing, chest pain, palpitations, orthopnea, edema, abdominal pain, nausea, melena, diarrhea, constipation, flank pain, dysuria, hematuria, urinary  Frequency, nocturia, numbness, tingling, seizures,  Focal weakness, Loss of consciousness,  Tremor, insomnia, depression, anxiety, and suicidal ideation.      Objective:  BP 130/72 (BP Location: Left Arm, Patient Position: Sitting, Cuff Size: Normal)   Pulse 65   Temp 97.6 F (36.4 C) (Oral)   Resp 15   Ht 4\' 11"  (1.499 m)   Wt 143 lb 9.6 oz (65.1 kg)   SpO2 98%   BMI 29.00 kg/m   BP Readings from Last 3 Encounters:  01/29/18 130/72  10/01/17 120/60  09/29/17 (!) 152/74    Wt Readings from Last 3 Encounters:  01/29/18 143 lb 9.6 oz (65.1 kg)  10/01/17 138 lb 12 oz (62.9 kg)  09/29/17 144 lb 9.6 oz (65.6 kg)    General appearance: alert, cooperative and appears stated age Ears: normal TM's and external ear canals both ears Throat: lips, mucosa, and tongue normal; teeth and gums normal Neck: no adenopathy, no carotid bruit, supple, symmetrical, trachea midline and thyroid not enlarged, symmetric, no tenderness/mass/nodules Back: symmetric, no curvature. ROM normal. No CVA tenderness. Lungs: clear to auscultation bilaterally Heart: regular rate and rhythm, S1, S2 normal, no murmur, click, rub or gallop Abdomen: soft, non-tender; bowel sounds normal; no masses,  no organomegaly Pulses: 2+ and symmetric Skin: Skin color, texture, turgor normal. No rashes or lesions Lymph nodes: Cervical, supraclavicular, and axillary nodes normal.  Lab Results  Component Value Date   HGBA1C 5.6 03/14/2017   HGBA1C 5.7 11/09/2014    Lab Results  Component Value Date   CREATININE 1.99 (H) 01/29/2018   CREATININE  1.15 10/07/2017   CREATININE 3.25 (H) 09/29/2017    Lab Results  Component Value Date   WBC 8.6 01/29/2018   HGB 12.0 01/29/2018   HCT 35.7 (L) 01/29/2018   PLT 316.0 01/29/2018   GLUCOSE 84 01/29/2018   CHOL 109 01/29/2018   TRIG 159.0 (H) 01/29/2018   HDL 54.30 01/29/2018   LDLDIRECT 47.0 03/14/2017   LDLCALC 23 01/29/2018   ALT 11 01/29/2018   AST 18 01/29/2018   NA 133 (L) 01/29/2018   K 5.1 01/29/2018   CL 104 01/29/2018   CREATININE 1.99 (H) 01/29/2018   BUN 16 01/29/2018   CO2 21 01/29/2018   TSH 1.29 08/03/2015   INR 0.9 11/28/2014   HGBA1C 5.6 03/14/2017   MICROALBUR <0.7 12/13/2016    Dg Neck Soft  Tissue  Result Date: 06/07/2016 CLINICAL DATA:  Difficulty swallowing earlier today. Extensive poison ivy exposure. EXAM: NECK SOFT TISSUES - 1+ VIEW COMPARISON:  Cervical spine radiographs 11/25/2014 FINDINGS: The soft tissues of the neck are unremarkable. The airway is patent. The upper lungs are clear. Advanced degenerative changes are again noted in the lower cervical spine. There is some progression since prior study. Additional teeth have been extracted since the prior exam. No focal lytic or blastic lesions are present. IMPRESSION: Normal appearance of the soft tissues of the neck. Progressive spondylosis in the cervical spine. Electronically Signed   By: San Morelle M.D.   On: 06/07/2016 18:35   Assessment & Plan:   Problem List Items Addressed This Visit    Hyperlipidemia LDL goal <70 - Primary   Relevant Orders   Lipid panel (Completed)   RESOLVED: Chronic kidney disease, stage 4 (severe) (HCC)   Lumbar radiculitis    Secondary to DDD and scoliosis managed with vicodin and methocarbamol  .  She has not had any ER visits  And has not requested any early refills.  Her Refill history was confirmed via [place] Controlled Substance database by me today during her visit and there have been no prescriptions of controlled substances filled from any providers other  than me. .Today she reports that her pain is not controlled with the low dose vicodiin and is requesting oxycodone.  I have offered an increase is the dose of vicodin  from 5 mg to 10 mg as a one month trial       Hypertension    Well controlled on current regimen.  She has had a significant change in renal function no changes today.  Lab Results  Component Value Date   CREATININE 1.99 (H) 01/29/2018   Lab Results  Component Value Date   NA 133 (L) 01/29/2018   K 5.1 01/29/2018   CL 104 01/29/2018   CO2 21 01/29/2018         Relevant Orders   Comprehensive metabolic panel (Completed)   Acute renal failure (ARF) (HCC)    Etiology unclear. Previous episode was due to use of NSAIDS.  She is s/p nephrectomy for history of Wilms tumor.  Referral to nephrology;  Suspend alendronate.       Relevant Orders   Ambulatory referral to Nephrology    Other Visit Diagnoses    Vitamin D deficiency       Relevant Orders   VITAMIN D 25 Hydroxy (Vit-D Deficiency, Fractures) (Completed)   Encounter for current long term use of antiplatelet drug       Relevant Orders   CBC with Differential/Platelet (Completed)      I have discontinued Eastyn A. Lipschutz's alendronate and HYDROcodone-acetaminophen. I am also having her start on HYDROcodone-acetaminophen. Additionally, I am having her maintain her cetirizine, aspirin, triamcinolone cream, Fish Oil, albuterol, Vitamin D3, gabapentin, clopidogrel, ranitidine, carvedilol, methocarbamol, rosuvastatin, amLODipine, methocarbamol, and pantoprazole. We will continue to administer cloNIDine.  Meds ordered this encounter  Medications  . HYDROcodone-acetaminophen (NORCO) 10-325 MG tablet    Sig: Take 1 tablet by mouth every 6 (six) hours as needed.    Dispense:  120 tablet    Refill:  0    Medications Discontinued During This Encounter  Medication Reason  . HYDROcodone-acetaminophen (NORCO/VICODIN) 5-325 MG tablet   . alendronate (FOSAMAX) 70 MG  tablet     Follow-up: No follow-ups on file.   [name], MD

## 2018-01-29 NOTE — Patient Instructions (Signed)
I am increasing the strength of the hydrocodone to 10 mg every 6 hours  For one month  If this strength does not manage your pain better,  Return in one month for medication change

## 2018-01-30 LAB — LIPID PANEL
Cholesterol: 109 mg/dL (ref 0–200)
HDL: 54.3 mg/dL (ref >39.00)
LDL Cholesterol: 23 mg/dL (ref 0–99)
NonHDL: 54.7
Total CHOL/HDL Ratio: 2
Triglycerides: 159 mg/dL — ABNORMAL HIGH (ref 0.0–149.0)
VLDL: 31.8 mg/dL (ref 0.0–40.0)

## 2018-01-30 LAB — COMPREHENSIVE METABOLIC PANEL
ALBUMIN: 4.4 g/dL (ref 3.5–5.2)
ALK PHOS: 41 U/L (ref 39–117)
ALT: 11 U/L (ref 0–35)
AST: 18 U/L (ref 0–37)
BILIRUBIN TOTAL: 0.8 mg/dL (ref 0.2–1.2)
BUN: 16 mg/dL (ref 6–23)
CO2: 21 mEq/L (ref 19–32)
Calcium: 9.2 mg/dL (ref 8.4–10.5)
Chloride: 104 mEq/L (ref 96–112)
Creatinine, Ser: 1.99 mg/dL — ABNORMAL HIGH (ref 0.40–1.20)
GFR: 26.61 mL/min — AB (ref >60.00)
Glucose, Bld: 84 mg/dL (ref 70–99)
POTASSIUM: 5.1 meq/L (ref 3.5–5.1)
Sodium: 133 mEq/L — ABNORMAL LOW (ref 135–145)
TOTAL PROTEIN: 7.6 g/dL (ref 6.0–8.3)

## 2018-01-31 ENCOUNTER — Encounter: Payer: Self-pay | Admitting: Internal Medicine

## 2018-01-31 DIAGNOSIS — N179 Acute kidney failure, unspecified: Secondary | ICD-10-CM | POA: Insufficient documentation

## 2018-01-31 NOTE — Assessment & Plan Note (Signed)
Secondary to DDD and scoliosis managed with vicodin and methocarbamol  .  She has not had any ER visits  And has not requested any early refills.  Her Refill history was confirmed via [place] Controlled Substance database by me today during her visit and there have been no prescriptions of controlled substances filled from any providers other than me. .Today she reports that her pain is not controlled with the low dose vicodiin and is requesting oxycodone.  I have offered an increase is the dose of vicodin  from 5 mg to 10 mg as a one month trial

## 2018-01-31 NOTE — Assessment & Plan Note (Addendum)
Well controlled on current regimen.  She has had a significant change in renal function no changes today.  Lab Results  Component Value Date   CREATININE 1.99 (H) 01/29/2018   Lab Results  Component Value Date   NA 133 (L) 01/29/2018   K 5.1 01/29/2018   CL 104 01/29/2018   CO2 21 01/29/2018

## 2018-01-31 NOTE — Assessment & Plan Note (Addendum)
Etiology unclear. Previous episode was due to use of NSAIDS.  She is s/p nephrectomy for history of Wilms tumor.  Referral to nephrology;  Suspend alendronate.

## 2018-03-03 ENCOUNTER — Other Ambulatory Visit: Payer: Self-pay | Admitting: Internal Medicine

## 2018-03-11 ENCOUNTER — Other Ambulatory Visit: Payer: Self-pay | Admitting: Internal Medicine

## 2018-03-11 MED ORDER — ALBUTEROL SULFATE HFA 108 (90 BASE) MCG/ACT IN AERS: 2.0000 | INHALATION_SPRAY | Freq: Four times a day (QID) | RESPIRATORY_TRACT | 2 refills | Status: DC | PRN

## 2018-03-11 MED ORDER — HYDROCODONE-ACETAMINOPHEN 10-325 MG PO TABS: 1.0000 | ORAL_TABLET | Freq: Four times a day (QID) | ORAL | 0 refills | Status: DC | PRN

## 2018-03-11 NOTE — Telephone Encounter (Signed)
Spoke with pt and informed her that we have her rxs ready to be picked up. Also schedueld the pt for a follow up appt with Dr. Derrel Nip in July. Pt is aware of appt date and time.

## 2018-03-11 NOTE — Telephone Encounter (Signed)
VICODIN REFILLED FOR 2 MONTHS, PLEASE SET UP OV IN July

## 2018-03-11 NOTE — Telephone Encounter (Signed)
Refilled: 01/29/2018 Last OV: 01/29/2018 Next OV: not scheduled

## 2018-03-12 ENCOUNTER — Telehealth: Payer: Self-pay | Admitting: Internal Medicine

## 2018-03-12 DIAGNOSIS — N183 Chronic kidney disease, stage 3 unspecified: Secondary | ICD-10-CM

## 2018-03-12 NOTE — Telephone Encounter (Signed)
YES, ORDERED

## 2018-03-12 NOTE — Telephone Encounter (Signed)
Last CMP was done in March 2019. Is it okay to draw again?

## 2018-03-12 NOTE — Telephone Encounter (Signed)
Pt wanted to know if she could have a kidney function lab before she goes to the kidney doctor on 5/17 she stated that she wanted to compare the to labs from there and our office

## 2018-03-23 ENCOUNTER — Encounter: Payer: Self-pay | Admitting: Internal Medicine

## 2018-03-24 ENCOUNTER — Other Ambulatory Visit (INDEPENDENT_AMBULATORY_CARE_PROVIDER_SITE_OTHER): Payer: PPO

## 2018-03-24 DIAGNOSIS — N183 Chronic kidney disease, stage 3 unspecified: Secondary | ICD-10-CM

## 2018-03-24 LAB — BASIC METABOLIC PANEL
BUN: 16 mg/dL (ref 6–23)
CALCIUM: 9.6 mg/dL (ref 8.4–10.5)
CHLORIDE: 106 meq/L (ref 96–112)
CO2: 27 meq/L (ref 19–32)
CREATININE: 1.07 mg/dL (ref 0.40–1.20)
GFR: 54.43 mL/min — ABNORMAL LOW (ref >60.00)
Glucose, Bld: 116 mg/dL — ABNORMAL HIGH (ref 70–99)
Potassium: 5.1 mEq/L (ref 3.5–5.1)
SODIUM: 141 meq/L (ref 135–145)

## 2018-03-30 ENCOUNTER — Other Ambulatory Visit: Payer: Self-pay | Admitting: Physician Assistant

## 2018-03-30 DIAGNOSIS — I25810 Atherosclerosis of coronary artery bypass graft(s) without angina pectoris: Secondary | ICD-10-CM

## 2018-04-06 ENCOUNTER — Other Ambulatory Visit: Payer: Self-pay | Admitting: Cardiovascular Disease

## 2018-04-14 ENCOUNTER — Other Ambulatory Visit: Payer: Self-pay | Admitting: Cardiovascular Disease

## 2018-04-29 ENCOUNTER — Other Ambulatory Visit: Payer: Self-pay | Admitting: Cardiovascular Disease

## 2018-05-11 ENCOUNTER — Ambulatory Visit (INDEPENDENT_AMBULATORY_CARE_PROVIDER_SITE_OTHER): Payer: PPO | Admitting: Internal Medicine

## 2018-05-11 ENCOUNTER — Encounter: Payer: Self-pay | Admitting: Internal Medicine

## 2018-05-11 VITALS — BP 142/70 | HR 62 | Temp 98.1°F | Resp 15 | Ht 59.0 in | Wt 136.2 lb

## 2018-05-11 DIAGNOSIS — E663 Overweight: Secondary | ICD-10-CM

## 2018-05-11 DIAGNOSIS — I10 Essential (primary) hypertension: Secondary | ICD-10-CM

## 2018-05-11 DIAGNOSIS — M5416 Radiculopathy, lumbar region: Secondary | ICD-10-CM

## 2018-05-11 DIAGNOSIS — Z1231 Encounter for screening mammogram for malignant neoplasm of breast: Secondary | ICD-10-CM | POA: Diagnosis not present

## 2018-05-11 DIAGNOSIS — Z23 Encounter for immunization: Secondary | ICD-10-CM | POA: Diagnosis not present

## 2018-05-11 DIAGNOSIS — I70209 Unspecified atherosclerosis of native arteries of extremities, unspecified extremity: Secondary | ICD-10-CM

## 2018-05-11 DIAGNOSIS — Z1239 Encounter for other screening for malignant neoplasm of breast: Secondary | ICD-10-CM

## 2018-05-11 MED ORDER — HYDROCODONE-ACETAMINOPHEN 10-325 MG PO TABS: 1.0000 | ORAL_TABLET | Freq: Four times a day (QID) | ORAL | 0 refills | Status: DC | PRN

## 2018-05-11 NOTE — Progress Notes (Signed)
Subjective:  Patient ID: Suzanne Cole, female    DOB: 02-18-1951  Age: 67 y.o. MRN: [id number]  CC: The primary encounter diagnosis was Breast cancer screening, high risk patient. Diagnoses of Need for vaccination with 13-polyvalent pneumococcal conjugate vaccine, Lumbar radiculitis, Essential hypertension, Atherosclerotic peripheral vascular disease ([place]), and Overweight (BMI 25.0-29.9) were also pertinent to this visit.  HPI Cay A Cifelli presents for follow up on hypertension, CKD , osteoporosis  prevention and  chronc opioid use for pain management of spinal stenosis.    last refill of Vicodin Jun 1 #120. Refill history confirmed via [place] Controlled Substance database, accessed by me today..   Hypertension: patient checks blood pressure twice weekly at home.  Readings have been for the most part < 140/80 at rest . Patient is following a reduce salt diet most days and is taking medications as prescribed.  Her bp is elevated today because she remains very agitated by her dog's near drwoning that occurred yesterday   Overweight: she has lost 7 lbs since last visit taking care of 4 nieces who are in foster care   Using a Topical analgesic Real Time Pain Relief   On right arm and shoulder mostly at night  Prior surgery on right shoulder .  prior left ankle sprain.  ,  And lower back pain  Also using OsteoBiflex which is helping smaller joints .  Renal function much better last month .  meloxicam and alendronate stopped    Outpatient Medications Prior to Visit  Medication Sig Dispense Refill  . albuterol (PROVENTIL HFA;VENTOLIN HFA) 108 (90 Base) MCG/ACT inhaler Inhale 2 puffs into the lungs every 6 (six) hours as needed for wheezing or shortness of breath. FILL WITH PROAIR PER INSURANCE 1 Inhaler 2  . amLODipine (NORVASC) 10 MG tablet TAKE 1 TABLET BY MOUTH ONCE DAILY FOR HIGH BLOOD PRESSURE 90 tablet 1  . aspirin 81 MG tablet Take 81 mg by mouth as needed for pain.    . carvedilol (COREG)  12.5 MG tablet TAKE 1 TABLET BY MOUTH TWICE DAILY 60 tablet 0  . cetirizine (ZYRTEC) 10 MG tablet Take 10 mg by mouth daily.    . Cholecalciferol (VITAMIN D3) 1000 units CHEW Chew 1,000 mcg by mouth daily.    . clopidogrel (PLAVIX) 75 MG tablet TAKE 1 TABLET BY MOUTH ONCE DAILY 90 tablet 3  . gabapentin (NEURONTIN) 300 MG capsule Take 1 capsule (300 mg total) by mouth 3 (three) times daily. 270 capsule 1  . methocarbamol (ROBAXIN) 750 MG tablet take 1 tablet by mouth four times a day 120 tablet 1  . methocarbamol (ROBAXIN) 750 MG tablet TAKE 1 TABLET(750 MG) BY MOUTH FOUR TIMES DAILY 360 tablet 1  . Omega-3 Fatty Acids (FISH OIL) 1000 MG CAPS Take 3 capsules (3,000 mg total) by mouth daily. 90 capsule 0  . pantoprazole (PROTONIX) 40 MG tablet TAKE 1 TABLET BY MOUTH ONCE DAILY 90 tablet 3  . ranitidine (ZANTAC) 150 MG tablet take 1 tablet by mouth twice a day 240 tablet 0  . rosuvastatin (CRESTOR) 40 MG tablet TAKE 1 TABLET BY MOUTH ONCE DAILY 30 tablet 0  . triamcinolone cream (KENALOG) 0.1 % Apply 1 application topically 2 (two) times daily. 30 g 0  . HYDROcodone-acetaminophen (NORCO) 10-325 MG tablet Take 1 tablet by mouth every 6 (six) hours as needed. 120 tablet 0   Facility-Administered Medications Prior to Visit  Medication Dose Route Frequency Provider Last Rate Last Dose  . cloNIDine (CATAPRES)  tablet 0.2 mg  0.2 mg Oral Once [name], MD        Review of Systems;  Patient denies headache, fevers, malaise, unintentional weight loss, skin rash, eye pain, sinus congestion and sinus pain, sore throat, dysphagia,  hemoptysis , cough, dyspnea, wheezing, chest pain, palpitations, orthopnea, edema, abdominal pain, nausea, melena, diarrhea, constipation, flank pain, dysuria, hematuria, urinary  Frequency, nocturia, numbness, tingling, seizures,  Focal weakness, Loss of consciousness,  Tremor, insomnia, depression, anxiety, and suicidal ideation.      Objective:  BP (!) 142/70 (BP  Location: Left Arm, Patient Position: Sitting, Cuff Size: Normal)   Pulse 62   Temp 98.1 F (36.7 C) (Oral)   Resp 15   Ht 4\' 11"  (1.499 m)   Wt 136 lb 3.2 oz (61.8 kg)   SpO2 97%   BMI 27.51 kg/m   BP Readings from Last 3 Encounters:  05/11/18 (!) 142/70  01/29/18 130/72  10/01/17 120/60    Wt Readings from Last 3 Encounters:  05/11/18 136 lb 3.2 oz (61.8 kg)  01/29/18 143 lb 9.6 oz (65.1 kg)  10/01/17 138 lb 12 oz (62.9 kg)    General appearance: alert, cooperative and appears stated age Ears: normal TM's and external ear canals both ears Throat: lips, mucosa, and tongue normal; teeth and gums normal Neck: no adenopathy, no carotid bruit, supple, symmetrical, trachea midline and thyroid not enlarged, symmetric, no tenderness/mass/nodules Back: symmetric, no curvature. ROM normal. No CVA tenderness. Lungs: clear to auscultation bilaterally Heart: regular rate and rhythm, S1, S2 normal, no murmur, click, rub or gallop Abdomen: soft, non-tender; bowel sounds normal; no masses,  no organomegaly Pulses: 2+ and symmetric Skin: Skin color, texture, turgor normal. No rashes or lesions Lymph nodes: Cervical, supraclavicular, and axillary nodes normal.  Lab Results  Component Value Date   HGBA1C 5.6 03/14/2017   HGBA1C 5.7 11/09/2014    Lab Results  Component Value Date   CREATININE 1.07 03/24/2018   CREATININE 1.99 (H) 01/29/2018   CREATININE 1.15 10/07/2017    Lab Results  Component Value Date   WBC 8.6 01/29/2018   HGB 12.0 01/29/2018   HCT 35.7 (L) 01/29/2018   PLT 316.0 01/29/2018   GLUCOSE 116 (H) 03/24/2018   CHOL 109 01/29/2018   TRIG 159.0 (H) 01/29/2018   HDL 54.30 01/29/2018   LDLDIRECT 47.0 03/14/2017   LDLCALC 23 01/29/2018   ALT 11 01/29/2018   AST 18 01/29/2018   NA 141 03/24/2018   K 5.1 03/24/2018   CL 106 03/24/2018   CREATININE 1.07 03/24/2018   BUN 16 03/24/2018   CO2 27 03/24/2018   TSH 1.29 08/03/2015   INR 0.9 11/28/2014   HGBA1C  5.6 03/14/2017   MICROALBUR <0.7 12/13/2016    Dg Neck Soft Tissue  Result Date: 06/07/2016 CLINICAL DATA:  Difficulty swallowing earlier today. Extensive poison ivy exposure. EXAM: NECK SOFT TISSUES - 1+ VIEW COMPARISON:  Cervical spine radiographs 11/25/2014 FINDINGS: The soft tissues of the neck are unremarkable. The airway is patent. The upper lungs are clear. Advanced degenerative changes are again noted in the lower cervical spine. There is some progression since prior study. Additional teeth have been extracted since the prior exam. No focal lytic or blastic lesions are present. IMPRESSION: Normal appearance of the soft tissues of the neck. Progressive spondylosis in the cervical spine. Electronically Signed   By: San Morelle M.D.   On: 06/07/2016 18:35   Assessment & Plan:   Problem List Items Addressed  This Visit    Lumbar radiculitis    Secondary to DDD and scoliosis managed with vicodin and methocarbamol  .  She has not had any ER visits  And has not requested any early refills.  Her Refill history was confirmed via [place] Controlled Substance database by me today during her visit and there have been no prescriptions of controlled substances filled from any providers other than me. .Her pain is better controlled on the current regimen.  Refills given for 3 months.       Hypertension    Well controlled on current regimen. Renal function stable, no changes today.  Lab Results  Component Value Date   CREATININE 1.07 03/24/2018         Atherosclerotic peripheral vascular disease ([place])    Carotid artery dopplers in 2016 noted <40% on right,  < 60% on left . She has not had repeat imaging .   She is taking aspirin and crestor daily.       Overweight (BMI 25.0-29.9)    I have congratulated her in reduction of   BMI and encouraged  Continued weight loss with goal of 10% of body weigh over the next 6 months using a low glycemic index diet and regular exercise a minimum of 5 days  per week.         Other Visit Diagnoses    Breast cancer screening, high risk patient    -  Primary   Relevant Orders   MM DIGITAL SCREENING BILATERAL   Need for vaccination with 13-polyvalent pneumococcal conjugate vaccine       Relevant Orders   Pneumococcal conjugate vaccine 13-valent IM (Completed)    A total of 25 minutes of face to face time was spent with patient more than half of which was spent in counselling about the above mentioned conditions  and coordination of care   I am having Vester A. Beadnell maintain her cetirizine, aspirin, triamcinolone cream, Fish Oil, Vitamin D3, gabapentin, ranitidine, methocarbamol, methocarbamol, amLODipine, albuterol, clopidogrel, pantoprazole, carvedilol, rosuvastatin, HYDROcodone-acetaminophen, and HYDROcodone-acetaminophen. We will continue to administer cloNIDine.  Meds ordered this encounter  Medications  . DISCONTD: HYDROcodone-acetaminophen (NORCO) 10-325 MG tablet    Sig: Take 1 tablet by mouth every 6 (six) hours as needed.    Dispense:  120 tablet    Refill:  0    MAY REFILL ON OR AFTER May 11 2018  . DISCONTD: HYDROcodone-acetaminophen (NORCO) 10-325 MG tablet    Sig: Take 1 tablet by mouth every 6 (six) hours as needed.    Dispense:  120 tablet    Refill:  0    MAY REFILL ON OR AFTER  June 09 2018  . DISCONTD: HYDROcodone-acetaminophen (NORCO) 10-325 MG tablet    Sig: Take 1 tablet by mouth every 6 (six) hours as needed.    Dispense:  120 tablet    Refill:  0    MAY REFILL ON OR AFTER  July 09 2018  . HYDROcodone-acetaminophen (NORCO) 10-325 MG tablet    Sig: Take 1 tablet by mouth every 6 (six) hours as needed.    Dispense:  120 tablet    Refill:  0    MAY REFILL ON OR AFTER  June 10 2018  . HYDROcodone-acetaminophen (NORCO) 10-325 MG tablet    Sig: Take 1 tablet by mouth every 6 (six) hours as needed.    Dispense:  120 tablet    Refill:  0    MAY REFILL ON OR AFTER  July 10 2018    Medications Discontinued  During This Encounter  Medication Reason  . HYDROcodone-acetaminophen (NORCO) 10-325 MG tablet Reorder  . HYDROcodone-acetaminophen (NORCO) 10-325 MG tablet Reorder  . HYDROcodone-acetaminophen (NORCO) 10-325 MG tablet Reorder  . HYDROcodone-acetaminophen (NORCO) 10-325 MG tablet Reorder    Follow-up: Return in about 3 months (around 08/11/2018) for CPE.   [name], MD

## 2018-05-11 NOTE — Patient Instructions (Addendum)
I'm glad your puppy is still with you!!   Keep an eye on your blood pressure and let me know if it starts to stay above 130/80   I  have ordered your mammogram,  which is due in August at [facility]    See you in 3 months for your Wellness exam

## 2018-05-13 DIAGNOSIS — E663 Overweight: Secondary | ICD-10-CM | POA: Insufficient documentation

## 2018-05-13 DIAGNOSIS — E669 Obesity, unspecified: Secondary | ICD-10-CM | POA: Insufficient documentation

## 2018-05-13 NOTE — Assessment & Plan Note (Addendum)
Carotid artery dopplers in 2016 noted <40% on right,  < 60% on left . She has not had repeat imaging .   She is taking aspirin and crestor daily.

## 2018-05-13 NOTE — Assessment & Plan Note (Signed)
Secondary to DDD and scoliosis managed with vicodin and methocarbamol  .  She has not had any ER visits  And has not requested any early refills.  Her Refill history was confirmed via [place] Controlled Substance database by me today during her visit and there have been no prescriptions of controlled substances filled from any providers other than me. .Her pain is better controlled on the current regimen.  Refills given for 3 months.

## 2018-05-13 NOTE — Assessment & Plan Note (Signed)
I have congratulated her in reduction of   BMI and encouraged  Continued weight loss with goal of 10% of body weigh over the next 6 months using a low glycemic index diet and regular exercise a minimum of 5 days per week.    

## 2018-05-13 NOTE — Assessment & Plan Note (Signed)
Well controlled on current regimen. Renal function stable, no changes today.  Lab Results  Component Value Date   CREATININE 1.07 03/24/2018

## 2018-06-11 ENCOUNTER — Other Ambulatory Visit: Payer: Self-pay

## 2018-06-11 MED ORDER — GABAPENTIN 300 MG PO CAPS
300.0000 mg | ORAL_CAPSULE | Freq: Three times a day (TID) | ORAL | 1 refills | Status: DC
Start: 2018-06-11 — End: 2019-01-29

## 2018-06-11 MED ORDER — AMLODIPINE BESYLATE 10 MG PO TABS: 10.0000 mg | ORAL_TABLET | Freq: Every day | ORAL | 1 refills | Status: DC

## 2018-06-24 ENCOUNTER — Other Ambulatory Visit: Payer: Self-pay

## 2018-06-24 MED ORDER — RANITIDINE HCL 150 MG PO TABS: ORAL_TABLET | ORAL | 1 refills | Status: DC

## 2018-07-15 ENCOUNTER — Other Ambulatory Visit: Payer: Self-pay | Admitting: Cardiovascular Disease

## 2018-07-22 ENCOUNTER — Other Ambulatory Visit: Payer: Self-pay | Admitting: Cardiovascular Disease

## 2018-07-27 ENCOUNTER — Other Ambulatory Visit: Payer: Self-pay | Admitting: Internal Medicine

## 2018-08-03 ENCOUNTER — Other Ambulatory Visit: Payer: Self-pay | Admitting: Cardiovascular Disease

## 2018-08-04 ENCOUNTER — Other Ambulatory Visit: Payer: Self-pay

## 2018-08-04 MED ORDER — ROSUVASTATIN CALCIUM 40 MG PO TABS: 40.0000 mg | ORAL_TABLET | Freq: Every day | ORAL | 1 refills | Status: DC

## 2018-08-05 ENCOUNTER — Other Ambulatory Visit: Payer: Self-pay | Admitting: Internal Medicine

## 2018-08-12 ENCOUNTER — Ambulatory Visit (INDEPENDENT_AMBULATORY_CARE_PROVIDER_SITE_OTHER): Payer: PPO | Admitting: Internal Medicine

## 2018-08-12 ENCOUNTER — Encounter: Payer: Self-pay | Admitting: Internal Medicine

## 2018-08-12 ENCOUNTER — Ambulatory Visit (INDEPENDENT_AMBULATORY_CARE_PROVIDER_SITE_OTHER): Payer: PPO

## 2018-08-12 VITALS — BP 150/72 | HR 61 | Temp 98.0°F | Resp 15 | Ht 59.0 in | Wt 132.6 lb

## 2018-08-12 DIAGNOSIS — E785 Hyperlipidemia, unspecified: Secondary | ICD-10-CM | POA: Diagnosis not present

## 2018-08-12 DIAGNOSIS — R112 Nausea with vomiting, unspecified: Secondary | ICD-10-CM | POA: Diagnosis not present

## 2018-08-12 DIAGNOSIS — R1114 Bilious vomiting: Secondary | ICD-10-CM

## 2018-08-12 DIAGNOSIS — R109 Unspecified abdominal pain: Secondary | ICD-10-CM | POA: Diagnosis not present

## 2018-08-12 DIAGNOSIS — M5416 Radiculopathy, lumbar region: Secondary | ICD-10-CM

## 2018-08-12 DIAGNOSIS — R110 Nausea: Secondary | ICD-10-CM | POA: Diagnosis not present

## 2018-08-12 LAB — COMPREHENSIVE METABOLIC PANEL
ALBUMIN: 4.4 g/dL (ref 3.5–5.2)
ALT: 11 U/L (ref 0–35)
AST: 17 U/L (ref 0–37)
Alkaline Phosphatase: 47 U/L (ref 39–117)
BILIRUBIN TOTAL: 0.9 mg/dL (ref 0.2–1.2)
BUN: 16 mg/dL (ref 6–23)
CALCIUM: 9.5 mg/dL (ref 8.4–10.5)
CHLORIDE: 105 meq/L (ref 96–112)
CO2: 26 mEq/L (ref 19–32)
CREATININE: 1 mg/dL (ref 0.40–1.20)
GFR: 58.78 mL/min — ABNORMAL LOW (ref >60.00)
GLUCOSE: 107 mg/dL — AB (ref 70–99)
Potassium: 4.6 mEq/L (ref 3.5–5.1)
Sodium: 137 mEq/L (ref 135–145)
Total Protein: 7.7 g/dL (ref 6.0–8.3)

## 2018-08-12 LAB — LIPID PANEL
CHOL/HDL RATIO: 3
Cholesterol: 123 mg/dL (ref 0–200)
HDL: 46.9 mg/dL (ref >39.00)
LDL CALC: 39 mg/dL (ref 0–99)
NONHDL: 75.77
Triglycerides: 184 mg/dL — ABNORMAL HIGH (ref 0.0–149.0)
VLDL: 36.8 mg/dL (ref 0.0–40.0)

## 2018-08-12 LAB — MAGNESIUM: MAGNESIUM: 2.1 mg/dL (ref 1.5–2.5)

## 2018-08-12 MED ORDER — HYDROCODONE-ACETAMINOPHEN 10-325 MG PO TABS: 1.0000 | ORAL_TABLET | Freq: Four times a day (QID) | ORAL | 0 refills | Status: DC | PRN

## 2018-08-12 NOTE — Progress Notes (Signed)
Subjective:  Patient ID: Suzanne Cole, female    DOB: 1951-06-09  Age: 67 y.o. MRN: [id number]  CC: The primary encounter diagnosis was Non-intractable vomiting with nausea, unspecified vomiting type. Diagnoses of Hyperlipidemia LDL goal <70, Lumbar radiculitis, and Bilious vomiting with nausea were also pertinent to this visit.  HPI [name] presents  For CPE ,  But deferring CPE due to sudden onset of recurrent vomiting which started this mornig Lab Results  Component Value Date   HGBA1C 5.6 03/14/2017    She has a history of self  Limited nausea and vomiting that has been occurring periodically since age 21.  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  The symptoms begin  with increased gaseous distension,  Periumbilical abd pain.  Recurrent burping and gas, followed by  Vomiting yellow bilious liquid .  The emesis usually recurs repeatedly during a period  lasting 12 hours , and resolves if she avoids eating.  She is able to remain hydrated during episodes  .  Avoids using anti emetics bc it makes it worse.  .    Current episode was preceded by diarrhea  That lasted one week .  She had  iquid stools once or twice daily,  Husband had also been ill with same symptoms.  Her stool today was semi formed.  Feeling flushed,    2) Chronic back and hip pain managed with opioids.   She has not had any ER visits  And has not requested any early refills.  Her Refill history was confirmed via [place] Controlled Substance database by me today during her visit and there have been no prescriptions of controlled substances filled from any providers other than me. . Refills on vicodin for oct nov and dec    Outpatient Medications Prior to Visit  Medication Sig Dispense Refill  . albuterol (PROVENTIL HFA;VENTOLIN HFA) 108 (90 Base) MCG/ACT inhaler Inhale 2 puffs into the lungs every 6 (six)  hours as needed for wheezing or shortness of breath. FILL WITH PROAIR PER INSURANCE 1 Inhaler 2  . amLODipine (NORVASC) 10 MG tablet Take 1 tablet (10 mg total) by mouth daily. for high blood pressure 90 tablet 1  . aspirin 81 MG tablet Take 81 mg by mouth as needed for pain.    . carvedilol (COREG) 12.5 MG tablet TAKE 1 TABLET BY MOUTH TWICE DAILY 180 tablet 1  . cetirizine (ZYRTEC) 10 MG tablet Take 10 mg by mouth daily.    . Cholecalciferol (VITAMIN D3) 1000 units CHEW Chew 1,000 mcg by mouth daily.    . clopidogrel (PLAVIX) 75 MG tablet TAKE 1 TABLET BY MOUTH ONCE DAILY 90 tablet 3  . gabapentin (NEURONTIN) 300 MG capsule Take 1 capsule (300 mg total) by mouth 3 (three) times daily. 270 capsule 1  . methocarbamol (ROBAXIN) 750 MG tablet take 1 tablet by mouth four times a day 120 tablet 1  . methocarbamol (ROBAXIN) 750 MG tablet TAKE 1 TABLET(750 MG) BY MOUTH FOUR TIMES DAILY 360 tablet 1  . Omega-3 Fatty Acids (FISH OIL) 1000 MG CAPS Take 3 capsules (3,000 mg total) by mouth daily. 90 capsule 0  . pantoprazole (PROTONIX) 40 MG tablet TAKE 1 TABLET BY MOUTH ONCE DAILY 90 tablet 3  . ranitidine (ZANTAC) 150 MG tablet take 1 tablet by mouth twice a day 180 tablet 1  . rosuvastatin (CRESTOR) 40 MG tablet  Take 1 tablet (40 mg total) by mouth daily. 90 tablet 1  . triamcinolone cream (KENALOG) 0.1 % Apply 1 application topically 2 (two) times daily. 30 g 0  . HYDROcodone-acetaminophen (NORCO) 10-325 MG tablet Take 1 tablet by mouth every 6 (six) hours as needed. 120 tablet 0  . HYDROcodone-acetaminophen (NORCO) 10-325 MG tablet Take 1 tablet by mouth every 6 (six) hours as needed. 120 tablet 0   Facility-Administered Medications Prior to Visit  Medication Dose Route Frequency Provider Last Rate Last Dose  . cloNIDine (CATAPRES) tablet 0.2 mg  0.2 mg Oral Once [name], MD        Review of Systems;  Patient denies headache, fevers, malaise, unintentional weight loss, skin rash, eye  pain, sinus congestion and sinus pain, sore throat, dysphagia,  hemoptysis , cough, dyspnea, wheezing, chest pain, palpitations, orthopnea, edema, abdominal pain, nausea, melena, diarrhea, constipation, flank pain, dysuria, hematuria, urinary  Frequency, nocturia, numbness, tingling, seizures,  Focal weakness, Loss of consciousness,  Tremor, insomnia, depression, anxiety, and suicidal ideation.      Objective:  BP (!) 150/72 (BP Location: Left Arm, Patient Position: Sitting, Cuff Size: Normal)   Pulse 61   Temp 98 F (36.7 C) (Oral)   Resp 15   Ht 4\' 11"  (1.499 m)   Wt 132 lb 9.6 oz (60.1 kg)   SpO2 98%   BMI 26.78 kg/m   BP Readings from Last 3 Encounters:  08/12/18 (!) 150/72  05/11/18 (!) 142/70  01/29/18 130/72    Wt Readings from Last 3 Encounters:  08/12/18 132 lb 9.6 oz (60.1 kg)  05/11/18 136 lb 3.2 oz (61.8 kg)  01/29/18 143 lb 9.6 oz (65.1 kg)    General appearance: alert, cooperative and appears stated age Ears: normal TM's and external ear canals both ears Throat: lips, mucosa, and tongue normal; teeth and gums normal Neck: no adenopathy, no carotid bruit, supple, symmetrical, trachea midline and thyroid not enlarged, symmetric, no tenderness/mass/nodules Back: symmetric, no curvature. ROM normal. No CVA tenderness. Lungs: clear to auscultation bilaterally Heart: regular rate and rhythm, S1, S2 normal, no murmur, click, rub or gallop Abdomen: soft, non-tender; bowel sounds normal; no masses,  no organomegaly Pulses: 2+ and symmetric Skin: Skin color, texture, turgor normal. No rashes or lesions Lymph nodes: Cervical, supraclavicular, and axillary nodes normal.  Lab Results  Component Value Date   HGBA1C 5.6 03/14/2017   HGBA1C 5.7 11/09/2014    Lab Results  Component Value Date   CREATININE 1.00 08/12/2018   CREATININE 1.07 03/24/2018   CREATININE 1.99 (H) 01/29/2018    Lab Results  Component Value Date   WBC 8.6 01/29/2018   HGB 12.0 01/29/2018    HCT 35.7 (L) 01/29/2018   PLT 316.0 01/29/2018   GLUCOSE 107 (H) 08/12/2018   CHOL 123 08/12/2018   TRIG 184.0 (H) 08/12/2018   HDL 46.90 08/12/2018   LDLDIRECT 47.0 03/14/2017   LDLCALC 39 08/12/2018   ALT 11 08/12/2018   AST 17 08/12/2018   NA 137 08/12/2018   K 4.6 08/12/2018   CL 105 08/12/2018   CREATININE 1.00 08/12/2018   BUN 16 08/12/2018   CO2 26 08/12/2018   TSH 1.29 08/03/2015   INR 0.9 11/28/2014   HGBA1C 5.6 03/14/2017   MICROALBUR <0.7 12/13/2016    Dg Neck Soft Tissue  Result Date: 06/07/2016 CLINICAL DATA:  Difficulty swallowing earlier today. Extensive poison ivy exposure. EXAM: NECK SOFT TISSUES - 1+ VIEW COMPARISON:  Cervical spine radiographs 11/25/2014  FINDINGS: The soft tissues of the neck are unremarkable. The airway is patent. The upper lungs are clear. Advanced degenerative changes are again noted in the lower cervical spine. There is some progression since prior study. Additional teeth have been extracted since the prior exam. No focal lytic or blastic lesions are present. IMPRESSION: Normal appearance of the soft tissues of the neck. Progressive spondylosis in the cervical spine. Electronically Signed   By: San Morelle M.D.   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  Suspect partial  Given history of abdominal surgery  but plain films today indicate ileus .  Continue NPO  Except for ice chips and water to maintain hydration. Renal function and lytes are normal  Lab Results  Component Value Date   CREATININE 1.00 08/12/2018      Lab Results  Component Value Date   NA 137 08/12/2018   K 4.6 08/12/2018   CL 105 08/12/2018   CO2 26 08/12/2018         Hyperlipidemia LDL goal <70   Relevant Orders   Lipid panel (Completed)   Lumbar radiculitis    Secondary to DDD and scoliosis managed with vicodin and  methocarbamol  .  She has not had any ER visits  And has not requested any early refills.  Her Refill history was confirmed via [place] Controlled Substance database by me today during her visit and there have been no prescriptions of controlled substances filled from any providers other than me. .Her pain is better controlled on the current regimen.  Refills given for 3 months.          I am having Yulonda A. Doro maintain her cetirizine, aspirin, triamcinolone cream, Fish Oil, Vitamin D3, methocarbamol, albuterol, clopidogrel, pantoprazole, amLODipine, gabapentin, ranitidine, methocarbamol, rosuvastatin, carvedilol, HYDROcodone-acetaminophen, HYDROcodone-acetaminophen, HYDROcodone-acetaminophen, and HYDROcodone-acetaminophen. We will continue to administer cloNIDine.  Meds ordered this encounter  Medications  . HYDROcodone-acetaminophen (NORCO) 10-325 MG tablet    Sig: Take 1 tablet by mouth every 6 (six) hours as needed.    Dispense:  120 tablet    Refill:  0  . HYDROcodone-acetaminophen (NORCO) 10-325 MG tablet    Sig: Take 1 tablet by mouth every 6 (six) hours as needed.    Dispense:  120 tablet    Refill:  0    MAY REFILL ON OR AFTER Sep 11 2018  . HYDROcodone-acetaminophen (NORCO) 10-325 MG tablet    Sig: Take 1 tablet by mouth every 6 (six) hours as needed.    Dispense:  120 tablet    Refill:  0    MAY REFILL ON OR AFTER  Sep 11 2018  . HYDROcodone-acetaminophen (NORCO) 10-325 MG tablet    Sig: Take 1 tablet by mouth every 6 (six) hours as needed.    Dispense:  120 tablet    Refill:  0    MAY REFILL ON OR AFTER  Oct 11 2018    Medications Discontinued During This Encounter  Medication Reason  . HYDROcodone-acetaminophen (NORCO) 10-325 MG tablet Reorder  . HYDROcodone-acetaminophen (NORCO) 10-325 MG tablet     Follow-up: No follow-ups on file.   [name], MD

## 2018-08-12 NOTE — Patient Instructions (Signed)
WE WILL RESCHEDULE YOUR CPE WHEN YOU ARE FEELING BETTER    I  AM CHECKING YOUR ELECTROLYTES  AND OTHER LABS TODAY

## 2018-08-15 DIAGNOSIS — R1114 Bilious vomiting: Secondary | ICD-10-CM | POA: Insufficient documentation

## 2018-08-15 NOTE — Assessment & Plan Note (Addendum)
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  Suspect partial  Given history of abdominal surgery  but plain films today indicate ileus .  Continue NPO  Except for ice chips and water to maintain hydration. Renal function and lytes are normal  Lab Results  Component Value Date   CREATININE 1.00 08/12/2018      Lab Results  Component Value Date   NA 137 08/12/2018   K 4.6 08/12/2018   CL 105 08/12/2018   CO2 26 08/12/2018

## 2018-08-15 NOTE — Assessment & Plan Note (Signed)
Secondary to DDD and scoliosis managed with vicodin and methocarbamol  .  She has not had any ER visits  And has not requested any early refills.  Her Refill history was confirmed via [place] Controlled Substance database by me today during her visit and there have been no prescriptions of controlled substances filled from any providers other than me. .Her pain is better controlled on the current regimen.  Refills given for 3 months.

## 2018-08-19 DIAGNOSIS — Z1231 Encounter for screening mammogram for malignant neoplasm of breast: Secondary | ICD-10-CM | POA: Diagnosis not present

## 2018-08-19 LAB — HM MAMMOGRAPHY

## 2018-09-02 DIAGNOSIS — N390 Urinary tract infection, site not specified: Secondary | ICD-10-CM | POA: Diagnosis not present

## 2018-09-02 DIAGNOSIS — R300 Dysuria: Secondary | ICD-10-CM | POA: Diagnosis not present

## 2018-09-11 ENCOUNTER — Telehealth: Payer: Self-pay | Admitting: Internal Medicine

## 2018-09-11 NOTE — Telephone Encounter (Signed)
Pt dropped off a jury duty letter. Pt is requesting a letter from Dr. Derrel Nip being excused from jury duty. Paper is up front in color folder.

## 2018-09-11 NOTE — Telephone Encounter (Signed)
Denied. MyChart message sent   

## 2018-09-11 NOTE — Telephone Encounter (Signed)
Form has been placed in red folder.  

## 2018-09-14 ENCOUNTER — Telehealth: Payer: Self-pay | Admitting: Internal Medicine

## 2018-09-14 NOTE — Telephone Encounter (Signed)
Spoke with pt to let her know that she has refills at the pharmacy. Pt gave a verbal understanding and stated that she would let us know if they give her any trouble to get refilled.
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
## 2018-09-14 NOTE — Telephone Encounter (Signed)
Copied from [place] [id number]. Topic: General - Other >> Sep 14, 2018  9:52 AM Lennox Solders wrote: Reason for CRM:pt needs new rx hydrocodone. [facility] in [place] [address]

## 2018-09-15 ENCOUNTER — Ambulatory Visit (INDEPENDENT_AMBULATORY_CARE_PROVIDER_SITE_OTHER): Payer: PPO | Admitting: Physician Assistant

## 2018-09-15 ENCOUNTER — Encounter: Payer: Self-pay | Admitting: Physician Assistant

## 2018-09-15 VITALS — BP 120/68 | HR 59 | Ht 59.0 in | Wt 132.2 lb

## 2018-09-15 DIAGNOSIS — I10 Essential (primary) hypertension: Secondary | ICD-10-CM

## 2018-09-15 DIAGNOSIS — R0683 Snoring: Secondary | ICD-10-CM | POA: Diagnosis not present

## 2018-09-15 DIAGNOSIS — E782 Mixed hyperlipidemia: Secondary | ICD-10-CM

## 2018-09-15 DIAGNOSIS — I2510 Atherosclerotic heart disease of native coronary artery without angina pectoris: Secondary | ICD-10-CM | POA: Diagnosis not present

## 2018-09-15 NOTE — Patient Instructions (Signed)
Medication Instructions:  Your physician recommends that you continue on your current medications as directed. Please refer to the Current Medication list given to you today.  If you need a refill on your cardiac medications before your next appointment, please call your pharmacy.   Lab work: none If you have labs (blood work) drawn today and your tests are completely normal, you will receive your results only by: [name] MyChart Message (if you have MyChart) OR . A paper copy in the mail If you have any lab test that is abnormal or we need to change your treatment, we will call you to review the results.  Testing/Procedures: none  Follow-Up: You have been referred to Pulmonology for sleep study consult.    At [facility], you and your health needs are our priority.  As part of our continuing mission to provide you with exceptional heart care, we have created designated Provider Care Teams.  These Care Teams include your primary Cardiologist (physician) and Advanced Practice Providers (APPs -  Physician Assistants and Nurse Practitioners) who all work together to provide you with the care you need, when you need it. You will need a follow up appointment in 12 months.  Please call our office 2 months in advance to schedule this appointment.  You may see DR Kathlyn [place] or one of the following Advanced Practice Providers on your designated Care Team:   Murray Hodgkins, NP Christell Faith, PA-C . Marrianne Mood, PA-C

## 2018-09-16 ENCOUNTER — Encounter: Payer: Self-pay | Admitting: Pulmonary Disease

## 2018-09-16 ENCOUNTER — Ambulatory Visit (INDEPENDENT_AMBULATORY_CARE_PROVIDER_SITE_OTHER): Payer: PPO | Admitting: Pulmonary Disease

## 2018-09-16 VITALS — BP 110/64 | HR 64 | Ht 59.0 in | Wt 131.0 lb

## 2018-09-16 DIAGNOSIS — R0683 Snoring: Secondary | ICD-10-CM

## 2018-09-16 DIAGNOSIS — E669 Obesity, unspecified: Secondary | ICD-10-CM | POA: Diagnosis not present

## 2018-09-16 DIAGNOSIS — R400 Somnolence: Secondary | ICD-10-CM

## 2018-09-16 DIAGNOSIS — J310 Chronic rhinitis: Secondary | ICD-10-CM

## 2018-09-16 NOTE — Patient Instructions (Signed)
We will hold off on performing sleep study for now I recommended modest weight loss of 5 to 10 pounds I recommended more consistent use of Flonase nasal inhaler Follow-up in this office as needed
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
## 2018-10-14 MED ORDER — HYDROCODONE-ACETAMINOPHEN 10-325 MG PO TABS: 1.0000 | ORAL_TABLET | Freq: Four times a day (QID) | ORAL | 0 refills | Status: DC | PRN

## 2018-10-14 NOTE — Telephone Encounter (Signed)
Refilled: 08/12/2018 Last OV: 08/12/2018 Next OV: not scheduled

## 2018-10-31 DIAGNOSIS — R0781 Pleurodynia: Secondary | ICD-10-CM | POA: Diagnosis not present

## 2018-11-30 ENCOUNTER — Ambulatory Visit: Payer: Self-pay | Admitting: Internal Medicine

## 2018-12-11 ENCOUNTER — Other Ambulatory Visit: Payer: Self-pay | Admitting: Internal Medicine

## 2018-12-14 ENCOUNTER — Telehealth: Payer: Self-pay | Admitting: Internal Medicine

## 2018-12-14 NOTE — Telephone Encounter (Signed)
Copied from [place] [id number]. Topic: General - Other >> Dec 14, 2018 11:53 AM Lennox Solders wrote: Reason for CRM:PT is calling has an appt on 12-22-2018 with dr Derrel Nip. Pt needs refill on hydrocodone. [facility] [address]. Pt is due for refill today

## 2018-12-16 NOTE — Telephone Encounter (Signed)
Patient is checking on the status of her refill of the Hydrocodone.  She is completely out of the medication.

## 2018-12-17 MED ORDER — HYDROCODONE-ACETAMINOPHEN 10-325 MG PO TABS: 1.0000 | ORAL_TABLET | Freq: Four times a day (QID) | ORAL | 0 refills | Status: DC | PRN

## 2018-12-17 NOTE — Telephone Encounter (Signed)
Why did it take 3-4 days for this message to be routed to me?  .  Can you look into it ?   Med has been refilled  And MyChart message sent

## 2018-12-17 NOTE — Telephone Encounter (Signed)
Patient would like a follow up in regards to this. Please advise.   [phone]

## 2018-12-22 ENCOUNTER — Ambulatory Visit (INDEPENDENT_AMBULATORY_CARE_PROVIDER_SITE_OTHER): Payer: PPO | Admitting: Internal Medicine

## 2018-12-22 ENCOUNTER — Encounter: Payer: Self-pay | Admitting: Internal Medicine

## 2018-12-22 VITALS — BP 130/70 | HR 60 | Temp 98.0°F | Wt 142.4 lb

## 2018-12-22 DIAGNOSIS — K222 Esophageal obstruction: Secondary | ICD-10-CM

## 2018-12-22 DIAGNOSIS — I10 Essential (primary) hypertension: Secondary | ICD-10-CM | POA: Diagnosis not present

## 2018-12-22 DIAGNOSIS — M5416 Radiculopathy, lumbar region: Secondary | ICD-10-CM

## 2018-12-22 DIAGNOSIS — E785 Hyperlipidemia, unspecified: Secondary | ICD-10-CM | POA: Diagnosis not present

## 2018-12-22 DIAGNOSIS — K219 Gastro-esophageal reflux disease without esophagitis: Secondary | ICD-10-CM | POA: Insufficient documentation

## 2018-12-22 MED ORDER — HYDROCODONE-ACETAMINOPHEN 10-325 MG PO TABS: 1.0000 | ORAL_TABLET | Freq: Four times a day (QID) | ORAL | 0 refills | Status: DC | PRN

## 2018-12-22 NOTE — Assessment & Plan Note (Signed)
Secondary to DDD and scoliosis managed with vicodin and methocarbamol  .  She has not had any ER visits  And has not requested any early refills.  Her Refill history was confirmed via [place] Controlled Substance database by me today during her visit and there have been no prescriptions of controlled substances filled from any providers other than me. .Her pain is better controlled on the current regimen.  Refills given for 3 months.

## 2018-12-22 NOTE — Patient Instructions (Signed)
I have refilled your hydrocodone for the months of March,  April and May  Please schedule a fasting lab appt prior to your next visit,  In late May
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
## 2018-12-22 NOTE — Assessment & Plan Note (Signed)
Continue protonix once daily,  Add omeprazole in the afternoon for chronically recurrent episodes of gas attacks

## 2018-12-26 ENCOUNTER — Other Ambulatory Visit: Payer: Self-pay | Admitting: Internal Medicine

## 2019-01-29 ENCOUNTER — Other Ambulatory Visit: Payer: Self-pay | Admitting: Internal Medicine

## 2019-01-29 NOTE — Telephone Encounter (Signed)
Last OV 12/22/2018  Last refilled 06/11/2018 disp 270 with 1 refill    Next appt 03/22/2019  Sent to PCP for approval

## 2019-02-01 ENCOUNTER — Other Ambulatory Visit: Payer: Self-pay | Admitting: Internal Medicine

## 2019-02-02 ENCOUNTER — Telehealth: Payer: Self-pay

## 2019-02-02 NOTE — Telephone Encounter (Signed)
PA for Methocarbamol has been submitted on covermymeds.  

## 2019-02-03 NOTE — Telephone Encounter (Signed)
The prior authorization for Methocarbamol has been denied by insurance.

## 2019-02-03 NOTE — Telephone Encounter (Signed)
Give patient option of paying out of pocket with good rx or changing to  Covered muscle relaxer

## 2019-02-04 NOTE — Telephone Encounter (Signed)
Spoke with pt and she stated that she has been using the goodrx card to get this medication for a while now. Pt stated that she isn't sure why the pharmacy ran it through her insurance this time because they haven't in a long time.

## 2019-02-06 ENCOUNTER — Other Ambulatory Visit: Payer: Self-pay | Admitting: Cardiovascular Disease

## 2019-02-17 NOTE — Telephone Encounter (Signed)
Thanks  vanessa

## 2019-03-12 ENCOUNTER — Other Ambulatory Visit: Payer: Self-pay

## 2019-03-12 NOTE — Telephone Encounter (Signed)
Last OV 12/22/2018  Last refilled   Next OV   Sent to PCP for approval

## 2019-03-15 ENCOUNTER — Telehealth: Payer: Self-pay | Admitting: Internal Medicine

## 2019-03-15 ENCOUNTER — Other Ambulatory Visit: Payer: Self-pay

## 2019-03-15 NOTE — Telephone Encounter (Signed)
Copied from [place] [id number]. Topic: Quick Communication - See Telephone Encounter >> Mar 15, 2019  9:13 AM Loma Boston wrote: CRM for notification. See Telephone encounter for: 03/15/19.HYDROcodone-acetaminophen ([place]) 10-325 MG tablet   [id number] - [address], [place] AT [facility] [address] Shari Prows [phone] (Phone) [phone] (Fax) Please refill

## 2019-03-15 NOTE — Telephone Encounter (Signed)
Called and let the pt know her medication was sent, pt states she just received a call from pharmacy to pick up.  [email or web address]

## 2019-03-17 ENCOUNTER — Other Ambulatory Visit: Payer: Self-pay | Admitting: Internal Medicine

## 2019-03-22 ENCOUNTER — Ambulatory Visit: Payer: Self-pay | Admitting: Internal Medicine

## 2019-03-23 ENCOUNTER — Other Ambulatory Visit: Payer: Self-pay

## 2019-03-23 ENCOUNTER — Other Ambulatory Visit: Payer: Self-pay | Admitting: Cardiovascular Disease

## 2019-03-23 ENCOUNTER — Other Ambulatory Visit (INDEPENDENT_AMBULATORY_CARE_PROVIDER_SITE_OTHER): Payer: PPO

## 2019-03-23 DIAGNOSIS — I10 Essential (primary) hypertension: Secondary | ICD-10-CM | POA: Diagnosis not present

## 2019-03-23 DIAGNOSIS — E785 Hyperlipidemia, unspecified: Secondary | ICD-10-CM

## 2019-03-23 LAB — COMPREHENSIVE METABOLIC PANEL
ALT: 9 U/L (ref 0–35)
AST: 17 U/L (ref 0–37)
Albumin: 4.2 g/dL (ref 3.5–5.2)
Alkaline Phosphatase: 52 U/L (ref 39–117)
BUN: 15 mg/dL (ref 6–23)
CO2: 26 mEq/L (ref 19–32)
Calcium: 9.1 mg/dL (ref 8.4–10.5)
Chloride: 104 mEq/L (ref 96–112)
Creatinine, Ser: 0.97 mg/dL (ref 0.40–1.20)
GFR: 57.18 mL/min — ABNORMAL LOW (ref >60.00)
Glucose, Bld: 94 mg/dL (ref 70–99)
Potassium: 4.6 mEq/L (ref 3.5–5.1)
Sodium: 138 mEq/L (ref 135–145)
Total Bilirubin: 0.7 mg/dL (ref 0.2–1.2)
Total Protein: 7.1 g/dL (ref 6.0–8.3)

## 2019-03-23 LAB — LIPID PANEL
Cholesterol: 112 mg/dL (ref 0–200)
HDL: 48.1 mg/dL (ref >39.00)
LDL Cholesterol: 31 mg/dL (ref 0–99)
NonHDL: 63.44
Total CHOL/HDL Ratio: 2
Triglycerides: 160 mg/dL — ABNORMAL HIGH (ref 0.0–149.0)
VLDL: 32 mg/dL (ref 0.0–40.0)

## 2019-03-25 ENCOUNTER — Other Ambulatory Visit: Payer: Self-pay

## 2019-03-26 ENCOUNTER — Encounter: Payer: Self-pay | Admitting: Internal Medicine

## 2019-03-26 ENCOUNTER — Other Ambulatory Visit: Payer: Self-pay

## 2019-03-26 ENCOUNTER — Ambulatory Visit (INDEPENDENT_AMBULATORY_CARE_PROVIDER_SITE_OTHER): Payer: PPO | Admitting: Internal Medicine

## 2019-03-26 DIAGNOSIS — M5416 Radiculopathy, lumbar region: Secondary | ICD-10-CM | POA: Diagnosis not present

## 2019-03-26 DIAGNOSIS — I10 Essential (primary) hypertension: Secondary | ICD-10-CM

## 2019-03-26 DIAGNOSIS — J301 Allergic rhinitis due to pollen: Secondary | ICD-10-CM

## 2019-03-26 DIAGNOSIS — E785 Hyperlipidemia, unspecified: Secondary | ICD-10-CM | POA: Diagnosis not present
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
## 2019-03-28 MED ORDER — HYDROCODONE-ACETAMINOPHEN 10-325 MG PO TABS: 1.0000 | ORAL_TABLET | Freq: Four times a day (QID) | ORAL | 0 refills | Status: DC | PRN

## 2019-03-28 NOTE — Assessment & Plan Note (Signed)
Well controlled on current regimen that includes carvedilol and amlodipine  Renal function stable, no changes today. She had recurrent hyperkalemia with lisinopril trial.   Lab Results  Component Value Date   CREATININE 0.97 03/23/2019   Lab Results  Component Value Date   NA 138 03/23/2019   K 4.6 03/23/2019   CL 104 03/23/2019   CO2 26 03/23/2019

## 2019-03-28 NOTE — Assessment & Plan Note (Signed)
Continue ceterizine,  flonase and saline irrigation after working outside

## 2019-03-28 NOTE — Assessment & Plan Note (Signed)
Secondary to DDD and scoliosis managed with vicodin and methocarbamol  .  She has not had any ER visits  And has not requested any early refills.  Her Refill history was confirmed via [place] Controlled Substance database by me today during her visit and there have been no prescriptions of controlled substances filled from any providers other than me. .Her pain is better controlled on the current regimen.  Refills given for June, July and August

## 2019-03-28 NOTE — Assessment & Plan Note (Signed)
LDL is at goal of < 70 on crestor high dose for known CAD and PAD.LFTs are normal.   Lab Results  Component Value Date   CHOL 112 03/23/2019   HDL 48.10 03/23/2019   LDLCALC 31 03/23/2019   LDLDIRECT 47.0 03/14/2017   TRIG 160.0 (H) 03/23/2019   CHOLHDL 2 03/23/2019   Lab Results  Component Value Date   ALT 9 03/23/2019   AST 17 03/23/2019   ALKPHOS 52 03/23/2019   BILITOT 0.7 03/23/2019

## 2019-04-27 ENCOUNTER — Other Ambulatory Visit: Payer: Self-pay | Admitting: Physician Assistant

## 2019-04-27 DIAGNOSIS — I25810 Atherosclerosis of coronary artery bypass graft(s) without angina pectoris: Secondary | ICD-10-CM

## 2019-06-25 ENCOUNTER — Ambulatory Visit (INDEPENDENT_AMBULATORY_CARE_PROVIDER_SITE_OTHER): Payer: PPO | Admitting: Internal Medicine

## 2019-06-25 ENCOUNTER — Encounter: Payer: Self-pay | Admitting: Internal Medicine

## 2019-06-25 ENCOUNTER — Other Ambulatory Visit: Payer: Self-pay

## 2019-06-25 VITALS — BP 129/80 | HR 76 | Ht 59.0 in | Wt 130.0 lb

## 2019-06-25 DIAGNOSIS — I10 Essential (primary) hypertension: Secondary | ICD-10-CM | POA: Diagnosis not present

## 2019-06-25 DIAGNOSIS — E559 Vitamin D deficiency, unspecified: Secondary | ICD-10-CM | POA: Diagnosis not present

## 2019-06-25 DIAGNOSIS — C641 Malignant neoplasm of right kidney, except renal pelvis: Secondary | ICD-10-CM

## 2019-06-25 DIAGNOSIS — I70209 Unspecified atherosclerosis of native arteries of extremities, unspecified extremity: Secondary | ICD-10-CM | POA: Diagnosis not present

## 2019-06-25 DIAGNOSIS — E785 Hyperlipidemia, unspecified: Secondary | ICD-10-CM | POA: Diagnosis not present

## 2019-06-25 DIAGNOSIS — N184 Chronic kidney disease, stage 4 (severe): Secondary | ICD-10-CM | POA: Diagnosis not present

## 2019-06-25 DIAGNOSIS — Z955 Presence of coronary angioplasty implant and graft: Secondary | ICD-10-CM

## 2019-06-25 DIAGNOSIS — I2510 Atherosclerotic heart disease of native coronary artery without angina pectoris: Secondary | ICD-10-CM | POA: Diagnosis not present

## 2019-06-25 DIAGNOSIS — M5416 Radiculopathy, lumbar region: Secondary | ICD-10-CM

## 2019-06-25 MED ORDER — HYDROCODONE-ACETAMINOPHEN 10-325 MG PO TABS: 1.0000 | ORAL_TABLET | Freq: Four times a day (QID) | ORAL | 0 refills | Status: DC | PRN

## 2019-06-25 MED ORDER — AMLODIPINE BESYLATE 10 MG PO TABS: ORAL_TABLET | ORAL | 1 refills | Status: DC

## 2019-06-25 NOTE — Progress Notes (Signed)
Patient has no concerns.  Patient said that she has been swimming recently.  Patient said that she has lost at least 4 lbs.  Pt said that she is doing well since the bp meds have been adjusted.  Pt says that she feels good.
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
## 2019-06-27 ENCOUNTER — Encounter: Payer: Self-pay | Admitting: Internal Medicine

## 2019-06-27 DIAGNOSIS — C641 Malignant neoplasm of right kidney, except renal pelvis: Secondary | ICD-10-CM

## 2019-06-27 DIAGNOSIS — Z955 Presence of coronary angioplasty implant and graft: Secondary | ICD-10-CM | POA: Insufficient documentation

## 2019-06-27 HISTORY — DX: Malignant neoplasm of right kidney, except renal pelvis: C64.1

## 2019-06-27 NOTE — Assessment & Plan Note (Signed)
Well controlled on current regimen that includes carvedilol and amlodipine  Renal function stable, no changes today. She had recurrent hyperkalemia with lisinopril trial.   Lab Results  Component Value Date   CREATININE 0.97 03/23/2019   Lab Results  Component Value Date   NA 138 03/23/2019   K 4.6 03/23/2019   CL 104 03/23/2019   CO2 26 03/23/2019    

## 2019-06-27 NOTE — Assessment & Plan Note (Signed)
Asymptomatic. continue carvedilol, clopidogrel, rosuvastatin and aspirin

## 2019-06-27 NOTE — Assessment & Plan Note (Signed)
Secondary to DDD and scoliosis managed with vicodin and methocarbamol  .  She has not had any ER visits  And has not requested any early refills.  Her Refill history was confirmed via [place] Controlled Substance database by me today during her visit and there have been no prescriptions of controlled substances filled from any providers other than me. .Her pain is better controlled on the current regimen and she is staying active. [name]  Refills given for September,  Octobr and November

## 2019-07-15 ENCOUNTER — Other Ambulatory Visit: Payer: Self-pay

## 2019-07-15 MED ORDER — GABAPENTIN 300 MG PO CAPS: ORAL_CAPSULE | ORAL | 1 refills | Status: DC

## 2019-07-29 ENCOUNTER — Other Ambulatory Visit: Payer: Self-pay

## 2019-07-29 MED ORDER — METHOCARBAMOL 750 MG PO TABS: ORAL_TABLET | ORAL | 1 refills | Status: DC

## 2019-08-26 ENCOUNTER — Ambulatory Visit: Payer: PPO | Admitting: Nurse Practitioner

## 2019-08-29 ENCOUNTER — Other Ambulatory Visit: Payer: Self-pay | Admitting: Physician Assistant

## 2019-09-01 ENCOUNTER — Other Ambulatory Visit: Payer: Self-pay | Admitting: Internal Medicine

## 2019-09-01 ENCOUNTER — Telehealth: Payer: Self-pay

## 2019-09-01 NOTE — Telephone Encounter (Signed)
Requested medication (s) are due for refill today: no  Requested medication (s) are on the active medication list: yes  Last refill:  07/14/2019  Future visit scheduled: yes  Notes to clinic: Patient also requesting albuterol nebulizer solution    Requested Prescriptions  Pending Prescriptions Disp Refills   HYDROcodone-acetaminophen (NORCO) 10-325 MG tablet 120 tablet 0    Sig: Take 1 tablet by mouth every 6 (six) hours as needed.     Not Delegated - Analgesics:  Opioid Agonist Combinations Failed - 09/01/2019  1:12 PM      Failed - This refill cannot be delegated      Failed - Urine Drug Screen completed in last 360 days.      Passed - Valid encounter within last 6 months    Recent Outpatient Visits          2 months ago Vitamin D deficiency   [facility] [name], MD   5 months ago Hyperlipidemia LDL goal <70   [facility] [name], MD   8 months ago Essential hypertension   [facility]  [name], MD   1 year ago Non-intractable vomiting with nausea, unspecified vomiting type   [place] [name], MD   1 year ago Breast cancer screening, high risk patient   [place] [name], MD      Future Appointments            In 1 month Derrel Nip, Aris Everts, MD [facility], [facility]

## 2019-09-01 NOTE — Telephone Encounter (Signed)
Medication Refill - Medication:albuterol (PROVENTIL) (2.5 MG/3ML) 0.083% nebulizer solution [[id number]]  This has been discounted however patient needs a refill for the machine. HYDROcodone-acetaminophen ([place]) 10-325 MG tablet   Preferred Pharmacy (with phone number or street name): [id number] [address], [address]  [place] [address]  Phone: [phone] Fax: [phone]     Agent: Please be advised that RX refills may take up to 3 business days. We ask that you follow-up with your pharmacy.

## 2019-09-01 NOTE — Telephone Encounter (Signed)
Spoke with pt and she stated that she was in indirect contact with someone who tested positive for covid-19. The pt stated that she was with the person that was in direct contact with the covid positive pt last Wednesday and then she found out that the person was positive on Friday. So it has been a week the pt does not have any symptoms. Pt is wondering if she needs to be tested for Covid and if she needs to continue quarantining. Spoke with Dr. Derrel Nip verbally and she stated that the pt does not need to be tested nor does she need to remain in quarantine. Called pt back to let her know and she gave a verbal understanding.

## 2019-09-01 NOTE — Telephone Encounter (Signed)
Copied from [place] [id number]. Topic: General - Inquiry >> Sep 01, 2019  1:02 PM [name] wrote: Reason for CRM: Patient has been exposed to someone with Covid and would like to know if she should go and get tested. Patient has no symptoms.  She would like a call back stating if she should move forward with testing

## 2019-09-03 MED ORDER — HYDROCODONE-ACETAMINOPHEN 10-325 MG PO TABS: 1.0000 | ORAL_TABLET | Freq: Four times a day (QID) | ORAL | 0 refills | Status: DC | PRN

## 2019-09-03 MED ORDER — ALBUTEROL SULFATE (2.5 MG/3ML) 0.083% IN NEBU: 2.5000 mg | INHALATION_SOLUTION | Freq: Four times a day (QID) | RESPIRATORY_TRACT | 1 refills | Status: DC | PRN

## 2019-09-03 NOTE — Telephone Encounter (Signed)
Patient is requesting refill on hydrocodone.  Also requesting a refill on albuterol nebulizer solution.  The albuterol nebulizer solution is not on her med list only the albuterol inhaler.  I called patient to clarify but no answer.  Unable to leave a message since no voicemail was set up.

## 2019-09-03 NOTE — Telephone Encounter (Signed)
Both refilled  Look under "historical" when you can't find a medication that needs refill , because once it runs out,  Epic automatically d/c's it

## 2019-09-14 ENCOUNTER — Telehealth: Payer: Self-pay

## 2019-09-14 MED ORDER — PANTOPRAZOLE SODIUM 40 MG PO TBEC: 40.0000 mg | DELAYED_RELEASE_TABLET | Freq: Every day | ORAL | 0 refills | Status: DC

## 2019-09-14 NOTE — Telephone Encounter (Signed)
Requested Prescriptions   Signed Prescriptions Disp Refills  . pantoprazole (PROTONIX) 40 MG tablet 30 tablet 0    Sig: Take 1 tablet (40 mg total) by mouth daily. *NEEDS OFFICE VISIT FOR FURTHER REFILLS-PLEASE CALL [phone] TO SCHEDULE.*    Authorizing Provider: Kathlyn Sacramento A    Ordering User: Raelene Bott, Kinzley Savell L

## 2019-09-16 ENCOUNTER — Telehealth: Payer: Self-pay

## 2019-09-16 NOTE — Telephone Encounter (Signed)
Copied from [place] [id number]. Topic: Appointment Scheduling - Scheduling Inquiry for Clinic >> Sep 16, 2019  8:40 AM Burchel, Abbi R wrote: Reason for CRM: Pt needs to sched mammogram. Please call pt to advise: [phone]

## 2019-10-04 ENCOUNTER — Ambulatory Visit (INDEPENDENT_AMBULATORY_CARE_PROVIDER_SITE_OTHER): Payer: PPO | Admitting: Internal Medicine

## 2019-10-04 ENCOUNTER — Encounter: Payer: Self-pay | Admitting: Internal Medicine

## 2019-10-04 ENCOUNTER — Other Ambulatory Visit: Payer: Self-pay

## 2019-10-04 VITALS — BP 130/67 | Ht 59.0 in | Wt 130.0 lb

## 2019-10-04 DIAGNOSIS — M5416 Radiculopathy, lumbar region: Secondary | ICD-10-CM

## 2019-10-04 DIAGNOSIS — Z1231 Encounter for screening mammogram for malignant neoplasm of breast: Secondary | ICD-10-CM

## 2019-10-04 DIAGNOSIS — I2510 Atherosclerotic heart disease of native coronary artery without angina pectoris: Secondary | ICD-10-CM

## 2019-10-04 MED ORDER — HYDROCODONE-ACETAMINOPHEN 10-325 MG PO TABS: 1.0000 | ORAL_TABLET | Freq: Four times a day (QID) | ORAL | 0 refills | Status: DC | PRN
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
## 2019-10-05 NOTE — Assessment & Plan Note (Signed)
Annual screening  mammogram ordered

## 2019-10-05 NOTE — Assessment & Plan Note (Signed)
Secondary to DDD and scoliosis managed with vicodin and methocarbamol  .  She has not had any ER visits  And has not requested any early refills.  Her Refill history was confirmed via [place] Controlled Substance database by me today during her visit and there have been no prescriptions of controlled substances filled from any providers other than me. .Her pain is better controlled on the current regimen and she is staying active. [name]  Refills given for December January and February

## 2019-10-05 NOTE — Assessment & Plan Note (Signed)
Asymptomatic. continue carvedilol, clopidogrel, rosuvastatin and aspirin

## 2019-10-11 ENCOUNTER — Other Ambulatory Visit: Payer: Self-pay

## 2019-10-11 MED ORDER — PANTOPRAZOLE SODIUM 40 MG PO TBEC: 40.0000 mg | DELAYED_RELEASE_TABLET | Freq: Every day | ORAL | 0 refills | Status: DC
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
## 2019-10-25 ENCOUNTER — Other Ambulatory Visit: Payer: Self-pay

## 2019-10-25 ENCOUNTER — Ambulatory Visit (INDEPENDENT_AMBULATORY_CARE_PROVIDER_SITE_OTHER): Payer: PPO | Admitting: Family

## 2019-10-25 ENCOUNTER — Encounter: Payer: Self-pay | Admitting: Family

## 2019-10-25 VITALS — BP 140/70 | HR 67 | Ht 59.0 in | Wt 132.0 lb

## 2019-10-25 DIAGNOSIS — E782 Mixed hyperlipidemia: Secondary | ICD-10-CM | POA: Diagnosis not present

## 2019-10-25 DIAGNOSIS — I2510 Atherosclerotic heart disease of native coronary artery without angina pectoris: Secondary | ICD-10-CM | POA: Diagnosis not present

## 2019-10-25 DIAGNOSIS — K219 Gastro-esophageal reflux disease without esophagitis: Secondary | ICD-10-CM | POA: Diagnosis not present

## 2019-10-25 DIAGNOSIS — R0683 Snoring: Secondary | ICD-10-CM

## 2019-10-25 DIAGNOSIS — I10 Essential (primary) hypertension: Secondary | ICD-10-CM | POA: Diagnosis not present

## 2019-10-25 MED ORDER — PANTOPRAZOLE SODIUM 40 MG PO TBEC: 40.0000 mg | DELAYED_RELEASE_TABLET | Freq: Every day | ORAL | 2 refills | Status: DC

## 2019-10-25 NOTE — Patient Instructions (Addendum)
   Recommend continuing to check your blood pressure regularly. If you notice your blood pressure is consistently more than 130/80, please call our office.    Medication Instructions:  Your physician recommends that you continue on your current medications as directed. Please refer to the Current Medication list given to you today.  *If you need a refill on your cardiac medications before your next appointment, please call your pharmacy*  Lab Work: none If you have labs (blood work) drawn today and your tests are completely normal, you will receive your results only by: [name] MyChart Message (if you have MyChart) OR . A paper copy in the mail If you have any lab test that is abnormal or we need to change your treatment, we will call you to review the results.  Testing/Procedures: none  Follow-Up: At [facility], you and your health needs are our priority.  As part of our continuing mission to provide you with exceptional heart care, we have created designated Provider Care Teams.  These Care Teams include your primary Cardiologist (physician) and Advanced Practice Providers (APPs -  Physician Assistants and Nurse Practitioners) who all work together to provide you with the care you need, when you need it.  Your next appointment:   12 month(s)  The format for your next appointment:   In Person  Provider:    You may see Kathlyn Sacramento, MD or one of the following Advanced Practice Providers on your designated Care Team:    Murray Hodgkins, NP  Christell Faith, PA-C  Marrianne Mood, PA-C

## 2019-10-26 DIAGNOSIS — Z1231 Encounter for screening mammogram for malignant neoplasm of breast: Secondary | ICD-10-CM | POA: Diagnosis not present

## 2019-10-26 LAB — HM MAMMOGRAPHY

## 2019-10-29 ENCOUNTER — Other Ambulatory Visit: Payer: Self-pay

## 2019-10-29 MED ORDER — CARVEDILOL 12.5 MG PO TABS: 12.5000 mg | ORAL_TABLET | Freq: Two times a day (BID) | ORAL | 3 refills | Status: DC

## 2019-11-03 ENCOUNTER — Other Ambulatory Visit: Payer: Self-pay

## 2019-11-03 ENCOUNTER — Other Ambulatory Visit (INDEPENDENT_AMBULATORY_CARE_PROVIDER_SITE_OTHER): Payer: PPO

## 2019-11-03 DIAGNOSIS — E785 Hyperlipidemia, unspecified: Secondary | ICD-10-CM | POA: Diagnosis not present

## 2019-11-03 DIAGNOSIS — I10 Essential (primary) hypertension: Secondary | ICD-10-CM | POA: Diagnosis not present

## 2019-11-03 DIAGNOSIS — E559 Vitamin D deficiency, unspecified: Secondary | ICD-10-CM

## 2019-11-03 LAB — COMPREHENSIVE METABOLIC PANEL
ALT: 9 U/L (ref 0–35)
AST: 16 U/L (ref 0–37)
Albumin: 4.4 g/dL (ref 3.5–5.2)
Alkaline Phosphatase: 69 U/L (ref 39–117)
BUN: 20 mg/dL (ref 6–23)
CO2: 21 mEq/L (ref 19–32)
Calcium: 9.3 mg/dL (ref 8.4–10.5)
Chloride: 106 mEq/L (ref 96–112)
Creatinine, Ser: 0.9 mg/dL (ref 0.40–1.20)
GFR: 62.23 mL/min (ref >60.00)
Glucose, Bld: 98 mg/dL (ref 70–99)
Potassium: 4.9 mEq/L (ref 3.5–5.1)
Sodium: 136 mEq/L (ref 135–145)
Total Bilirubin: 0.7 mg/dL (ref 0.2–1.2)
Total Protein: 7.1 g/dL (ref 6.0–8.3)

## 2019-11-03 LAB — LIPID PANEL
Cholesterol: 120 mg/dL (ref 0–200)
HDL: 49 mg/dL (ref >39.00)
LDL Cholesterol: 35 mg/dL (ref 0–99)
NonHDL: 71.35
Total CHOL/HDL Ratio: 2
Triglycerides: 180 mg/dL — ABNORMAL HIGH (ref 0.0–149.0)
VLDL: 36 mg/dL (ref 0.0–40.0)

## 2019-11-03 LAB — VITAMIN D 25 HYDROXY (VIT D DEFICIENCY, FRACTURES): VITD: 26.49 ng/mL — ABNORMAL LOW (ref 30.00–100.00)

## 2019-11-26 ENCOUNTER — Other Ambulatory Visit: Payer: Self-pay | Admitting: Physician Assistant

## 2019-11-26 DIAGNOSIS — I25810 Atherosclerosis of coronary artery bypass graft(s) without angina pectoris: Secondary | ICD-10-CM

## 2020-01-03 ENCOUNTER — Ambulatory Visit: Payer: PPO | Admitting: Internal Medicine

## 2020-01-04 ENCOUNTER — Other Ambulatory Visit: Payer: Self-pay

## 2020-01-04 MED ORDER — AMLODIPINE BESYLATE 10 MG PO TABS: ORAL_TABLET | ORAL | 1 refills | Status: DC

## 2020-01-07 ENCOUNTER — Ambulatory Visit (INDEPENDENT_AMBULATORY_CARE_PROVIDER_SITE_OTHER): Payer: Medicare HMO | Admitting: Internal Medicine

## 2020-01-07 ENCOUNTER — Encounter: Payer: Self-pay | Admitting: Internal Medicine

## 2020-01-07 ENCOUNTER — Other Ambulatory Visit: Payer: Self-pay

## 2020-01-07 VITALS — BP 130/68 | Ht 59.0 in | Wt 139.0 lb

## 2020-01-07 DIAGNOSIS — F4321 Adjustment disorder with depressed mood: Secondary | ICD-10-CM

## 2020-01-07 DIAGNOSIS — E785 Hyperlipidemia, unspecified: Secondary | ICD-10-CM | POA: Diagnosis not present

## 2020-01-07 DIAGNOSIS — M5416 Radiculopathy, lumbar region: Secondary | ICD-10-CM

## 2020-01-07 DIAGNOSIS — Z1211 Encounter for screening for malignant neoplasm of colon: Secondary | ICD-10-CM

## 2020-01-07 DIAGNOSIS — I10 Essential (primary) hypertension: Secondary | ICD-10-CM | POA: Diagnosis not present

## 2020-01-07 MED ORDER — HYDROCODONE-ACETAMINOPHEN 10-325 MG PO TABS: 1.0000 | ORAL_TABLET | Freq: Four times a day (QID) | ORAL | 0 refills | Status: DC | PRN
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
## 2020-01-09 DIAGNOSIS — F4321 Adjustment disorder with depressed mood: Secondary | ICD-10-CM | POA: Insufficient documentation

## 2020-01-09 NOTE — Assessment & Plan Note (Signed)
Well controlled on current regimen that includes carvedilol and amlodipine  Renal function stable, no changes today. She had recurrent hyperkalemia with lisinopril trial.   Lab Results  Component Value Date   CREATININE 0.90 11/03/2019   Lab Results  Component Value Date   NA 136 11/03/2019   K 4.9 11/03/2019   CL 106 11/03/2019   CO2 21 11/03/2019

## 2020-01-09 NOTE — Assessment & Plan Note (Signed)
Secondary to DDD and scoliosis managed with vicodin and methocarbamol  .  She has not had any ER visits  And has not requested any early refills.  Her Refill history was confirmed via [place] Controlled Substance database by me today during her visit and there have been no prescriptions of controlled substances filled from any providers other than me. .Her pain is better controlled on the current regimen and she is staying active. [name]  Refills given for March, April and May written and sent

## 2020-01-09 NOTE — Assessment & Plan Note (Signed)
LDL is at goal of < 70 on crestor high dose for known CAD and PAD.LFTs are normal.   Lab Results  Component Value Date   CHOL 120 11/03/2019   HDL 49.00 11/03/2019   LDLCALC 35 11/03/2019   LDLDIRECT 47.0 03/14/2017   TRIG 180.0 (H) 11/03/2019   CHOLHDL 2 11/03/2019   Lab Results  Component Value Date   ALT 9 11/03/2019   AST 16 11/03/2019   ALKPHOS 69 11/03/2019   BILITOT 0.7 11/03/2019

## 2020-01-09 NOTE — Assessment & Plan Note (Signed)
Patient is dealing with the loss of multiple friends and loved ones , and has adequate coping skills and emotional support .  i have asked patient to return in one month to examine for signs of unresolving grief.

## 2020-01-14 ENCOUNTER — Other Ambulatory Visit: Payer: Self-pay

## 2020-01-14 MED ORDER — GABAPENTIN 300 MG PO CAPS: ORAL_CAPSULE | ORAL | 1 refills | Status: DC

## 2020-01-25 ENCOUNTER — Other Ambulatory Visit: Payer: Self-pay | Admitting: Physician Assistant

## 2020-01-31 DIAGNOSIS — Z1211 Encounter for screening for malignant neoplasm of colon: Secondary | ICD-10-CM | POA: Diagnosis not present

## 2020-02-04 LAB — COLOGUARD
COLOGUARD: POSITIVE — AB
Cologuard: POSITIVE — AB

## 2020-02-05 ENCOUNTER — Other Ambulatory Visit: Payer: Self-pay | Admitting: Internal Medicine

## 2020-02-07 ENCOUNTER — Telehealth: Payer: Self-pay | Admitting: Internal Medicine

## 2020-02-07 ENCOUNTER — Other Ambulatory Visit: Payer: Self-pay | Admitting: Internal Medicine

## 2020-02-07 DIAGNOSIS — R195 Other fecal abnormalities: Secondary | ICD-10-CM

## 2020-02-07 MED ORDER — METHOCARBAMOL 750 MG PO TABS: ORAL_TABLET | ORAL | 1 refills | Status: DC

## 2020-02-07 NOTE — Addendum Note (Signed)
Addended by: [name] on: 02/07/2020 01:22 PM   Modules accepted: Orders

## 2020-02-07 NOTE — Telephone Encounter (Signed)
Pt is out of methocarbamol (ROBAXIN) 750 MG tablet and needs it filled asap

## 2020-02-07 NOTE — Assessment & Plan Note (Signed)
The results of patient's cologuard is  Positive. This may indicate a polyp somewhere in the colon.  I  would like to refer her to GI for further evaluation .

## 2020-02-07 NOTE — Telephone Encounter (Signed)
Refill request for Robaxin, last seen 01-07-20, last filled 07-29-19.  Please advise.

## 2020-02-07 NOTE — Telephone Encounter (Signed)
Unable to leave message for patient to return call back.  

## 2020-02-07 NOTE — Telephone Encounter (Signed)
Robaxin refilled  The results of patient's cologuard is  Positive. This may indicate a polyp somewhere in the colon.  I  would like to refer her to GI for further evaluation .    Is she willing to see on and does he have a preference?

## 2020-02-07 NOTE — Telephone Encounter (Signed)
A Mychart has also been sent to inform patient.

## 2020-02-07 NOTE — Telephone Encounter (Signed)
Patient has positive cologuard in yellow folder abstracted.

## 2020-02-16 MED ORDER — ALBUTEROL SULFATE HFA 108 (90 BASE) MCG/ACT IN AERS: 2.0000 | INHALATION_SPRAY | Freq: Four times a day (QID) | RESPIRATORY_TRACT | 2 refills | Status: DC | PRN

## 2020-02-25 ENCOUNTER — Telehealth: Payer: Self-pay | Admitting: Internal Medicine

## 2020-02-25 NOTE — Telephone Encounter (Signed)
Rejection Reason - Patient Declined - Patient states she can not have a colonoscopy and will not have another one. She states she had one in [place] and they told her intestines were to twisted to have a colonoscopy. Offered a appointment to discuss with a provider. Patient declined " [facility] said about 3 hours ago

## 2020-02-28 NOTE — Telephone Encounter (Signed)
I called pt to encourage pt to call and schedule. No vm no answer.

## 2020-02-28 NOTE — Telephone Encounter (Signed)
There are alternative ways to rule out colon cancer and she should have the discussion of the alternatives with the gastroenterologst rather than choosing not to see them at all.  She had a positive cologuard,  Which means she could have a colon cancer!  She needs to call them and schedule an appt to discuss

## 2020-03-09 ENCOUNTER — Telehealth: Payer: Self-pay | Admitting: Internal Medicine

## 2020-03-09 NOTE — Telephone Encounter (Signed)
Pt requested to the number to [facility] I left number of [phone] on vm.

## 2020-04-05 ENCOUNTER — Encounter: Payer: Self-pay | Admitting: Internal Medicine

## 2020-04-05 ENCOUNTER — Telehealth (INDEPENDENT_AMBULATORY_CARE_PROVIDER_SITE_OTHER): Payer: Medicare HMO | Admitting: Internal Medicine

## 2020-04-05 DIAGNOSIS — I10 Essential (primary) hypertension: Secondary | ICD-10-CM

## 2020-04-05 DIAGNOSIS — I2510 Atherosclerotic heart disease of native coronary artery without angina pectoris: Secondary | ICD-10-CM

## 2020-04-05 DIAGNOSIS — J301 Allergic rhinitis due to pollen: Secondary | ICD-10-CM | POA: Diagnosis not present

## 2020-04-05 DIAGNOSIS — M5416 Radiculopathy, lumbar region: Secondary | ICD-10-CM | POA: Diagnosis not present

## 2020-04-05 MED ORDER — HYDROCODONE-ACETAMINOPHEN 10-325 MG PO TABS: 1.0000 | ORAL_TABLET | Freq: Four times a day (QID) | ORAL | 0 refills | Status: DC | PRN

## 2020-04-05 NOTE — Assessment & Plan Note (Signed)
Continue ceterizine,  flonase and saline irrigation after working outside  

## 2020-04-05 NOTE — Assessment & Plan Note (Signed)
Secondary to DDD and scoliosis managed with vicodin and methocarbamol  .  She has not had any ER visits  And has not requested any early refills.  Her Refill history was confirmed via [place] Controlled Substance database by me today during her visit and there have been no prescriptions of controlled substances filled from any providers other than me. .Her pain is better controlled on the current regimen and she is staying active. [name]  Refills given for June ,  July and August have been authorized and sent
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
## 2020-04-05 NOTE — Patient Instructions (Signed)
  The new goals for optimal blood pressure management are 120/70.  Please check your blood pressure a few times at home and send me the readings so I can determine if you need a change in medication

## 2020-04-05 NOTE — Assessment & Plan Note (Signed)
she reports compliance with medication regimen  but has an elevated reading today at home .  She is not using NSAIDs daily.  Discussed goal of 120/70  (130/80 for patients over 70)  to preserve renal function.  She has been asked to check her  BP  at home and  submit readings for evaluation. Renal function, electrolytes and screen for proteinuria have been done

## 2020-04-05 NOTE — Assessment & Plan Note (Addendum)
She remains asymptomatic.  Continue plavix, crestor,  carvedilol

## 2020-04-19 NOTE — Addendum Note (Signed)
Addended by: [name] on: 04/19/2020 05:15 PM   Modules accepted: Level of Service

## 2020-05-05 ENCOUNTER — Other Ambulatory Visit: Payer: Self-pay

## 2020-05-05 DIAGNOSIS — J302 Other seasonal allergic rhinitis: Secondary | ICD-10-CM | POA: Diagnosis not present

## 2020-05-05 DIAGNOSIS — R0981 Nasal congestion: Secondary | ICD-10-CM | POA: Diagnosis not present

## 2020-05-05 DIAGNOSIS — R05 Cough: Secondary | ICD-10-CM | POA: Diagnosis not present

## 2020-05-05 MED ORDER — ALBUTEROL SULFATE HFA 108 (90 BASE) MCG/ACT IN AERS: 2.0000 | INHALATION_SPRAY | Freq: Four times a day (QID) | RESPIRATORY_TRACT | 2 refills | Status: DC | PRN

## 2020-05-11 ENCOUNTER — Ambulatory Visit: Payer: Medicare HMO | Admitting: Gastroenterology

## 2020-05-11 ENCOUNTER — Other Ambulatory Visit: Payer: Self-pay

## 2020-05-11 VITALS — BP 159/77 | HR 89 | Temp 98.2°F | Ht 59.0 in | Wt 149.0 lb

## 2020-05-11 DIAGNOSIS — R195 Other fecal abnormalities: Secondary | ICD-10-CM
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
## 2020-05-16 ENCOUNTER — Telehealth: Payer: Self-pay

## 2020-05-16 NOTE — Telephone Encounter (Signed)
Left a message for the patient to call back and speak to the on call preop APP of the day.   Suzanne Cole has a h/o CAD, last cath was in 2016, 70% lesion in prox LAD treated with DES, chronically occluded RCA present. Patient was last seen in Dec 2020 at which time she was doing well.   Dr. Fletcher Anon, please comment on if she would be ok to hold plavix for 5 days prior to colonoscopy as long as she does not have any anginal symptom.   Please send your response to P CV DIV PREOP

## 2020-05-16 NOTE — Telephone Encounter (Signed)
   [facility] Pre-operative Risk Assessment    HEARTCARE STAFF: - Please ensure there is not already an duplicate clearance open for this procedure. - Under Visit Info/Reason for Call, type in Other and utilize the format Clearance MM/DD/YY or Clearance TBD. Do not use dashes or single digits. - If request is for dental extraction, please clarify the # of teeth to be extracted.  Request for surgical clearance:  1. What type of surgery is being performed? Colonoscopy   2. When is this surgery scheduled? 06/30/20   3. What type of clearance is required (medical clearance vs. Pharmacy clearance to hold med vs. Both)? Both  4. Are there any medications that need to be held prior to surgery and how long? Plavix, 5 day hold   5. Practice name and name of physician performing surgery? [facility],  Dr. [name]   6. What is the office phone number?  [phone]   7.   What is the office fax number? [phone]  8.   Anesthesia type (None, local, MAC, general) ? General   Rushie Chestnut 05/16/2020, 10:43 AM  _________________________________________________________________   (provider comments below)

## 2020-05-18 NOTE — Telephone Encounter (Signed)
Left message on her cell phone to call back and speak to the on call preop APP of the day. (note, her home phone has been disconnected and no longer in service)

## 2020-05-18 NOTE — Telephone Encounter (Signed)
Can hold Plavix 5 days prior to colonoscopy.

## 2020-05-25 NOTE — Telephone Encounter (Signed)
   Primary Cardiologist: Kathlyn Sacramento, MD  Left a voicemail for patient to call back for ongoing preop assessment.  Abigail Butts, PA-C 05/25/2020, 11:10 AM

## 2020-05-26 NOTE — Telephone Encounter (Signed)
   Primary Cardiologist: Kathlyn Sacramento, MD  Chart reviewed as part of pre-operative protocol coverage. Patient was contacted 05/26/2020 in reference to pre-operative risk assessment for pending surgery as outlined below.  Suzanne Cole was last seen on 10/2019 by Dr. Fletcher Anon. 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 It appears attempts have been made to try to reach this patient on numerous occasions 7/8, 7/15 and now 7/16. LMCTB on cell phone today so that we may discuss how she is doing to ensure we can clear for procedure. As below, home number is disconnected. She has DPR on file for husband [name] but his phone states it is not accepting calls at this time. Will need to await callback before we can provide final clearance. Will route an updated FYI to requesting party with request that if they speak to patient in the meantime to please ask her to call our office.   Charlie Pitter, PA-C 05/26/2020, 9:52 AM

## 2020-06-09 ENCOUNTER — Telehealth: Payer: Self-pay | Admitting: Internal Medicine

## 2020-06-09 NOTE — Telephone Encounter (Signed)
Patient has refill available tomorrow.

## 2020-06-09 NOTE — Telephone Encounter (Signed)
Pt needs a refill on HYDROcodone-acetaminophen (NORCO) 10-325 MG tablet sent to [facility]  Pt is out of medication

## 2020-06-12 NOTE — Telephone Encounter (Signed)
Pt is following up on this request.

## 2020-06-13 ENCOUNTER — Telehealth: Payer: Self-pay

## 2020-06-13 NOTE — Telephone Encounter (Signed)
Error

## 2020-06-13 NOTE — Telephone Encounter (Signed)
Disregard message. Spoke with pharmacy and they stated that they had a prescription on file. Spoke with pt and she is going to get it filled.

## 2020-06-14 NOTE — Telephone Encounter (Signed)
Left message on cell phone.  Asking her to call back.

## 2020-06-20 ENCOUNTER — Encounter: Payer: Self-pay | Admitting: Cardiovascular Disease

## 2020-06-20 ENCOUNTER — Ambulatory Visit: Payer: Medicare HMO | Admitting: Cardiovascular Disease

## 2020-06-20 ENCOUNTER — Other Ambulatory Visit: Payer: Self-pay

## 2020-06-20 VITALS — BP 154/76 | HR 78 | Ht 59.0 in | Wt 150.0 lb

## 2020-06-20 DIAGNOSIS — I25118 Atherosclerotic heart disease of native coronary artery with other forms of angina pectoris: Secondary | ICD-10-CM | POA: Diagnosis not present

## 2020-06-20 DIAGNOSIS — I10 Essential (primary) hypertension: Secondary | ICD-10-CM | POA: Diagnosis not present

## 2020-06-20 DIAGNOSIS — E785 Hyperlipidemia, unspecified: Secondary | ICD-10-CM | POA: Diagnosis not present

## 2020-06-20 DIAGNOSIS — R0602 Shortness of breath: Secondary | ICD-10-CM

## 2020-06-20 DIAGNOSIS — I779 Disorder of arteries and arterioles, unspecified: Secondary | ICD-10-CM | POA: Diagnosis not present

## 2020-06-20 MED ORDER — HYDROCHLOROTHIAZIDE 12.5 MG PO CAPS
12.5000 mg | ORAL_CAPSULE | Freq: Every day | ORAL | 5 refills | Status: DC
Start: 2020-06-20 — End: 2020-06-28

## 2020-06-20 NOTE — Patient Instructions (Signed)
Medication Instructions:  Your physician has recommended you make the following change in your medication:   START HCTZ 12.5 mg twice daily. An Rx has been sent to your pharmacy.  *If you need a refill on your cardiac medications before your next appointment, please call your pharmacy*   Lab Work: Your physician recommends that you return for lab work in: 1 week.  Please have your lab drawn at the [facility] medical mall. You do not need an appointment. Lab hours are Mon-Fri 7am-6pm.   If you have labs (blood work) drawn today and your tests are completely normal, you will receive your results only by:  [place] (if you have MyChart) OR  A paper copy in the mail If you have any lab test that is abnormal or we need to change your treatment, we will call you to review the results.   Testing/Procedures: Your physician has requested that you have an echocardiogram. Echocardiography is a painless test that uses sound waves to create images of your heart. It provides your doctor with information about the size and shape of your heart and how well your hearts chambers and valves are working. This procedure takes approximately one hour. There are no restrictions for this procedure.  Your physician has requested that you have a lexiscan myoview. For further information please visit [email or web address]. Please follow instruction sheet, as given.     Follow-Up: At [facility], you and your health needs are our priority.  As part of our continuing mission to provide you with exceptional heart care, we have created designated Provider Care Teams.  These Care Teams include your primary Cardiologist (physician) and Advanced Practice Providers (APPs -  Physician Assistants and Nurse Practitioners) who all work together to provide you with the care you need, when you need it.  We recommend signing up for the patient portal called "MyChart".  Sign up information is provided on this After Visit  Summary.  MyChart is used to connect with patients for Virtual Visits (Telemedicine).  Patients are able to view lab/test results, encounter notes, upcoming appointments, etc.  Non-urgent messages can be sent to your provider as well.   To learn more about what you can do with MyChart, go to [email or web address].    Your next appointment:   After testing    The format for your next appointment:   In Person  Provider:    You may see Kathlyn Sacramento, MD or one of the following Advanced Practice Providers on your designated Care Team:    Murray Hodgkins, NP  Christell Faith, PA-C  Marrianne Mood, PA-C    Other Instructions [place]  Your caregiver has ordered a Stress Test with nuclear imaging. The purpose of this test is to evaluate the blood supply to your heart muscle. This procedure is referred to as a "Non-Invasive Stress Test." This is because other than having an IV started in your vein, nothing is inserted or "invades" your body. Cardiac stress tests are done to find areas of poor blood flow to the heart by determining the extent of coronary artery disease (CAD). Some patients exercise on a treadmill, which naturally increases the blood flow to your heart, while others who are  unable to walk on a treadmill due to physical limitations have a pharmacologic/chemical stress agent called Lexiscan . This medicine will mimic walking on a treadmill by temporarily increasing your coronary blood flow.   Please note: these test may take anywhere between 2-4 hours to complete  PLEASE  REPORT TO [facility] ENTRANCE  THE VOLUNTEERS AT THE FIRST DESK WILL DIRECT YOU WHERE TO GO  Date of Procedure:_____________________________________  Arrival Time for Procedure:______________________________  Instructions regarding medication:     __X__:  Hold betablocker(Carvedilol) night before procedure and morning of procedure   PLEASE NOTIFY THE OFFICE AT LEAST 24 HOURS IN ADVANCE IF  YOU ARE UNABLE TO KEEP YOUR APPOINTMENT.  [phone] AND  PLEASE NOTIFY NUCLEAR MEDICINE AT [facility] AT LEAST 24 HOURS IN ADVANCE IF YOU ARE UNABLE TO KEEP YOUR APPOINTMENT. [phone]  How to prepare for your Myoview test:  1. Do not eat or drink after midnight 2. No caffeine for 24 hours prior to test 3. No smoking 24 hours prior to test. 4. Your medication may be taken with water.  If your doctor stopped a medication because of this test, do not take that medication. 5. Ladies, please do not wear dresses.  Skirts or pants are appropriate. Please wear a short sleeve shirt. 6. No perfume, cologne or lotion. 7. Wear comfortable walking shoes. No heels!

## 2020-06-20 NOTE — Telephone Encounter (Signed)
Multiple attempts to contact this patient over the past month have been unsuccessful, will remove this from pre op pool.  Kerin Ransom PA-C 06/20/2020 8:14 AM
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
## 2020-06-28 ENCOUNTER — Telehealth: Payer: Self-pay | Admitting: Cardiovascular Disease

## 2020-06-28 ENCOUNTER — Other Ambulatory Visit
Admission: RE | Admit: 2020-06-28 | Discharge: 2020-06-28 | Disposition: A | Discharge status: Home / Self Care | Payer: Medicare HMO | Attending: Cardiovascular Disease | Admitting: Cardiovascular Disease

## 2020-06-28 DIAGNOSIS — I10 Essential (primary) hypertension: Secondary | ICD-10-CM | POA: Diagnosis not present

## 2020-06-28 DIAGNOSIS — R0602 Shortness of breath: Secondary | ICD-10-CM | POA: Diagnosis not present

## 2020-06-28 LAB — BASIC METABOLIC PANEL
Anion gap: 13 (ref 5–15)
BUN: 20 mg/dL (ref 8–23)
CO2: 25 mmol/L (ref 22–32)
Calcium: 9.5 mg/dL (ref 8.9–10.3)
Chloride: 100 mmol/L (ref 98–111)
Creatinine, Ser: 1.21 mg/dL — ABNORMAL HIGH (ref 0.44–1.00)
GFR calc Af Amer: 53 mL/min — ABNORMAL LOW (ref >60)
GFR calc non Af Amer: 46 mL/min — ABNORMAL LOW (ref >60)
Glucose, Bld: 109 mg/dL — ABNORMAL HIGH (ref 70–99)
Potassium: 4.3 mmol/L (ref 3.5–5.1)
Sodium: 138 mmol/L (ref 135–145)

## 2020-06-28 MED ORDER — CARVEDILOL 25 MG PO TABS: 25.0000 mg | ORAL_TABLET | Freq: Two times a day (BID) | ORAL | 3 refills | Status: DC

## 2020-06-28 NOTE — Telephone Encounter (Signed)
Patient calling  Needs a dosage clarification for hydrochlorothiazide medication - patient has been taking once daily but sees on paperwork she should be taking twice daily  If patient has been taking medication incorrectly, would Dr Fletcher Anon like to wait for blood work to be completed Please call to discuss

## 2020-06-28 NOTE — Telephone Encounter (Signed)
Spoke with patient and reviewed that results of labs are back. Reviewed results and recommendations with her and she was agreeable. Discontinued her HCTZ and increased her carvedilol to 25 mg twice a day. Made changes to her medication list and she verbalized understanding of instructions with no further questions at this time.

## 2020-06-28 NOTE — Telephone Encounter (Signed)
Spoke with patient and clarified medication changes. She wanted to clarify how to use up her current supply of 12.5 mg and reviewed that she could take 2 tablets twice a day until she picks up new prescription. She verbalized understanding of instructions. Called pharmacy and spoke with [facility] pharmacist and requested they discontinue HCTZ and Carvedilol 12.5 mg pills in order to prevent any mix ups. She took care of that as well.

## 2020-06-28 NOTE — Telephone Encounter (Signed)
Left voicemail message to call back  

## 2020-06-28 NOTE — Telephone Encounter (Signed)
Patient was reviewing paperwork to go get labs done and realized that her AVS had take hydrochlorothiazide 12.5 mg take twice daily. She reports when picking up from pharmacy it had on label to take once daily and that is how she has been taking that medication. She wanted to confirm if labs were still needed as this is her 7th day. Advised that was correct and she is going today to have that done. Reviewed providers note as well as AVS and doctor instructions were to take once daily. Discussed this with patient and she verbalized understanding and was appreciative for the follow up on dosage and frequency. She had no further questions or concerns at this time.

## 2020-06-28 NOTE — Telephone Encounter (Signed)
See result note.  

## 2020-06-28 NOTE — Telephone Encounter (Signed)
-----   Message from Wellington Hampshire, MD sent at 06/28/2020  2:40 PM EDT ----- Slight worsening of renal function.  Recommend stopping hydrochlorothiazide altogether.  Increase carvedilol to 25 mg twice daily.

## 2020-06-28 NOTE — Telephone Encounter (Signed)
Patient calling back  States she is confused on prescription information Please call to discuss

## 2020-06-29 ENCOUNTER — Encounter: Payer: Self-pay | Admitting: Internal Medicine

## 2020-06-29 ENCOUNTER — Other Ambulatory Visit: Payer: Self-pay

## 2020-06-29 ENCOUNTER — Ambulatory Visit (INDEPENDENT_AMBULATORY_CARE_PROVIDER_SITE_OTHER): Payer: Medicare HMO | Admitting: Internal Medicine

## 2020-06-29 VITALS — BP 136/76 | HR 75 | Temp 98.3°F | Resp 17 | Ht 59.0 in | Wt 150.0 lb

## 2020-06-29 DIAGNOSIS — N184 Chronic kidney disease, stage 4 (severe): Secondary | ICD-10-CM

## 2020-06-29 DIAGNOSIS — I70209 Unspecified atherosclerosis of native arteries of extremities, unspecified extremity: Secondary | ICD-10-CM | POA: Diagnosis not present

## 2020-06-29 DIAGNOSIS — E559 Vitamin D deficiency, unspecified: Secondary | ICD-10-CM

## 2020-06-29 DIAGNOSIS — E785 Hyperlipidemia, unspecified: Secondary | ICD-10-CM

## 2020-06-29 DIAGNOSIS — I10 Essential (primary) hypertension: Secondary | ICD-10-CM | POA: Diagnosis not present

## 2020-06-29 DIAGNOSIS — Z955 Presence of coronary angioplasty implant and graft: Secondary | ICD-10-CM

## 2020-06-29 DIAGNOSIS — I5030 Unspecified diastolic (congestive) heart failure: Secondary | ICD-10-CM

## 2020-06-29 DIAGNOSIS — I130 Hypertensive heart and chronic kidney disease with heart failure and stage 1 through stage 4 chronic kidney disease, or unspecified chronic kidney disease: Secondary | ICD-10-CM | POA: Diagnosis not present

## 2020-06-29 DIAGNOSIS — M5416 Radiculopathy, lumbar region: Secondary | ICD-10-CM

## 2020-06-29 DIAGNOSIS — I2510 Atherosclerotic heart disease of native coronary artery without angina pectoris: Secondary | ICD-10-CM

## 2020-06-29 DIAGNOSIS — N183 Chronic kidney disease, stage 3 unspecified: Secondary | ICD-10-CM

## 2020-06-29 MED ORDER — HYDROCODONE-ACETAMINOPHEN 10-325 MG PO TABS: 1.0000 | ORAL_TABLET | Freq: Four times a day (QID) | ORAL | 0 refills | Status: DC | PRN

## 2020-06-29 NOTE — Progress Notes (Signed)
Subjective:  Patient ID: Suzanne Cole, female    DOB: January 08, 1951  Age: 69 y.o. MRN: [id number]  CC: The primary encounter diagnosis was Vitamin D deficiency. 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  HPI Celester A Hortman presents for follow up on chronic conditions   This visit occurred during the SARS-CoV-2 public health emergency.  Safety protocols were in place, including screening questions prior to the visit, additional usage of staff PPE, and extensive cleaning of exam room while observing appropriate contact time as indicated for disinfecting solutions.   Since her last visit with me in May:  1) colonscopy for positive Cologuard test was scheduled but postponed due to cardiac issues  2)  Saw cardiology for anginal equivalent on August 10.  hctz started for uncontrolled htn,  Develop ARF based  On yesterday's BMET.    Med stopped  And carvedilol increased to 25 twice daily . ECHO and Myoview scheduled (myoview tomorrow ,  ECHO aug 31)   3) HTN: carvedilol dose increased today ad hctz stopped  4) Chronic pain:  Managed with hydrocodone . Refill history confirmed via [place] Controlled Substance databas, accessed by me today..  Outpatient Medications Prior to Visit  Medication Sig Dispense Refill  . albuterol (PROVENTIL) (2.5 MG/3ML) 0.083% nebulizer solution Take 3 mLs (2.5 mg total) by nebulization every 6 (six) hours as needed for wheezing or shortness of breath. 150 mL 1  . albuterol (VENTOLIN HFA) 108 (90 Base) MCG/ACT inhaler Inhale 2 puffs into the lungs every 6 (six) hours as needed for wheezing or shortness of  breath. FILL WITH PROAIR PER INSURANCE 18 g 2  . amLODipine (NORVASC) 10 MG tablet TAKE 1 TABLET(10 MG) BY MOUTH DAILY FOR HIGH BLOOD PRESSURE 90 tablet 1  . aspirin 81 MG tablet Take 81 mg by mouth as needed for pain.    . carvedilol (COREG) 25 MG tablet Take 1 tablet (25 mg total) by mouth 2 (two) times daily. 180 tablet 3  . cetirizine (ZYRTEC) 10 MG tablet Take 10 mg by mouth daily.    . Cholecalciferol (VITAMIN D3) 1000 units CHEW Chew by mouth daily.     . clopidogrel (PLAVIX) 75 MG tablet TAKE 1 TABLET BY MOUTH ONCE DAILY 90 tablet 2  . gabapentin (NEURONTIN) 300 MG capsule TAKE 1 CAPSULE(300 MG) BY MOUTH THREE TIMES DAILY 270 capsule 1  . methocarbamol (ROBAXIN) 750 MG tablet TAKE 1 TABLET(750 MG) BY MOUTH FOUR TIMES DAILY 360 tablet 1  . Omega-3 Fatty Acids (FISH OIL) 1000 MG CAPS Take 3 capsules (3,000 mg total) by mouth daily. 90 capsule 0  . pantoprazole (PROTONIX) 40 MG tablet Take 1 tablet (40 mg total) by mouth daily. 90 tablet 2  . rosuvastatin (CRESTOR) 40 MG tablet TAKE 1 TABLET(40 MG) BY MOUTH DAILY 90 tablet 2  . triamcinolone cream (KENALOG) 0.1 % Apply 1 application topically 2 (two) times daily. 30 g 0  . VITAMIN D PO Take 1 capsule by mouth daily. Take 5,000 IUs daily.    [name] HYDROcodone-acetaminophen (NORCO) 10-325 MG tablet Take 1 tablet by mouth every 6 (six) hours as needed. 120 tablet 0  . HYDROcodone-acetaminophen (NORCO) 10-325 MG tablet Take 1  tablet by mouth every 6 (six) hours as needed. 120 tablet 0  . HYDROcodone-acetaminophen (NORCO) 10-325 MG tablet Take 1 tablet by mouth every 6 (six) hours as needed. 120 tablet 0   No facility-administered medications prior to visit.    Review of Systems;  Patient denies headache, fevers, malaise, unintentional weight loss, skin rash, eye pain, sinus congestion and sinus pain, sore throat, dysphagia,  hemoptysis , cough, dyspnea, wheezing, chest pain, palpitations, orthopnea, edema, abdominal pain, nausea, melena,  diarrhea, constipation, flank pain, dysuria, hematuria, urinary  Frequency, nocturia, numbness, tingling, seizures,  Focal weakness, Loss of consciousness,  Tremor, insomnia, depression, anxiety, and suicidal ideation.      Objective:  BP 136/76 (BP Location: Left Arm, Patient Position: Sitting, Cuff Size: Normal)   Pulse 75   Temp 98.3 F (36.8 C) (Oral)   Resp 17   Ht 4\' 11"  (1.499 m)   Wt 150 lb (68 kg)   SpO2 98%   BMI 30.30 kg/m   BP Readings from Last 3 Encounters:  06/29/20 136/76  06/20/20 (!) 154/76  05/11/20 (!) 159/77    Wt Readings from Last 3 Encounters:  06/29/20 150 lb (68 kg)  06/20/20 150 lb (68 kg)  05/11/20 149 lb (67.6 kg)    General appearance: alert, cooperative and appears stated age Ears: normal TM's and external ear canals both ears Throat: lips, mucosa, and tongue normal; teeth and gums normal Neck: no adenopathy, no carotid bruit, supple, symmetrical, trachea midline and thyroid not enlarged, symmetric, no tenderness/mass/nodules Back: symmetric, no curvature. ROM normal. No CVA tenderness. Lungs: clear to auscultation bilaterally Heart: regular rate and rhythm, S1, S2 normal, no murmur, click, rub or gallop Abdomen: soft, non-tender; bowel sounds normal; no masses,  no organomegaly Pulses: 2+ and symmetric Skin: Skin color, texture, turgor normal. No rashes or lesions Lymph nodes: Cervical, supraclavicular, and axillary nodes normal.  Lab Results  Component Value Date   HGBA1C 5.6 03/14/2017   HGBA1C 5.7 11/09/2014    Lab Results  Component Value Date   CREATININE 1.21 (H) 06/28/2020   CREATININE 0.90 11/03/2019   CREATININE 0.97 03/23/2019    Lab Results  Component Value Date   WBC 8.6 01/29/2018   HGB 12.0 01/29/2018   HCT 35.7 (L) 01/29/2018   PLT 316.0 01/29/2018   GLUCOSE 109 (H) 06/28/2020   CHOL 120 11/03/2019   TRIG 180.0 (H) 11/03/2019   HDL 49.00 11/03/2019   LDLDIRECT 47.0 03/14/2017   LDLCALC 35 11/03/2019   ALT  9 11/03/2019   AST 16 11/03/2019   NA 138 06/28/2020   K 4.3 06/28/2020   CL 100 06/28/2020   CREATININE 1.21 (H) 06/28/2020   BUN 20 06/28/2020   CO2 25 06/28/2020   TSH 1.29 08/03/2015   INR 0.9 11/28/2014   HGBA1C 5.6 03/14/2017   MICROALBUR <0.7 12/13/2016    No results found.  Assessment & Plan:   Problem List Items Addressed This Visit      Unprioritized   Coronary artery disease involving native coronary artery of native heart without angina pectoris    She was seen by Fletcher Anon for new onset dyspnea and his comments were as follows: Coronary artery disease involving native coronary arteries with angina equivalent, I am concerned that her exertional dyspnea is due to progressive coronary artery disease especially that her LAD stent was long and 2.5 in diameter and thus at high risk for restenosis.  I requested a Lexiscan Myoview with low threshold to proceed with  cardiac catheterization given her symptoms.  I also requested an echocardiogram.  We have to control her blood pressure.      Relevant Orders   Lipid panel   S/P drug eluting coronary stent placement    She remains asymptomatic.  Continue plavix, crestor,  carvedilol       Atherosclerotic peripheral vascular disease ([place])    Repeat carotid ultrasound in 2017 noted mild stable disease.  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   Lab Results  Component Value Date   CHOL 120 11/03/2019   HDL 49.00 11/03/2019   LDLCALC 35 11/03/2019   LDLDIRECT 47.0 03/14/2017   TRIG 180.0 (H) 11/03/2019   CHOLHDL 2 11/03/2019   Lab Results  Component Value Date    ALT 9 11/03/2019   AST 16 11/03/2019   ALKPHOS 69 11/03/2019   BILITOT 0.7 11/03/2019         Hypertension    She was prescribed hctz by cardiology but developed acute renal failure, so the medication was stopped   Continue amlodipine and carvedilol.  History of hyperkalemia on lisinopril       Lumbar radiculitis    Secondary to DDD and scoliosis managed with vicodin and methocarbamol  .  She has not had any ER visits  And has not requested any early refills.  Her Refill history was confirmed via [place] Controlled Substance database by me today during her visit and there have been no prescriptions of controlled substances filled from any providers other than me. .Her pain is better controlled on the current regimen and she is staying active. [name]  Refills given for September .  October and November  have been authorized and sent          Other Visit Diagnoses    Vitamin D deficiency    -  Primary   Relevant Orders   VITAMIN D 25 Hydroxy (Vit-D Deficiency, Fractures)      I am having Neema A. Bogdon maintain her cetirizine, aspirin, triamcinolone cream, Fish Oil, Vitamin D3, albuterol, pantoprazole, clopidogrel, amLODipine, VITAMIN D PO, gabapentin, rosuvastatin, methocarbamol, albuterol, carvedilol, HYDROcodone-acetaminophen, HYDROcodone-acetaminophen, and HYDROcodone-acetaminophen.  Meds ordered this encounter  Medications  . HYDROcodone-acetaminophen (NORCO) 10-325 MG tablet    Sig: Take 1 tablet by mouth every 6 (six) hours as needed.    Dispense:  120 tablet    Refill:  0  . HYDROcodone-acetaminophen (NORCO) 10-325 MG tablet    Sig: Take 1 tablet by mouth every 6 (six) hours as needed.    Dispense:  120 tablet    Refill:  0  . HYDROcodone-acetaminophen (NORCO) 10-325 MG tablet    Sig: Take 1 tablet by mouth every 6 (six) hours as needed.    Dispense:  120 tablet    Refill:  0   I provided  30 minutes of  face-to-face time during this encounter reviewing patient's current  problems and past surgeries, labs and imaging studies, providing counseling on the above mentioned problems , and coordination  of  care .  Medications Discontinued During This Encounter  Medication Reason  . HYDROcodone-acetaminophen (NORCO) 10-325 MG tablet Reorder  . HYDROcodone-acetaminophen (NORCO) 10-325 MG tablet Reorder  . HYDROcodone-acetaminophen (NORCO) 10-325 MG tablet Reorder    Follow-up: No follow-ups on file.   [name], MD

## 2020-06-29 NOTE — Assessment & Plan Note (Signed)
She remains asymptomatic.  Continue plavix, crestor,  carvedilol

## 2020-06-29 NOTE — Assessment & Plan Note (Signed)
She was seen by Fletcher Anon for new onset dyspnea and his comments were as follows: Coronary artery disease involving native coronary arteries with angina equivalent, I am concerned that her exertional dyspnea is due to progressive coronary artery disease especially that her LAD stent was long and 2.5 in diameter and thus at high risk for restenosis.  I requested a Lexiscan Myoview with low threshold to proceed with cardiac catheterization given her symptoms.  I also requested an echocardiogram.  We have to control her blood pressure.

## 2020-06-29 NOTE — Assessment & Plan Note (Addendum)
She was prescribed hctz by cardiology but developed acute renal failure, so the medication was stopped   Continue amlodipine and carvedilol.  History of hyperkalemia on lisinopril

## 2020-06-29 NOTE — Patient Instructions (Addendum)
You have gained 20 lbs in one year!!  STOP EATING LIKE A TEENAGER   Choose dinners that are 'lean and green:"  Grilled fish , poulry, lamb, chicken or [place] A salad Green beans,  Broccoli,  Spinach,  Brussels sprout   NO MORE POTATOES  NO MORE BATTER FRIED FOODS NO MORE SODAs  GET IN THE POOL BEFORE 10 AM BEFORE IT GETS TOO HOT   RETURN IN AROUND 2 WEEKS FOR FASTING LABS   I have refilled your hydrocodone for Sept 2,  Oct 2 and Nov 1

## 2020-06-30 ENCOUNTER — Ambulatory Visit: Admit: 2020-06-30 | Payer: Medicare HMO | Admitting: Gastroenterology

## 2020-06-30 ENCOUNTER — Encounter
Admission: RE | Admit: 2020-06-30 | Discharge: 2020-06-30 | Disposition: A | Discharge status: Home / Self Care | Payer: Medicare HMO | Source: Ambulatory Visit | Attending: Cardiovascular Disease | Admitting: Cardiovascular Disease

## 2020-06-30 ENCOUNTER — Telehealth: Payer: Self-pay

## 2020-06-30 DIAGNOSIS — R0602 Shortness of breath: Secondary | ICD-10-CM | POA: Diagnosis not present

## 2020-06-30 LAB — NM MYOCAR MULTI W/SPECT W/WALL MOTION / EF
Estimated workload: 1 METS
Exercise duration (min): 0 min
Exercise duration (sec): 0 s
LV dias vol: 42 mL (ref 46–106)
LV sys vol: 10 mL
MPHR: 152 {beats}/min
Peak HR: 97 {beats}/min
Percent HR: 63 %
Rest HR: 67 {beats}/min
SDS: 0
SRS: 0
SSS: 0
TID: 0.97

## 2020-06-30 SURGERY — COLONOSCOPY WITH PROPOFOL
Anesthesia: General

## 2020-06-30 MED ORDER — REGADENOSON 0.4 MG/5ML IV SOLN
0.4000 mg | Freq: Once | INTRAVENOUS | Status: AC
  Administered 2020-06-30: 0.4 mg via INTRAVENOUS

## 2020-06-30 MED ORDER — TECHNETIUM TC 99M TETROFOSMIN IV KIT
30.3000 | PACK | Freq: Once | INTRAVENOUS | Status: AC | PRN
  Administered 2020-06-30: 30.3 via INTRAVENOUS

## 2020-06-30 MED ORDER — TECHNETIUM TC 99M TETROFOSMIN IV KIT
10.0000 | PACK | Freq: Once | INTRAVENOUS | Status: AC | PRN
  Administered 2020-06-30: 10.6 via INTRAVENOUS

## 2020-06-30 NOTE — Telephone Encounter (Signed)
Patient made aware of stress test results with verbalized understanding. 

## 2020-06-30 NOTE — Telephone Encounter (Signed)
-----   Message from Wellington Hampshire, MD sent at 06/30/2020  2:09 PM EDT ----- Inform patient that  stress test was normal.

## 2020-07-02 NOTE — Assessment & Plan Note (Signed)
Repeat carotid ultrasound in 2017 noted mild stable disease.  No follow up needed per cardiology

## 2020-07-02 NOTE — Assessment & Plan Note (Signed)
She has had drops in GFR due to use of NSAIDs and diuretics  Lab Results  Component Value Date   CREATININE 1.21 (H) 06/28/2020

## 2020-07-02 NOTE — Assessment & Plan Note (Signed)
Secondary to DDD and scoliosis managed with vicodin and methocarbamol  .  She has not had any ER visits  And has not requested any early refills.  Her Refill history was confirmed via [place] Controlled Substance database by me today during her visit and there have been no prescriptions of controlled substances filled from any providers other than me. .Her pain is better controlled on the current regimen and she is staying active. [name]  Refills given for September .  October and November  have been authorized and sent

## 2020-07-02 NOTE — Assessment & Plan Note (Signed)
She is at goal LDL of < 70 on crestor high dose for known CAD and PAD.LFTs are normal.   Lab Results  Component Value Date   CHOL 120 11/03/2019   HDL 49.00 11/03/2019   LDLCALC 35 11/03/2019   LDLDIRECT 47.0 03/14/2017   TRIG 180.0 (H) 11/03/2019   CHOLHDL 2 11/03/2019   Lab Results  Component Value Date   ALT 9 11/03/2019   AST 16 11/03/2019   ALKPHOS 69 11/03/2019   BILITOT 0.7 11/03/2019

## 2020-07-07 ENCOUNTER — Other Ambulatory Visit: Payer: Self-pay | Admitting: Internal Medicine

## 2020-07-11 ENCOUNTER — Other Ambulatory Visit: Payer: Self-pay

## 2020-07-11 ENCOUNTER — Ambulatory Visit (INDEPENDENT_AMBULATORY_CARE_PROVIDER_SITE_OTHER): Payer: Medicare HMO

## 2020-07-11 DIAGNOSIS — R0602 Shortness of breath: Secondary | ICD-10-CM

## 2020-07-11 LAB — ECHOCARDIOGRAM COMPLETE
Area-P 1/2: 2.91 cm2
S' Lateral: 2.6 cm

## 2020-07-12 ENCOUNTER — Other Ambulatory Visit (INDEPENDENT_AMBULATORY_CARE_PROVIDER_SITE_OTHER): Payer: Medicare HMO

## 2020-07-12 ENCOUNTER — Other Ambulatory Visit: Payer: Self-pay | Admitting: Family

## 2020-07-12 DIAGNOSIS — E559 Vitamin D deficiency, unspecified: Secondary | ICD-10-CM | POA: Diagnosis not present

## 2020-07-12 DIAGNOSIS — I2510 Atherosclerotic heart disease of native coronary artery without angina pectoris: Secondary | ICD-10-CM

## 2020-07-12 LAB — VITAMIN D 25 HYDROXY (VIT D DEFICIENCY, FRACTURES): VITD: 27.92 ng/mL — ABNORMAL LOW (ref 30.00–100.00)

## 2020-07-12 LAB — LIPID PANEL
Cholesterol: 105 mg/dL (ref 0–200)
HDL: 46.8 mg/dL (ref >39.00)
LDL Cholesterol: 25 mg/dL (ref 0–99)
NonHDL: 58.45
Total CHOL/HDL Ratio: 2
Triglycerides: 168 mg/dL — ABNORMAL HIGH (ref 0.0–149.0)
VLDL: 33.6 mg/dL (ref 0.0–40.0)

## 2020-07-13 ENCOUNTER — Telehealth: Payer: Self-pay
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
## 2020-07-13 NOTE — Telephone Encounter (Signed)
-----   Message from Wellington Hampshire, MD sent at 07/13/2020  1:00 PM EDT ----- Inform patient that echo was fine.  Normal ejection fraction with no significant valvular abnormalities. The diastolic function was reported to be abnormal but I reviewed the study myself and I think it was indeterminate.  Overall good report.

## 2020-07-13 NOTE — Telephone Encounter (Signed)
Called to give the patient echo results. [email or web address]. 

## 2020-07-14 NOTE — Telephone Encounter (Signed)
DPR on file. lmom with echo results. Patient is to contact the office if any questions.

## 2020-07-19 ENCOUNTER — Ambulatory Visit: Payer: Medicare HMO | Admitting: Physician Assistant

## 2020-08-02 ENCOUNTER — Other Ambulatory Visit: Payer: Self-pay

## 2020-08-02 ENCOUNTER — Telehealth: Payer: Self-pay

## 2020-08-02 DIAGNOSIS — R195 Other fecal abnormalities: Secondary | ICD-10-CM

## 2020-08-02 NOTE — Telephone Encounter (Signed)
   Primary Cardiologist: Kathlyn Sacramento, MD  Chart reviewed as part of pre-operative protocol coverage.   Hx of CAD s/p LAD PCI with drug-eluting stent in January 2016.  She is known to have RCA CTO being managed medically.  Most recently seen by Dr. Fletcher Anon 06/20/20 and noted exertional dyspnea with anginal equivalent. Follow up stress test low risk. Echo with preserved LVEF and grade II DD.   Left voice mail to call back to go over symptoms.   Dr. Fletcher Anon, Can patient hold Plavix and any other recommendations? Please forward your response to P [facility] PREOP.   Thank you    Leanor Kail, PA  08/02/2020, 4:01 PM

## 2020-08-02 NOTE — Telephone Encounter (Signed)
   [facility] Pre-operative Risk Assessment    HEARTCARE STAFF: - Please ensure there is not already an duplicate clearance open for this procedure. - Under Visit Info/Reason for Call, type in Other and utilize the format Clearance MM/DD/YY or Clearance TBD. Do not use dashes or single digits. - If request is for dental extraction, please clarify the # of teeth to be extracted.  Request for surgical clearance:  1. What type of surgery is being performed?  Colonoscopy   2. When is this surgery scheduled?  09/01/20   3. What type of clearance is required (medical clearance vs. Pharmacy clearance to hold med vs. Both)? Both  4. Are there any medications that need to be held prior to surgery and how long? Plavix 75 mg, 5 day hold   5. Practice name and name of physician performing surgery? [facility],  Dr. [name]   6. What is the office phone number? [phone]   7.   What is the office fax number? [phone]  8.   Anesthesia type (None, local, MAC, general) ? General   Suzanne Cole 08/02/2020, 3:34 PM  _________________________________________________________________   (provider comments below)

## 2020-08-02 NOTE — Telephone Encounter (Signed)
Low risk for colonoscopy.  Can hold Plavix 5 days before.

## 2020-08-19 ENCOUNTER — Other Ambulatory Visit: Payer: Self-pay | Admitting: Internal Medicine

## 2020-08-29 ENCOUNTER — Other Ambulatory Visit: Payer: Self-pay | Admitting: Physician Assistant

## 2020-08-29 ENCOUNTER — Telehealth: Payer: Self-pay

## 2020-08-29 DIAGNOSIS — I25810 Atherosclerosis of coronary artery bypass graft(s) without angina pectoris: Secondary | ICD-10-CM

## 2020-08-29 NOTE — Telephone Encounter (Signed)
Returned patients call. LVM to call office back. 

## 2020-08-30 ENCOUNTER — Other Ambulatory Visit
Admission: RE | Admit: 2020-08-30 | Discharge: 2020-08-30 | Disposition: A | Discharge status: Home / Self Care | Payer: Medicare HMO | Source: Ambulatory Visit | Attending: Gastroenterology | Admitting: Gastroenterology

## 2020-08-30 ENCOUNTER — Other Ambulatory Visit: Payer: Self-pay

## 2020-08-30 DIAGNOSIS — Z01812 Encounter for preprocedural laboratory examination: Secondary | ICD-10-CM | POA: Diagnosis not present

## 2020-08-30 DIAGNOSIS — Z20822 Contact with and (suspected) exposure to covid-19: Secondary | ICD-10-CM | POA: Diagnosis not present

## 2020-08-31 LAB — SARS CORONAVIRUS 2 (TAT 6-24 HRS): SARS Coronavirus 2: NEGATIVE

## 2020-09-01 ENCOUNTER — Encounter: Payer: Self-pay | Admitting: Gastroenterology

## 2020-09-01 ENCOUNTER — Encounter
Admission: RE | Disposition: A | Discharge status: Home / Self Care | Payer: Self-pay | Source: Home / Self Care | Attending: Gastroenterology

## 2020-09-01 ENCOUNTER — Ambulatory Visit
Admission: RE | Admit: 2020-09-01 | Discharge: 2020-09-01 | Disposition: A | Discharge status: Home / Self Care | Payer: Medicare HMO | Attending: Gastroenterology | Admitting: Gastroenterology

## 2020-09-01 ENCOUNTER — Ambulatory Visit: Payer: Medicare HMO | Admitting: Anesthesiology

## 2020-09-01 DIAGNOSIS — I2510 Atherosclerotic heart disease of native coronary artery without angina pectoris: Secondary | ICD-10-CM | POA: Diagnosis not present

## 2020-09-01 DIAGNOSIS — Z905 Acquired absence of kidney: Secondary | ICD-10-CM | POA: Diagnosis not present

## 2020-09-01 DIAGNOSIS — Z5309 Procedure and treatment not carried out because of other contraindication: Secondary | ICD-10-CM | POA: Insufficient documentation

## 2020-09-01 DIAGNOSIS — Z85528 Personal history of other malignant neoplasm of kidney: Secondary | ICD-10-CM | POA: Insufficient documentation

## 2020-09-01 DIAGNOSIS — Z955 Presence of coronary angioplasty implant and graft: Secondary | ICD-10-CM | POA: Insufficient documentation

## 2020-09-01 DIAGNOSIS — I509 Heart failure, unspecified: Secondary | ICD-10-CM | POA: Diagnosis not present

## 2020-09-01 DIAGNOSIS — I110 Hypertensive heart disease with heart failure: Secondary | ICD-10-CM | POA: Insufficient documentation

## 2020-09-01 DIAGNOSIS — I252 Old myocardial infarction: Secondary | ICD-10-CM | POA: Insufficient documentation

## 2020-09-01 DIAGNOSIS — E785 Hyperlipidemia, unspecified: Secondary | ICD-10-CM | POA: Diagnosis not present

## 2020-09-01 DIAGNOSIS — R195 Other fecal abnormalities: Secondary | ICD-10-CM | POA: Insufficient documentation

## 2020-09-01 DIAGNOSIS — I5030 Unspecified diastolic (congestive) heart failure: Secondary | ICD-10-CM | POA: Insufficient documentation

## 2020-09-01 DIAGNOSIS — Z79899 Other long term (current) drug therapy: Secondary | ICD-10-CM | POA: Diagnosis not present

## 2020-09-01 DIAGNOSIS — K5730 Diverticulosis of large intestine without perforation or abscess without bleeding: Secondary | ICD-10-CM | POA: Insufficient documentation

## 2020-09-01 DIAGNOSIS — K219 Gastro-esophageal reflux disease without esophagitis: Secondary | ICD-10-CM | POA: Diagnosis not present

## 2020-09-01 DIAGNOSIS — Z87891 Personal history of nicotine dependence: Secondary | ICD-10-CM | POA: Insufficient documentation

## 2020-09-01 HISTORY — PX: COLONOSCOPY WITH PROPOFOL: SHX5780

## 2020-09-01 SURGERY — COLONOSCOPY WITH PROPOFOL
Anesthesia: General

## 2020-09-01 MED ORDER — SODIUM CHLORIDE 0.9 % IV SOLN: INTRAVENOUS | Status: DC

## 2020-09-01 MED ORDER — PROPOFOL 10 MG/ML IV BOLUS
INTRAVENOUS | Status: DC | PRN
  Administered 2020-09-01: 70 mg via INTRAVENOUS
  Administered 2020-09-01: 30 mg via INTRAVENOUS

## 2020-09-01 MED ORDER — PROPOFOL 500 MG/50ML IV EMUL
INTRAVENOUS | Status: DC | PRN
  Administered 2020-09-01: 140 ug/kg/min via INTRAVENOUS

## 2020-09-01 NOTE — Transfer of Care (Signed)
Immediate Anesthesia Transfer of Care Note  Patient: Suzanne Cole  Procedure(s) Performed: COLONOSCOPY WITH PROPOFOL (N/A )  Patient Location: PACU  Anesthesia Type:General  Level of Consciousness: sedated  Airway & Oxygen Therapy: Patient Spontanous Breathing and Patient connected to nasal cannula oxygen  Post-op Assessment: Report given to RN and Post -op Vital signs reviewed and stable  Post vital signs: Reviewed and stable  Last Vitals:  Vitals Value Taken Time  BP 114/46 09/01/20 1018  Temp    Pulse 72 09/01/20 1018  Resp 19 09/01/20 1018  SpO2 97 % 09/01/20 1018  Vitals shown include unvalidated device data.  Last Pain:  Vitals:   09/01/20 0923  TempSrc: Temporal  PainSc: 0-No pain         Complications: No complications documented.

## 2020-09-01 NOTE — Op Note (Signed)
[facility] Patient Name: Suzanne Cole Procedure Date: 09/01/2020 9:51 AM MRN: [id number] Account #: [id number] Date of Birth: [birth year] Admit Type: Outpatient Age: 69 Room: [facility] ENDO ROOM 4 Gender: Female Note Status: Finalized Procedure:             Colonoscopy Indications:           Positive Cologuard test Providers:             [name] MD, MD Referring MD:          Deborra Medina, MD (Referring MD) Medicines:             Monitored Anesthesia Care Complications:         No immediate complications. Procedure:             Pre-Anesthesia Assessment:                        - Prior to the procedure, a History and Physical was                         performed, and patient medications, allergies and                         sensitivities were reviewed. The patient's tolerance                         of previous anesthesia was reviewed.                        - The risks and benefits of the procedure and the                         sedation options and risks were discussed with the                         patient. All questions were answered and informed                         consent was obtained.                        - ASA Grade Assessment: II - A patient with mild                         systemic disease.                        After obtaining informed consent, the colonoscope was                         passed under direct vision. Throughout the procedure,                         the patient's blood pressure, pulse, and oxygen                         saturations were monitored continuously. The                         Colonoscope was introduced through the anus with the  intention of advancing to the cecum. The scope was                         advanced to the sigmoid colon before the procedure was                         aborted. Medications were given. The Colonoscope was                         introduced through  the anus with the intention of                         advancing to the cecum. The scope was advanced to the                         sigmoid colon before the procedure was aborted.                         Medications were given. The colonoscopy was                         technically difficult and complex due to significant                         looping. The patient tolerated the procedure well. The                         quality of the bowel preparation was good. Findings:      The perianal and digital rectal examinations were normal.      Multiple small-mouthed diverticula were found in the sigmoid colon.       There was no evidence of diverticular bleeding. Impression:            - Diverticulosis in the sigmoid colon. There was no                         evidence of diverticular bleeding.                        - No specimens collected. Recommendation:        - Discharge patient to home (with escort).                        - Resume previous diet.                        - Continue present medications.                        - Perform a virtual colonoscopy in 2 weeks. Procedure Code(s):     --- Professional ---                        [phone], 53, Colonoscopy, flexible; diagnostic,                         including collection of specimen(s) by brushing or  washing, when performed (separate procedure) Diagnosis Code(s):     --- Professional ---                        R19.5, Other fecal abnormalities                        K57.30, Diverticulosis of large intestine without                         perforation or abscess without bleeding CPT copyright 2019 American Medical Association. All rights reserved. The codes documented in this report are preliminary and upon coder review may  be revised to meet current compliance requirements. [name], MD [name] MD, MD 09/01/2020 10:15:09 AM This report has been signed electronically. Number of Addenda: 0 Note  Initiated On: 09/01/2020 9:51 AM Total Procedure Duration: 0 hours 10 minutes 17 seconds  Estimated Blood Loss:  Estimated blood loss: none.      [facility]
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
## 2020-09-01 NOTE — Anesthesia Postprocedure Evaluation (Signed)
Anesthesia Post Note  Patient: [place] A Elster  Procedure(s) Performed: COLONOSCOPY WITH PROPOFOL (N/A )  Patient location during evaluation: Endoscopy Anesthesia Type: General Level of consciousness: awake and alert and oriented Pain management: pain level controlled Vital Signs Assessment: post-procedure vital signs reviewed and stable Respiratory status: spontaneous breathing, nonlabored ventilation and respiratory function stable Cardiovascular status: blood pressure returned to baseline and stable Postop Assessment: no signs of nausea or vomiting Anesthetic complications: no   No complications documented.   Last Vitals:  Vitals:   09/01/20 1020 09/01/20 1030  BP: (!) 114/46 (!) 128/46  Pulse: 72 67  Resp: 20 19  Temp:    SpO2: 97% 98%    Last Pain:  Vitals:   09/01/20 1010  TempSrc: Temporal  PainSc:                  Jarion Hawthorne
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
## 2020-09-21 ENCOUNTER — Other Ambulatory Visit: Payer: Self-pay

## 2020-09-21 DIAGNOSIS — Z1211 Encounter for screening for malignant neoplasm of colon: Secondary | ICD-10-CM

## 2020-09-21 DIAGNOSIS — R195 Other fecal abnormalities: Secondary | ICD-10-CM

## 2020-09-21 NOTE — Progress Notes (Signed)
Referred patient to [facility] Radiology per Dr. Vicente Males request. Order has been faxed over.

## 2020-10-03 DIAGNOSIS — H52229 Regular astigmatism, unspecified eye: Secondary | ICD-10-CM | POA: Diagnosis not present

## 2020-10-04 ENCOUNTER — Other Ambulatory Visit: Payer: Self-pay | Admitting: Family

## 2020-10-09 ENCOUNTER — Telehealth (INDEPENDENT_AMBULATORY_CARE_PROVIDER_SITE_OTHER): Payer: Medicare HMO | Admitting: Internal Medicine

## 2020-10-09 ENCOUNTER — Encounter: Payer: Self-pay | Admitting: Internal Medicine

## 2020-10-09 DIAGNOSIS — M5416 Radiculopathy, lumbar region: Secondary | ICD-10-CM | POA: Diagnosis not present

## 2020-10-09 DIAGNOSIS — R195 Other fecal abnormalities: Secondary | ICD-10-CM | POA: Diagnosis not present

## 2020-10-09 DIAGNOSIS — K222 Esophageal obstruction: Secondary | ICD-10-CM | POA: Diagnosis not present

## 2020-10-09 DIAGNOSIS — I700 Atherosclerosis of aorta: Secondary | ICD-10-CM

## 2020-10-09 DIAGNOSIS — K219 Gastro-esophageal reflux disease without esophagitis: Secondary | ICD-10-CM

## 2020-10-09 DIAGNOSIS — E785 Hyperlipidemia, unspecified: Secondary | ICD-10-CM

## 2020-10-09 MED ORDER — HYDROCODONE-ACETAMINOPHEN 10-325 MG PO TABS
1.0000 | ORAL_TABLET | Freq: Four times a day (QID) | ORAL | 0 refills | Status: DC | PRN
Start: 2020-10-11 — End: 2021-01-10

## 2020-10-09 MED ORDER — HYDROCODONE-ACETAMINOPHEN 10-325 MG PO TABS: 1.0000 | ORAL_TABLET | Freq: Four times a day (QID) | ORAL | 0 refills | Status: DC | PRN

## 2020-10-09 MED ORDER — HYDROCODONE-ACETAMINOPHEN 10-325 MG PO TABS
1.0000 | ORAL_TABLET | Freq: Four times a day (QID) | ORAL | 0 refills | Status: DC | PRN
Start: 2020-12-10 — End: 2021-01-10

## 2020-10-09 NOTE — Assessment & Plan Note (Addendum)
Diagnostic colonoscopy was aborted at the sigmoid colon due to tortuosity and she has been scheduled for a virtual colonoscopy in Jan 2022 .  She has no symptoms of colon Ca: discussed the fact tht colon CA does not cause symptoms until it is quite advanced ; encouraged to keep the virtual colonoscopy appointment

## 2020-10-09 NOTE — Assessment & Plan Note (Signed)
She is at goal LDL of < 70 on crestor high dose for known CAD and PAD.LFTs are normal.   Lab Results  Component Value Date   CHOL 105 07/12/2020   HDL 46.80 07/12/2020   LDLCALC 25 07/12/2020   LDLDIRECT 47.0 03/14/2017   TRIG 168.0 (H) 07/12/2020   CHOLHDL 2 07/12/2020   Lab Results  Component Value Date   ALT 9 11/03/2019   AST 16 11/03/2019   ALKPHOS 69 11/03/2019   BILITOT 0.7 11/03/2019

## 2020-10-09 NOTE — Assessment & Plan Note (Signed)
No esophageal symptoms with daily use of Protonix
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
## 2020-10-09 NOTE — Assessment & Plan Note (Signed)
Secondary to DDD and scoliosis managed with vicodin and methocarbamol  .  She has not had any ER visits  And has not requested any early refills.  Her Refill history was confirmed via [place] Controlled Substance database by me today during her visit and there have been no prescriptions of controlled substances filled from any providers other than me. .Her pain is better controlled on the current regimen and she is staying active. [name]  Refills given for December, January and February have been authorized and sent

## 2020-10-09 NOTE — Assessment & Plan Note (Signed)
She is at goal LDL of < 70 on crestor high dose .LFTs are normal.   Lab Results  Component Value Date   CHOL 105 07/12/2020   HDL 46.80 07/12/2020   LDLCALC 25 07/12/2020   LDLDIRECT 47.0 03/14/2017   TRIG 168.0 (H) 07/12/2020   CHOLHDL 2 07/12/2020   Lab Results  Component Value Date   ALT 9 11/03/2019   AST 16 11/03/2019   ALKPHOS 69 11/03/2019   BILITOT 0.7 11/03/2019

## 2020-10-19 MED ORDER — FLUTICASONE PROPIONATE 50 MCG/ACT NA SUSP: 2.0000 | Freq: Every day | NASAL | 6 refills | Status: DC

## 2020-10-19 NOTE — Telephone Encounter (Signed)
Not in pt's medication list.

## 2020-10-23 ENCOUNTER — Other Ambulatory Visit: Payer: Self-pay | Admitting: Family

## 2020-10-24 NOTE — Telephone Encounter (Signed)
90 day supply with 0 refills provided as she is overdue for follow up.   Loel Dubonnet, NP

## 2020-10-27 DIAGNOSIS — Z1231 Encounter for screening mammogram for malignant neoplasm of breast: Secondary | ICD-10-CM | POA: Diagnosis not present

## 2020-10-27 LAB — HM MAMMOGRAPHY

## 2020-11-15 LAB — HM CT VIRTUAL COLONOSCOPY

## 2020-11-17 ENCOUNTER — Ambulatory Visit
Admission: RE | Admit: 2020-11-17 | Discharge: 2020-11-17 | Disposition: A | Discharge status: Home / Self Care | Payer: Medicare HMO | Source: Ambulatory Visit | Attending: Gastroenterology | Admitting: Gastroenterology

## 2020-11-17 DIAGNOSIS — Z85528 Personal history of other malignant neoplasm of kidney: Secondary | ICD-10-CM | POA: Diagnosis not present

## 2020-11-17 DIAGNOSIS — N281 Cyst of kidney, acquired: Secondary | ICD-10-CM | POA: Diagnosis not present

## 2020-11-17 DIAGNOSIS — Z1211 Encounter for screening for malignant neoplasm of colon: Secondary | ICD-10-CM

## 2020-11-17 DIAGNOSIS — J341 Cyst and mucocele of nose and nasal sinus: Secondary | ICD-10-CM | POA: Diagnosis not present

## 2020-11-17 DIAGNOSIS — K5730 Diverticulosis of large intestine without perforation or abscess without bleeding: Secondary | ICD-10-CM | POA: Diagnosis not present

## 2020-11-23 ENCOUNTER — Telehealth: Payer: Self-pay | Admitting: Gastroenterology

## 2020-11-23 NOTE — Progress Notes (Signed)
I received a message from your gastroenterologist about your virtual colonoscopy. An incidental finding of a 1.7 cm cyst on your kidney was found, and the radiologist has recommended an MRI for further evaluation, along with a urology consult.  Please confirm you are willing to have these done .  Regards,   Deborra Medina, MD

## 2020-11-23 NOTE — Telephone Encounter (Signed)
Patient wants to discuss results, please call

## 2020-11-23 NOTE — Telephone Encounter (Signed)
Returned patient's call regarding virtual colonoscopy results. Informed patient I will send Dr. Vicente Males a message regarding this matter. Pt verbalized understanding.

## 2020-11-24 NOTE — Telephone Encounter (Signed)
Called to inform patient of the results from the virtual colonoscopy. Pt verbalized understanding.

## 2020-11-24 NOTE — Telephone Encounter (Signed)
-----   Message from [name], MD sent at 11/23/2020  2:19 PM EST ----- Suzanne Cole please Inform  Diverticulosis of the colon noted.  This is the reason why we could not complete the colonoscopy.  No polyps or cancerous lesions noted.  There is a 1.7 cm cyst seen on the kidney.  Radiologist has suggested an MRI of the abdomen with and without contrast to evaluate further and would recommend to have it performed  C/c Suzanne Mc, MD   Dr [name] MD,MRCP [facility]) Gastroenterology/Hepatology Pager: [phone]

## 2020-12-27 ENCOUNTER — Other Ambulatory Visit: Payer: Self-pay | Admitting: Family

## 2020-12-27 NOTE — Telephone Encounter (Signed)
Review with Laurann Montana, NP.

## 2020-12-28 ENCOUNTER — Other Ambulatory Visit: Payer: Self-pay | Admitting: Family

## 2020-12-28 MED ORDER — PANTOPRAZOLE SODIUM 40 MG PO TBEC
40.0000 mg | DELAYED_RELEASE_TABLET | Freq: Every day | ORAL | 0 refills | Status: DC
Start: 2020-12-28 — End: 2021-01-24

## 2021-01-01 ENCOUNTER — Other Ambulatory Visit: Payer: Self-pay | Admitting: Family

## 2021-01-01 NOTE — Telephone Encounter (Signed)
30 day fill sent 12/28/20. Not appropriate for repeat refill at this time. Further refills will need to come from her PCP, Dr. Derrel Nip.   Thank you! Loel Dubonnet, NP

## 2021-01-01 NOTE — Telephone Encounter (Signed)
Caitlin, Can you please advise refill on this medication.   Looks like patient was seen by Dr. Fletcher Anon 06/2020 - it was not on her list.   Looks like it was sent in 12/28/2020 -  pantoprazole (PROTONIX) 40 MG tablet 30 tablet 0 12/28/2020    Sig - Route: Take 1 tablet (40 mg total) by mouth daily. - Oral   Sent to pharmacy as: pantoprazole (PROTONIX) 40 MG tablet   Notes to Pharmacy: Provider will refill for 30 days only, then pt needs to have refills sent by PCP.   E-Prescribing Status: Receipt confirmed by pharmacy (12/28/2020 11:17 AM EST)     [place], [place]

## 2021-01-10 ENCOUNTER — Ambulatory Visit (INDEPENDENT_AMBULATORY_CARE_PROVIDER_SITE_OTHER): Payer: Medicare HMO | Admitting: Internal Medicine

## 2021-01-10 ENCOUNTER — Encounter: Payer: Self-pay | Admitting: Internal Medicine

## 2021-01-10 ENCOUNTER — Other Ambulatory Visit: Payer: Self-pay

## 2021-01-10 VITALS — BP 116/56 | HR 91 | Temp 98.3°F | Resp 16 | Ht 59.0 in | Wt 156.0 lb

## 2021-01-10 DIAGNOSIS — E785 Hyperlipidemia, unspecified: Secondary | ICD-10-CM

## 2021-01-10 DIAGNOSIS — I5030 Unspecified diastolic (congestive) heart failure: Secondary | ICD-10-CM | POA: Diagnosis not present

## 2021-01-10 DIAGNOSIS — I10 Essential (primary) hypertension: Secondary | ICD-10-CM

## 2021-01-10 DIAGNOSIS — N183 Chronic kidney disease, stage 3 unspecified: Secondary | ICD-10-CM | POA: Diagnosis not present

## 2021-01-10 DIAGNOSIS — R7301 Impaired fasting glucose: Secondary | ICD-10-CM

## 2021-01-10 DIAGNOSIS — E66811 Obesity, class 1: Secondary | ICD-10-CM

## 2021-01-10 DIAGNOSIS — R195 Other fecal abnormalities: Secondary | ICD-10-CM | POA: Diagnosis not present

## 2021-01-10 DIAGNOSIS — I130 Hypertensive heart and chronic kidney disease with heart failure and stage 1 through stage 4 chronic kidney disease, or unspecified chronic kidney disease: Secondary | ICD-10-CM

## 2021-01-10 DIAGNOSIS — Z1211 Encounter for screening for malignant neoplasm of colon: Secondary | ICD-10-CM | POA: Diagnosis not present

## 2021-01-10 DIAGNOSIS — R1114 Bilious vomiting: Secondary | ICD-10-CM

## 2021-01-10 DIAGNOSIS — Z23 Encounter for immunization: Secondary | ICD-10-CM | POA: Diagnosis not present

## 2021-01-10 DIAGNOSIS — N281 Cyst of kidney, acquired: Secondary | ICD-10-CM

## 2021-01-10 DIAGNOSIS — E559 Vitamin D deficiency, unspecified: Secondary | ICD-10-CM | POA: Diagnosis not present

## 2021-01-10 DIAGNOSIS — E669 Obesity, unspecified: Secondary | ICD-10-CM

## 2021-01-10 DIAGNOSIS — M5416 Radiculopathy, lumbar region: Secondary | ICD-10-CM

## 2021-01-10 LAB — COMPREHENSIVE METABOLIC PANEL
ALT: 11 U/L (ref 0–35)
AST: 16 U/L (ref 0–37)
Albumin: 4 g/dL (ref 3.5–5.2)
Alkaline Phosphatase: 61 U/L (ref 39–117)
BUN: 16 mg/dL (ref 6–23)
CO2: 25 mEq/L (ref 19–32)
Calcium: 8.7 mg/dL (ref 8.4–10.5)
Chloride: 100 mEq/L (ref 96–112)
Creatinine, Ser: 1.06 mg/dL (ref 0.40–1.20)
GFR: 53.64 mL/min — ABNORMAL LOW (ref >60.00)
Glucose, Bld: 109 mg/dL — ABNORMAL HIGH (ref 70–99)
Potassium: 4.8 mEq/L (ref 3.5–5.1)
Sodium: 133 mEq/L — ABNORMAL LOW (ref 135–145)
Total Bilirubin: 0.7 mg/dL (ref 0.2–1.2)
Total Protein: 7 g/dL (ref 6.0–8.3)

## 2021-01-10 LAB — LIPID PANEL
Cholesterol: 160 mg/dL (ref 0–200)
HDL: 44.3 mg/dL (ref >39.00)
NonHDL: 115.76
Total CHOL/HDL Ratio: 4
Triglycerides: 246 mg/dL — ABNORMAL HIGH (ref 0.0–149.0)
VLDL: 49.2 mg/dL — ABNORMAL HIGH (ref 0.0–40.0)

## 2021-01-10 LAB — LDL CHOLESTEROL, DIRECT: Direct LDL: 72 mg/dL

## 2021-01-10 LAB — HEMOGLOBIN A1C: Hgb A1c MFr Bld: 6.1 % (ref 4.6–6.5)

## 2021-01-10 MED ORDER — HYDROCODONE-ACETAMINOPHEN 10-325 MG PO TABS: 1.0000 | ORAL_TABLET | Freq: Four times a day (QID) | ORAL | 0 refills | Status: DC | PRN

## 2021-01-10 NOTE — Progress Notes (Signed)
Subjective:  Patient ID: Suzanne Cole, female    DOB: [birth year]  Age: 70 y.o. MRN: [id number]  CC: The primary encounter diagnosis was Need for immunization against influenza. 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  HPI Suzanne Cole presents for follow up on chronic pain secondary to severe scoliosis and DDD,  Hypertension and hyperlipidemia.    This visit occurred during the SARS-CoV-2 public health emergency.  Safety protocols were in place, including screening questions prior to the visit, additional usage of staff PPE, and extensive cleaning of exam room while observing appropriate contact time as indicated for disinfecting solutions.   1) Since her last visit she was referred for colonoscopy due to a positive Cologuard test.  The procedure was done in October with great difficulty due to looping and she had to have a virtual colonoscopy to complete the evaluation.  No polyps were found, and diverticulosis was noted.  She remains very aggravated because she requested a virtual colonoscopy from the start and feels she was inconvenienced both physically and financially  (she had to spend >$600 out of pocket).  2) An incidental finding of a 1.7 cm renal cyst on  Her left kidney (she is s/p right nephrectomy remotely for renal cell CA) was noted and MRI was advised by radiologist but has not been done    3) Weight gain/obesity:  She has noted her weigh gain and has already changed diet .  Less junk food.  husband works 3rd shift which makes it difficult for her to eat at the time she feels hungry.   .  She eats a very small Breakfast:   Lunch is a  dried cranberry/chocolate/graham crackers snack that is  150 cal.     Dinner is baked [place] or chicken with rice  And peas , brussel sprouts.   Using the cubii   For exercise because she is unable to walk without a cane due to bilateral hip pain. She is not requesting an increase in her opioids and tries to go without them for as long as possible .  4) HTN:  Patient is taking her medications as prescribed and notes no adverse effects.  Home BP readings have been done about once per week and are  generally < 140/80 .  She is avoiding added salt in her diet     Outpatient Medications Prior to Visit  Medication Sig Dispense Refill  . albuterol (PROVENTIL) (2.5 MG/3ML) 0.083% nebulizer solution Take 3 mLs (2.5 mg total) by nebulization every 6 (six) hours as needed for wheezing or shortness of breath. 150 mL 1  . albuterol (VENTOLIN HFA) 108 (90 Base) MCG/ACT inhaler Inhale 2 puffs into the lungs every 6 (six) hours as needed for wheezing or shortness of breath. FILL WITH PROAIR PER INSURANCE 18 g 2  . amLODipine (NORVASC) 10 MG tablet TAKE 1 TABLET(10 MG) BY MOUTH DAILY FOR HIGH BLOOD PRESSURE 90 tablet 1  . aspirin 81 MG tablet Take 81 mg by mouth as needed for pain.    . cetirizine (ZYRTEC) 10 MG tablet Take 10 mg by mouth daily.    . Cholecalciferol (VITAMIN D3) 1000 units CHEW  Chew by mouth daily.     . clopidogrel (PLAVIX) 75 MG tablet TAKE 1 TABLET BY MOUTH ONCE DAILY 90 tablet 0  . fluticasone (FLONASE) 50 MCG/ACT nasal spray Place 2 sprays into both nostrils daily. 16 g 6  . gabapentin (NEURONTIN) 300 MG capsule TAKE 1 CAPSULE(300 MG) BY MOUTH THREE TIMES DAILY 270 capsule 1  . methocarbamol (ROBAXIN) 750 MG tablet TAKE 1 TABLET(750 MG) BY MOUTH FOUR TIMES DAILY 360 tablet 1  . Omega-3 Fatty Acids (FISH OIL) 1000 MG CAPS Take 3 capsules (3,000 mg total) by mouth daily. 90 capsule 0  . pantoprazole (PROTONIX) 40 MG tablet Take 1 tablet (40 mg  total) by mouth daily. 30 tablet 0  . rosuvastatin (CRESTOR) 40 MG tablet Take 1 tablet (40 mg total) by mouth daily. Please schedule follow up appointment for further refills. 90 tablet 0  . triamcinolone cream (KENALOG) 0.1 % Apply 1 application topically 2 (two) times daily. 30 g 0  . VITAMIN D PO Take 1 capsule by mouth daily. Take 5,000 IUs daily.    [name] HYDROcodone-acetaminophen (NORCO) 10-325 MG tablet Take 1 tablet by mouth every 6 (six) hours as needed. 120 tablet 0  . HYDROcodone-acetaminophen (NORCO) 10-325 MG tablet Take 1 tablet by mouth every 6 (six) hours as needed. 120 tablet 0  . HYDROcodone-acetaminophen (NORCO) 10-325 MG tablet Take 1 tablet by mouth every 6 (six) hours as needed. 120 tablet 0  . carvedilol (COREG) 25 MG tablet Take 1 tablet (25 mg total) by mouth 2 (two) times daily. 180 tablet 3   No facility-administered medications prior to visit.    Review of Systems;  Patient denies headache, fevers, malaise, unintentional weight loss, skin rash, eye pain, sinus congestion and sinus pain, sore throat, dysphagia,  hemoptysis , cough, dyspnea, wheezing, chest pain, palpitations, orthopnea, edema, abdominal pain, nausea, melena, diarrhea, constipation, flank pain, dysuria, hematuria, urinary  Frequency, nocturia, numbness, tingling, seizures,  Focal weakness, Loss of consciousness,  Tremor, insomnia, depression, anxiety, and suicidal ideation.      Objective:  BP (!) 116/56 (BP Location: Left Arm, Patient Position: Sitting, Cuff Size: Large)   Pulse 91   Temp 98.3 F (36.8 C) (Oral)   Resp 16   Ht 4\' 11"  (1.499 m)   Wt 156 lb (70.8 kg)   SpO2 98%   BMI 31.51 kg/m   BP Readings from Last 3 Encounters:  01/10/21 (!) 116/56  09/01/20 (!) 128/46  06/29/20 136/76    Wt Readings from Last 3 Encounters:  01/10/21 156 lb (70.8 kg)  10/09/20 150 lb (68 kg)  09/01/20 150 lb (68 kg)    General appearance: alert, cooperative and appears stated age Ears: normal TM's  and external ear canals both ears Throat: lips, mucosa, and tongue normal; teeth and gums normal Neck: no adenopathy, no carotid bruit, supple, symmetrical, trachea midline and thyroid not enlarged, symmetric, no tenderness/mass/nodules Back: symmetric, no curvature. ROM normal. No CVA tenderness. Lungs: clear to auscultation bilaterally Heart: regular rate and rhythm, S1, S2 normal, no murmur, click, rub or gallop Abdomen: soft, non-tender; bowel sounds normal; no masses,  no organomegaly Pulses: 2+ and symmetric Skin: Skin color, texture, turgor normal. No rashes or lesions Lymph nodes: Cervical, supraclavicular, and axillary nodes normal.  Lab Results  Component Value Date   HGBA1C 6.1 01/10/2021   HGBA1C 5.6 03/14/2017   HGBA1C 5.7 11/09/2014    Lab Results  Component Value Date   CREATININE 1.06 01/10/2021   CREATININE 1.21 (  H) 06/28/2020   CREATININE 0.90 11/03/2019    Lab Results  Component Value Date   WBC 8.6 01/29/2018   HGB 12.0 01/29/2018   HCT 35.7 (L) 01/29/2018   PLT 316.0 01/29/2018   GLUCOSE 109 (H) 01/10/2021   CHOL 160 01/10/2021   TRIG 246.0 (H) 01/10/2021   HDL 44.30 01/10/2021   LDLDIRECT 72.0 01/10/2021   LDLCALC 25 07/12/2020   ALT 11 01/10/2021   AST 16 01/10/2021   NA 133 (L) 01/10/2021   K 4.8 01/10/2021   CL 100 01/10/2021   CREATININE 1.06 01/10/2021   BUN 16 01/10/2021   CO2 25 01/10/2021   TSH 1.29 08/03/2015   INR 0.9 11/28/2014   HGBA1C 6.1 01/10/2021   MICROALBUR <0.7 12/13/2016    CT VIRTUAL COLONOSCOPY DIAGNOSTIC  Result Date: 11/17/2020 CLINICAL DATA:  Incomplete colonoscopy, positive Cologuard test. 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 EXAM: CT VIRTUAL COLONOSCOPY DIAGNOSTIC TECHNIQUE: The patient was given a standard Mag citrate bowel preparation with Gastrografin and barium for fluid and stool tagging respectively. The quality of the bowel preparation is moderate. Automated CO2 insufflation of the colon  was performed prior to image acquisition and colonic distention is moderate. Image post processing was used to generate a 3D endoluminal fly-through projection of the colon and to electronically subtract stool/fluid as appropriate. COMPARISON:  None. FINDINGS: VIRTUAL COLONOSCOPY Mild layering fluid/contrast in the colon, which shifts positions on supine/prone imaging, and does not constraining evaluation. No significant colonic polyp, mass, apple core lesion, or stricture. Mild sigmoid diverticulosis, without evidence of diverticulitis. No evidence of bowel obstruction. Normal appendix (series 5/image 75). Virtual colonoscopy is not designed to detect diminutive polyps (i.e., less than or equal to 5 mm), the presence or absence of which may not affect clinical management. CT ABDOMEN AND PELVIS WITHOUT CONTRAST Lower chest: 3 mm perifissural nodule along the left fissure (series 10/image 2), benign. Hepatobiliary: Moderate to severe hepatic steatosis. Gallbladder is unremarkable. No intrahepatic or extrahepatic ductal dilatation. Pancreas: Within normal limits. Spleen: Within normal limits. Adrenals/Urinary Tract: Adrenal glands are within normal limits. Status post right nephrectomy. 2.3 cm left upper pole renal cyst. Left lower pole renal sinus cyst. 1.7 cm hyperdense left lower pole renal lesion (series 5/image 60), likely reflecting a hemorrhagic cyst, although technically indeterminate. No hydronephrosis. Bladder is decompressed. Stomach/Bowel: Stomach is within normal limits. Visualized bowel as described above. Vascular/Lymphatic: No evidence of abdominal aortic aneurysm. Atherosclerotic calcifications of the abdominal aorta and branch vessels. No suspicious abdominopelvic lymphadenopathy. Reproductive: Status post hysterectomy. No adnexal masses. Other: No abdominopelvic ascites. Musculoskeletal: Degenerative changes of the visualized thoracolumbar spine. Mild lumbar levoscoliosis. IMPRESSION: No significant  colonic polyp, mass, apple core lesion, or stricture. Mild sigmoid diverticulosis, without evidence of diverticulitis. Status post right nephrectomy. No evidence of recurrent or metastatic disease. 1.7 cm hyperdense left lower pole renal lesion, likely reflecting a hemorrhagic cyst, although technically indeterminate. Correlate with prior imaging, if available. Otherwise, consider MRI abdomen with/without contrast for further characterization, as clinically warranted. Electronically Signed   By: Julian Hy M.D.   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  She has not had any ER visits  And has not requested any early refills.  Her Refill history was confirmed via [place] Controlled Substance database by me today during her visit and there have been no prescriptions of controlled substances filled from any providers other than me. .Her pain is better controlled on the current regimen and she is staying as  Active as her pain which now involves the hip,  Will allow.  Refills given for  March, April and May  have been authorized and sent         Obesity (BMI 30.0-34.9)     I have addressed  BMI and recommended wt loss of 10% of body weigh over the next 6 months using a low glycemic index diet and regular exercise as tolerated       Positive colorectal cancer screening using Cologuard test    Diagnostic colonoscopy was aborted at the sigmoid colon due to tortuosity and she underwent  a virtual colonoscopy in January 2022 which found no polyps , only diverticulosis and a left renal cyst.  She has deferred additional imaging       Renal cyst, acquired, left    Patient was notified of the finding at the time of the virtual colonoscopy report and  deferred further workup . Will review finding with patient and encourage follow up       Screening for colon cancer    Completed January 2022 with positive cologuard leading to negative colonoscopy ( virtual)       Other Visit Diagnoses    Need for immunization against influenza    -  Primary   Relevant Orders   Flu Vaccine QUAD High Dose(Fluad) (Completed)   Vitamin D deficiency       Relevant Orders   VITAMIN D 25 Hydroxy (Vit-D Deficiency, Fractures) (Completed)   Impaired fasting glucose       Relevant Orders   Hemoglobin A1c (Completed)      I am having Conni A. Franzoni maintain her cetirizine, aspirin, triamcinolone, Fish Oil, Vitamin D3, albuterol, VITAMIN D PO, albuterol, carvedilol, amLODipine, gabapentin, methocarbamol, clopidogrel, fluticasone, rosuvastatin, pantoprazole, HYDROcodone-acetaminophen, HYDROcodone-acetaminophen, and HYDROcodone-acetaminophen.  Meds ordered this encounter  Medications  . HYDROcodone-acetaminophen (NORCO) 10-325 MG tablet    Sig: Take 1 tablet by mouth every 6 (six) hours as needed.    Dispense:  120 tablet    Refill:  0  . HYDROcodone-acetaminophen (NORCO) 10-325 MG tablet    Sig: Take 1 tablet by mouth every 6 (six) hours as needed.    Dispense:  120 tablet    Refill:  0  . HYDROcodone-acetaminophen (NORCO) 10-325 MG tablet    Sig: Take 1 tablet by mouth every 6  (six) hours as needed.    Dispense:  120 tablet    Refill:  0    Medications Discontinued During This Encounter  Medication Reason  .  HYDROcodone-acetaminophen (NORCO) 10-325 MG tablet Reorder  . HYDROcodone-acetaminophen (NORCO) 10-325 MG tablet Reorder  . HYDROcodone-acetaminophen (NORCO) 10-325 MG tablet Reorder    Follow-up: Return in about 3 months (around 04/12/2021).   [name], MD

## 2021-01-10 NOTE — Patient Instructions (Addendum)
Drink  Premier Protein shake  Or Atkins or the [employer] version instead of Ensure  Eat more green vegetables   USE THE Healthy Choice Low carb power bowls FOR DINNER  !  Not the regular ones  Weight loss occurs when you take in [place] .  SERVING SIZE MATTERS.

## 2021-01-11 LAB — VITAMIN D 25 HYDROXY (VIT D DEFICIENCY, FRACTURES): VITD: 23.76 ng/mL — ABNORMAL LOW (ref 30.00–100.00)

## 2021-01-13 DIAGNOSIS — N281 Cyst of kidney, acquired: Secondary | ICD-10-CM | POA: Insufficient documentation

## 2021-01-13 NOTE — Assessment & Plan Note (Signed)
She has had drops in GFR due to use of NSAIDs and diuretics and has been advised to remain off of both.  Continue amlodipine and carvedilol   Lab Results  Component Value Date   CREATININE 1.06 01/10/2021

## 2021-01-13 NOTE — Assessment & Plan Note (Signed)
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

## 2021-01-13 NOTE — Assessment & Plan Note (Signed)
Secondary to DDD and scoliosis managed with vicodin and methocarbamol  .  She has not had any ER visits  And has not requested any early refills.  Her Refill history was confirmed via [place] Controlled Substance database by me today during her visit and there have been no prescriptions of controlled substances filled from any providers other than me. .Her pain is better controlled on the current regimen and she is staying as  Active as her pain which now involves the hip,  Will allow.  Refills given for  March, April and May  have been authorized and sent

## 2021-01-13 NOTE — Assessment & Plan Note (Signed)
I have addressed  BMI and recommended wt loss of 10% of body weigh over the next 6 months using a low glycemic index diet and regular exercise as tolerated

## 2021-01-13 NOTE — Assessment & Plan Note (Addendum)
Patient was notified of the finding at the time of the virtual colonoscopy report and  deferred further workup . Will review finding with patient and encourage follow up

## 2021-01-13 NOTE — Assessment & Plan Note (Signed)
Completed January 2022 with positive cologuard leading to negative colonoscopy ( virtual)

## 2021-01-13 NOTE — Assessment & Plan Note (Signed)
Diagnostic colonoscopy was aborted at the sigmoid colon due to tortuosity and she underwent  a virtual colonoscopy in January 2022 which found no polyps , only diverticulosis and a left renal cyst.  She has deferred additional imaging

## 2021-01-16 ENCOUNTER — Other Ambulatory Visit: Payer: Self-pay | Admitting: Internal Medicine

## 2021-01-18 ENCOUNTER — Telehealth: Payer: Self-pay

## 2021-01-18 NOTE — Telephone Encounter (Signed)
LMTCB in regards to lab results.  

## 2021-01-22 ENCOUNTER — Other Ambulatory Visit: Payer: Self-pay | Admitting: Internal Medicine

## 2021-01-22 MED ORDER — ERGOCALCIFEROL 1.25 MG (50000 UT) PO CAPS: 50000.0000 [IU] | ORAL_CAPSULE | ORAL | 0 refills | Status: DC

## 2021-01-23 ENCOUNTER — Other Ambulatory Visit: Payer: Self-pay | Admitting: Family

## 2021-01-24 ENCOUNTER — Other Ambulatory Visit: Payer: Self-pay

## 2021-01-24 MED ORDER — PANTOPRAZOLE SODIUM 40 MG PO TBEC
40.0000 mg | DELAYED_RELEASE_TABLET | Freq: Every day | ORAL | 3 refills | Status: DC
Start: 2021-01-24 — End: 2021-10-15

## 2021-01-30 ENCOUNTER — Other Ambulatory Visit: Payer: Self-pay | Admitting: Internal Medicine

## 2021-02-01 ENCOUNTER — Other Ambulatory Visit: Payer: Self-pay | Admitting: Family

## 2021-02-01 NOTE — Telephone Encounter (Signed)
Please call pt to schedule follow-up appointment for refills.  Received fax from [facility] in [place] requesting refills for Rosuvastatin 40 mg.

## 2021-02-01 NOTE — Telephone Encounter (Signed)
Unable to LVM.

## 2021-02-05 NOTE — Telephone Encounter (Signed)
Attempted to schedule.  No ans no vm.    Patient cancelled last visit states not needed.

## 2021-02-07 ENCOUNTER — Other Ambulatory Visit: Payer: Self-pay | Admitting: Family

## 2021-02-07 MED ORDER — ROSUVASTATIN CALCIUM 40 MG PO TABS: 40.0000 mg | ORAL_TABLET | Freq: Every day | ORAL | 0 refills | Status: DC

## 2021-02-08 NOTE — Telephone Encounter (Signed)
Unable to contact x3, encounter being closed and letter mailed to patient

## 2021-02-10 ENCOUNTER — Other Ambulatory Visit: Payer: Self-pay | Admitting: Internal Medicine

## 2021-02-13 ENCOUNTER — Telehealth: Payer: Self-pay | Admitting: Internal Medicine

## 2021-02-13 NOTE — Telephone Encounter (Signed)
Left message for patient to call back and schedule Medicare Annual Wellness Visit (AWV) either virtually or in office. No detailed message left    Last AWV no information please schedule at anytime   

## 2021-02-14 ENCOUNTER — Ambulatory Visit (INDEPENDENT_AMBULATORY_CARE_PROVIDER_SITE_OTHER): Payer: Medicare HMO

## 2021-02-14 VITALS — Ht 59.0 in | Wt 156.0 lb

## 2021-02-14 DIAGNOSIS — Z0000 Encounter for general adult medical examination without abnormal findings: Secondary | ICD-10-CM

## 2021-02-14 NOTE — Patient Instructions (Addendum)
Suzanne Cole , Thank you for taking time to come for your Medicare Wellness Visit. I appreciate your ongoing commitment to your health goals. Please review the following plan we discussed and let me know if I can assist you in the future.   These are the goals we discussed: Goals      Patient Stated   .  Weight goal 125lb (pt-stated)      Use peddler daily for exercise       This is a list of the screening recommended for you and due dates:  Health Maintenance  Topic Date Due  . Pneumonia vaccines (2 of 2 - PPSV23) 05/12/2019  . Flu Shot  06/11/2021  . Mammogram  10/27/2021  . Cologuard (Stool DNA test)  01/31/2023  . Tetanus Vaccine  08/20/2023  . DEXA scan (bone density measurement)  Completed  .  Hepatitis C: One time screening is recommended by Center for Disease Control  (CDC) for  adults born from [birth year] through 1965.   Completed  . HPV Vaccine  Aged Out  . COVID-19 Vaccine  Discontinued    Immunizations Immunization History  Administered Date(s) Administered  . Fluad Quad(high Dose 65+) 01/10/2021  . Influenza Split 08/27/2011, 08/25/2012  . Influenza, High Dose Seasonal PF 10/15/2018, 09/22/2019  . Influenza,inj,Quad PF,6+ Mos 08/19/2013, 11/09/2014, 08/03/2015, 08/07/2016  . Influenza-Unspecified 08/19/2017  . Moderna Sars-Covid-2 Vaccination 06/12/2020, 07/14/2020  . Pneumococcal Conjugate-13 05/11/2018  . Tdap 08/19/2013  . Zoster 05/18/2014   Keep all routine maintenance appointments.   Follow up 04/13/21 @ 10:00  Advanced directives: not yet completed.   Conditions/risks identified: none new.  Follow up in one year for your annual wellness visit.   Preventive Care 28 Years and Older, Female Preventive care refers to lifestyle choices and visits with your health care provider that can promote health and wellness. What does preventive care include?  A yearly physical exam. This is also called an annual well check.  Dental exams once or twice a  year.  Routine eye exams. Ask your health care provider how often you should have your eyes checked.  Personal lifestyle choices, including:  Daily care of your teeth and gums.  Regular physical activity.  Eating a healthy diet.  Avoiding tobacco and drug use.  Limiting alcohol use.  Practicing safe sex.  Taking low-dose aspirin every day.  Taking vitamin and mineral supplements as recommended by your health care provider. What happens during an annual well check? The services and screenings done by your health care provider during your annual well check will depend on your age, overall health, lifestyle risk factors, and family history of disease. Counseling  Your health care provider may ask you questions about your:  Alcohol use.  Tobacco use.  Drug use.  Emotional well-being.  Home and relationship well-being.  Sexual activity.  Eating habits.  History of falls.  Memory and ability to understand (cognition).  Work and work Statistician.  Reproductive health. Screening  You may have the following tests or measurements:  Height, weight, and BMI.  Blood pressure.  Lipid and cholesterol levels. These may be checked every 5 years, or more frequently if you are over 60 years old.  Skin check.  Lung cancer screening. You may have this screening every year starting at age 90 if you have a 30-pack-year history of smoking and currently smoke or have quit within the past 15 years.  Fecal occult blood test (FOBT) of the stool. You may have this test every  year starting at age 43.  Flexible sigmoidoscopy or colonoscopy. You may have a sigmoidoscopy every 5 years or a colonoscopy every 10 years starting at age 25.  Hepatitis C blood test.  Hepatitis B blood test.  Sexually transmitted disease (STD) testing.  Diabetes screening. This is done by checking your blood sugar (glucose) after you have not eaten for a while (fasting). You may have this done every 1-3  years.  Bone density scan. This is done to screen for osteoporosis. You may have this done starting at age 54.  Mammogram. This may be done every 1-2 years. Talk to your health care provider about how often you should have regular mammograms. Talk with your health care provider about your test results, treatment options, and if necessary, the need for more tests. Vaccines  Your health care provider may recommend certain vaccines, such as:  Influenza vaccine. This is recommended every year.  Tetanus, diphtheria, and acellular pertussis (Tdap, Td) vaccine. You may need a Td booster every 10 years.  Zoster vaccine. You may need this after age 43.  Pneumococcal 13-valent conjugate (PCV13) vaccine. One dose is recommended after age 49.  Pneumococcal polysaccharide (PPSV23) vaccine. One dose is recommended after age 71. Talk to your health care provider about which screenings and vaccines you need and how often you need them. This information is not intended to replace advice given to you by your health care provider. Make sure you discuss any questions you have with your health care provider. Document Released: 11/24/2015 Document Revised: 07/17/2016 Document Reviewed: 08/29/2015 Elsevier Interactive Patient Education  2017 [employer] Prevention in the Home Falls can cause injuries. They can happen to people of all ages. There are many things you can do to make your home safe and to help prevent falls. What can I do on the outside of my home?  Regularly fix the edges of walkways and driveways and fix any cracks.  Remove anything that might make you trip as you walk through a door, such as a raised step or threshold.  Trim any bushes or trees on the path to your home.  Use bright outdoor lighting.  Clear any walking paths of anything that might make someone trip, such as rocks or tools.  Regularly check to see if handrails are loose or broken. Make sure that both sides of any  steps have handrails.  Any raised decks and porches should have guardrails on the edges.  Have any leaves, snow, or ice cleared regularly.  Use sand or salt on walking paths during winter.  Clean up any spills in your garage right away. This includes oil or grease spills. What can I do in the bathroom?  Use night lights.  Install grab bars by the toilet and in the tub and shower. Do not use towel bars as grab bars.  Use non-skid mats or decals in the tub or shower.  If you need to sit down in the shower, use a plastic, non-slip stool.  Keep the floor dry. Clean up any water that spills on the floor as soon as it happens.  Remove soap buildup in the tub or shower regularly.  Attach bath mats securely with double-sided non-slip rug tape.  Do not have throw rugs and other things on the floor that can make you trip. What can I do in the bedroom?  Use night lights.  Make sure that you have a light by your bed that is easy to reach.  Do  not use any sheets or blankets that are too big for your bed. They should not hang down onto the floor.  Have a firm chair that has side arms. You can use this for support while you get dressed.  Do not have throw rugs and other things on the floor that can make you trip. What can I do in the kitchen?  Clean up any spills right away.  Avoid walking on wet floors.  Keep items that you use a lot in easy-to-reach places.  If you need to reach something above you, use a strong step stool that has a grab bar.  Keep electrical cords out of the way.  Do not use floor polish or wax that makes floors slippery. If you must use wax, use non-skid floor wax.  Do not have throw rugs and other things on the floor that can make you trip. What can I do with my stairs?  Do not leave any items on the stairs.  Make sure that there are handrails on both sides of the stairs and use them. Fix handrails that are broken or loose. Make sure that handrails are  as long as the stairways.  Check any carpeting to make sure that it is firmly attached to the stairs. Fix any carpet that is loose or worn.  Avoid having throw rugs at the top or bottom of the stairs. If you do have throw rugs, attach them to the floor with carpet tape.  Make sure that you have a light switch at the top of the stairs and the bottom of the stairs. If you do not have them, ask someone to add them for you. What else can I do to help prevent falls?  Wear shoes that:  Do not have high heels.  Have rubber bottoms.  Are comfortable and fit you well.  Are closed at the toe. Do not wear sandals.  If you use a stepladder:  Make sure that it is fully opened. Do not climb a closed stepladder.  Make sure that both sides of the stepladder are locked into place.  Ask someone to hold it for you, if possible.  Clearly mark and make sure that you can see:  Any grab bars or handrails.  First and last steps.  Where the edge of each step is.  Use tools that help you move around (mobility aids) if they are needed. These include:  Canes.  Walkers.  Scooters.  Crutches.  Turn on the lights when you go into a dark area. Replace any light bulbs as soon as they burn out.  Set up your furniture so you have a clear path. Avoid moving your furniture around.  If any of your floors are uneven, fix them.  If there are any pets around you, be aware of where they are.  Review your medicines with your doctor. Some medicines can make you feel dizzy. This can increase your chance of falling. Ask your doctor what other things that you can do to help prevent falls. This information is not intended to replace advice given to you by your health care provider. Make sure you discuss any questions you have with your health care provider. Document Released: 08/24/2009 Document Revised: 04/04/2016 Document Reviewed: 12/02/2014 Elsevier Interactive Patient Education  2017 [employer].
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
## 2021-02-19 ENCOUNTER — Other Ambulatory Visit: Payer: Self-pay | Admitting: Internal Medicine

## 2021-03-07 ENCOUNTER — Other Ambulatory Visit: Payer: Self-pay | Admitting: Family

## 2021-04-10 ENCOUNTER — Telehealth: Payer: Self-pay | Admitting: Internal Medicine

## 2021-04-10 MED ORDER — HYDROCODONE-ACETAMINOPHEN 10-325 MG PO TABS: 1.0000 | ORAL_TABLET | Freq: Four times a day (QID) | ORAL | 0 refills | Status: DC | PRN

## 2021-04-10 NOTE — Telephone Encounter (Signed)
Vicodin courtesy refill has been done.If a patient is receiving chronic opioids,  It is the patient's responsibility to book her appt 3 months in advance

## 2021-04-10 NOTE — Telephone Encounter (Signed)
Called patient to confirm/screen appt for 05/10/21. She asked if this is in office, patient was advised she needed to come into office. After that patient stated she will need a refill on her HYDROcodone-acetaminophen (NORCO) 10-325 MG tablet on 04/12/2021. Will Dr Derrel Nip fill before her appointment?

## 2021-04-10 NOTE — Telephone Encounter (Signed)
RX Refill:norco Last Seen:01-10-21 Last ordered:03-12-21

## 2021-04-11 NOTE — Telephone Encounter (Signed)
Pt is scheduled for 04/13/2021.

## 2021-04-13 ENCOUNTER — Ambulatory Visit: Payer: Medicare HMO | Admitting: Internal Medicine

## 2021-04-13 ENCOUNTER — Other Ambulatory Visit: Payer: Self-pay

## 2021-04-13 ENCOUNTER — Encounter: Payer: Self-pay | Admitting: Internal Medicine

## 2021-04-13 VITALS — BP 156/78 | HR 85 | Temp 96.8°F | Resp 16 | Ht 59.0 in | Wt 153.4 lb

## 2021-04-13 DIAGNOSIS — I10 Essential (primary) hypertension: Secondary | ICD-10-CM | POA: Diagnosis not present

## 2021-04-13 DIAGNOSIS — R7303 Prediabetes: Secondary | ICD-10-CM | POA: Diagnosis not present

## 2021-04-13 DIAGNOSIS — I130 Hypertensive heart and chronic kidney disease with heart failure and stage 1 through stage 4 chronic kidney disease, or unspecified chronic kidney disease: Secondary | ICD-10-CM | POA: Diagnosis not present

## 2021-04-13 DIAGNOSIS — N182 Chronic kidney disease, stage 2 (mild): Secondary | ICD-10-CM | POA: Diagnosis not present

## 2021-04-13 DIAGNOSIS — E785 Hyperlipidemia, unspecified: Secondary | ICD-10-CM | POA: Diagnosis not present

## 2021-04-13 DIAGNOSIS — M5416 Radiculopathy, lumbar region: Secondary | ICD-10-CM | POA: Diagnosis not present

## 2021-04-13 DIAGNOSIS — I700 Atherosclerosis of aorta: Secondary | ICD-10-CM

## 2021-04-13 DIAGNOSIS — N183 Chronic kidney disease, stage 3 unspecified: Secondary | ICD-10-CM

## 2021-04-13 DIAGNOSIS — I5030 Unspecified diastolic (congestive) heart failure: Secondary | ICD-10-CM

## 2021-04-13 DIAGNOSIS — N281 Cyst of kidney, acquired: Secondary | ICD-10-CM

## 2021-04-13 NOTE — Progress Notes (Signed)
Subjective:  Patient ID: Suzanne Cole, female    DOB: [birth year]  Age: 70 y.o. MRN: [id number]  CC: The primary encounter diagnosis was CKD (chronic kidney disease) stage 2, GFR 60-89 ml/min. 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  HPI  Suzanne Cole presents for medication refill , follow up on hypertension,  Prediabetes and chronic back and hip pain.   This visit occurred during the SARS-CoV-2 public health emergency.  Safety protocols were in place, including screening questions prior to the visit, additional usage of staff PPE, and extensive cleaning of exam room while observing appropriate contact time as indicated for disinfecting solutions.    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  Managed with 3-4 vicodin daily.  Has had more trouble walking over the last several months due to worsening hip pain on the left . Using a rolling walker with a seat but still has significant pain even walking down her driveway or 811 feet. Using a topical lotion that provides temporary relief. Starting to use an inversion table daily at home   Again,  But it hurts her left ankle which has had surgery.    Prediabetes:  Trying to lose weight.  6  Lbs lost by her scale by cutting out snacks /candy/potato chips .  Using fruit and snack breaks (cheese/cranberries)  Difficult to exercise due to orthopedic issues, but planning to use the pool.   Renal cysts noted on last imaging study reviewed . Discussion of  creatinine as it relates to kidney function reviewed     Outpatient Medications  Prior to Visit  Medication Sig Dispense Refill  . albuterol (PROVENTIL) (2.5 MG/3ML) 0.083% nebulizer solution Take 3 mLs (2.5 mg total) by nebulization every 6 (six) hours as needed for wheezing or shortness of breath. 150 mL 1  . albuterol (VENTOLIN HFA) 108 (90 Base) MCG/ACT inhaler INHALE 2 PUFFS BY MOUTH INTO THE LUNGS EVERY 6 HOURS AS NEEDED FOR WHEEZING OR SHORTNESS OF BREATH 18 g 2  . amLODipine (NORVASC) 10 MG tablet TAKE 1 TABLET(10 MG) BY MOUTH DAILY FOR HIGH BLOOD PRESSURE 90 tablet 1  . aspirin 81 MG tablet Take 81 mg by mouth as needed for pain.    . cetirizine (ZYRTEC) 10 MG tablet Take 10 mg by mouth daily.    . Cholecalciferol (VITAMIN D3) 1000 units CHEW Chew by mouth daily.     . clopidogrel (PLAVIX) 75 MG tablet TAKE 1 TABLET BY MOUTH ONCE DAILY 90 tablet 0  . ergocalciferol (DRISDOL) 1.25 MG (50000 UT) capsule Take 1 capsule (50,000 Units total) by mouth once a week. 12 capsule 0  . fluticasone (FLONASE) 50 MCG/ACT nasal spray Place 2 sprays into both nostrils daily. 16 g 6  . gabapentin (NEURONTIN) 300 MG capsule TAKE 1 CAPSULE(300 MG) BY MOUTH THREE TIMES DAILY 270 capsule 1  . HYDROcodone-acetaminophen (NORCO) 10-325 MG tablet Take 1 tablet by mouth every 6 (six) hours as needed. 120 tablet 0  . HYDROcodone-acetaminophen (NORCO) 10-325 MG tablet Take 1 tablet by mouth every 6 (six) hours as needed. 120 tablet 0  . HYDROcodone-acetaminophen (NORCO) 10-325 MG tablet Take 1  tablet by mouth every 6 (six) hours as needed. 120 tablet 0  . methocarbamol (ROBAXIN) 750 MG tablet TAKE 1 TABLET(750 MG) BY MOUTH FOUR TIMES DAILY 360 tablet 1  . Omega-3 Fatty Acids (FISH OIL) 1000 MG CAPS Take 3 capsules (3,000 mg total) by mouth daily. 90 capsule 0  . pantoprazole (PROTONIX) 40 MG tablet Take 1 tablet (40 mg total) by mouth daily. 90 tablet 3  . rosuvastatin (CRESTOR) 40 MG tablet TAKE 1 TABLET(40 MG) BY MOUTH DAILY 30 tablet 0  . triamcinolone cream (KENALOG) 0.1 % Apply 1 application  topically 2 (two) times daily. 30 g 0  . VITAMIN D PO Take 1 capsule by mouth daily. Take 5,000 IUs daily.    . carvedilol (COREG) 25 MG tablet Take 1 tablet (25 mg total) by mouth 2 (two) times daily. 180 tablet 3   No facility-administered medications prior to visit.    Review of Systems;  Patient denies headache, fevers, malaise, unintentional weight loss, skin rash, eye pain, sinus congestion and sinus pain, sore throat, dysphagia,  hemoptysis , cough, dyspnea, wheezing, chest pain, palpitations, orthopnea, edema, abdominal pain, nausea, melena, diarrhea, constipation, flank pain, dysuria, hematuria, urinary  Frequency, nocturia, numbness, tingling, seizures,  Focal weakness, Loss of consciousness,  Tremor, insomnia, depression, anxiety, and suicidal ideation.      Objective:  BP (!) 156/78 (BP Location: Left Arm, Patient Position: Sitting, Cuff Size: Normal)   Pulse 85   Temp (!) 96.8 F (36 C) (Temporal)   Resp 16   Ht 4\' 11"  (1.499 m)   Wt 153 lb 6.4 oz (69.6 kg)   SpO2 96%   BMI 30.98 kg/m   BP Readings from Last 3 Encounters:  04/13/21 (!) 156/78  01/10/21 (!) 116/56  09/01/20 (!) 128/46    Wt Readings from Last 3 Encounters:  04/13/21 153 lb 6.4 oz (69.6 kg)  02/14/21 156 lb (70.8 kg)  01/10/21 156 lb (70.8 kg)    General appearance: alert, cooperative and appears stated age Ears: normal TM's and external ear canals both ears Throat: lips, mucosa, and tongue normal; teeth and gums normal Neck: no adenopathy, no carotid bruit, supple, symmetrical, trachea midline and thyroid not enlarged, symmetric, no tenderness/mass/nodules Back: symmetric, no curvature. ROM normal. No CVA tenderness. Lungs: clear to auscultation bilaterally Heart: regular rate and rhythm, S1, S2 normal, no murmur, click, rub or gallop Abdomen: soft, non-tender; bowel sounds normal; no masses,  no organomegaly Pulses: 2+ and symmetric Skin: Skin color, texture, turgor normal. No rashes or  lesions Lymph nodes: Cervical, supraclavicular, and axillary nodes normal.  Lab Results  Component Value Date   HGBA1C 6.1 01/10/2021   HGBA1C 5.6 03/14/2017   HGBA1C 5.7 11/09/2014    Lab Results  Component Value Date   CREATININE 1.06 01/10/2021   CREATININE 1.21 (H) 06/28/2020   CREATININE 0.90 11/03/2019    Lab Results  Component Value Date   WBC 8.6 01/29/2018   HGB 12.0 01/29/2018   HCT 35.7 (L) 01/29/2018   PLT 316.0 01/29/2018   GLUCOSE 109 (H) 01/10/2021   CHOL 160 01/10/2021   TRIG 246.0 (H) 01/10/2021   HDL 44.30 01/10/2021   LDLDIRECT 72.0 01/10/2021   LDLCALC 25 07/12/2020   ALT 11 01/10/2021   AST 16 01/10/2021   NA 133 (L) 01/10/2021   K 4.8 01/10/2021   CL 100 01/10/2021   CREATININE 1.06 01/10/2021   BUN 16 01/10/2021   CO2 25 01/10/2021   TSH 1.29 08/03/2015  INR 0.9 11/28/2014   HGBA1C 6.1 01/10/2021   MICROALBUR <0.7 12/13/2016    CT VIRTUAL COLONOSCOPY DIAGNOSTIC  Result Date: 11/17/2020 CLINICAL DATA:  Incomplete colonoscopy, positive Cologuard test. 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 EXAM: CT VIRTUAL COLONOSCOPY DIAGNOSTIC TECHNIQUE: The patient was given a standard Mag citrate bowel preparation with Gastrografin and barium for fluid and stool tagging respectively. The quality of the bowel preparation is moderate. Automated CO2 insufflation of the colon was performed prior to image acquisition and colonic distention is moderate. Image post processing was used to generate a 3D endoluminal fly-through projection of the colon and to electronically subtract stool/fluid as appropriate. COMPARISON:  None. FINDINGS: VIRTUAL COLONOSCOPY Mild layering fluid/contrast in the colon, which shifts positions on supine/prone imaging, and does not constraining evaluation. No significant colonic polyp, mass, apple core lesion, or stricture. Mild sigmoid diverticulosis, without evidence of diverticulitis. No evidence of bowel obstruction.  Normal appendix (series 5/image 75). Virtual colonoscopy is not designed to detect diminutive polyps (i.e., less than or equal to 5 mm), the presence or absence of which may not affect clinical management. CT ABDOMEN AND PELVIS WITHOUT CONTRAST Lower chest: 3 mm perifissural nodule along the left fissure (series 10/image 2), benign. Hepatobiliary: Moderate to severe hepatic steatosis. Gallbladder is unremarkable. No intrahepatic or extrahepatic ductal dilatation. Pancreas: Within normal limits. Spleen: Within normal limits. Adrenals/Urinary Tract: Adrenal glands are within normal limits. Status post right nephrectomy. 2.3 cm left upper pole renal cyst. Left lower pole renal sinus cyst. 1.7 cm hyperdense left lower pole renal lesion (series 5/image 60), likely reflecting a hemorrhagic cyst, although technically indeterminate. No hydronephrosis. Bladder is decompressed. Stomach/Bowel: Stomach is within normal limits. Visualized bowel as described above. Vascular/Lymphatic: No evidence of abdominal aortic aneurysm. Atherosclerotic calcifications of the abdominal aorta and branch vessels. No suspicious abdominopelvic lymphadenopathy. Reproductive: Status post hysterectomy. No adnexal masses. Other: No abdominopelvic ascites. Musculoskeletal: Degenerative changes of the visualized thoracolumbar spine. Mild lumbar levoscoliosis. IMPRESSION: No significant colonic polyp, mass, apple core lesion, or stricture. Mild sigmoid diverticulosis, without evidence of diverticulitis. Status post right nephrectomy. No evidence of recurrent or metastatic disease. 1.7 cm hyperdense left lower pole renal lesion, likely reflecting a hemorrhagic cyst, although technically indeterminate. Correlate with prior imaging, if available. Otherwise, consider MRI abdomen with/without contrast for further characterization, as clinically warranted. Electronically Signed   By: Julian Hy M.D.   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  Continue amlodipine and carvedilol   Lab Results  Component Value Date   CREATININE 1.06 01/10/2021         CKD (chronic kidney disease), symptom management only, stage 3 (moderate) ([facility])    New diagnosis  Explained to patient the natural history of CKD,  Need for BP to be < 130/80      Hyperlipidemia LDL goal <70    She is at goal LDL of < 70 on crestor high dose for known CAD and PAD.LFTs are normal.   Lab Results  Component Value Date   CHOL 160 01/10/2021   HDL 44.30 01/10/2021   LDLCALC 25 07/12/2020   LDLDIRECT 72.0 01/10/2021  TRIG 246.0 (H) 01/10/2021   CHOLHDL 4 01/10/2021   Lab Results  Component Value Date   ALT 11 01/10/2021   AST 16 01/10/2021   ALKPHOS 61 01/10/2021   BILITOT 0.7 01/10/2021         Hypertension    She has had drops in GFR due to use of NSAIDs and diuretics and has been advised to remain off of both.  Continue amlodipine and carvedilol   Lab Results  Component Value Date   CREATININE 1.06 01/10/2021         Lumbar radiculitis    Secondary to DDD and scoliosis managed with vicodin and methocarbamol  .  She has not had any ER visits  And has not requested any early refills.  Her Refill history was confirmed via [place] Controlled Substance database by me today during her visit and there have been no prescriptions of controlled substances filled from any providers other than me. .Her pain is better controlled on the current regimen and she is staying as  Active as her pain which now involves the hip,  Will allow.  Refills given for June, July and August  have been authorized and sent         Renal cyst, acquired, left    Patient was notified of the finding at the time of  the virtual colonoscopy report and  deferred further workup.  I have reviewed the finding with patient and she continues to defer referral because she states that she has seen them before       Thoracic aortic atherosclerosis [facility])    Reviewed findings of prior CT scan today..  Patient is tolerating high potency statin therapy        Other Visit Diagnoses    CKD (chronic kidney disease) stage 2, GFR 60-89 ml/min    -  Primary   Relevant Orders   Microalbumin / creatinine urine ratio   Comprehensive metabolic panel   Prediabetes       Relevant Orders   Comprehensive metabolic panel   Hemoglobin A1c   Lipid panel      I am having Suzanne Cole maintain her cetirizine, aspirin, triamcinolone cream, Fish Oil, Vitamin D3, albuterol, VITAMIN D PO, carvedilol, clopidogrel, fluticasone, HYDROcodone-acetaminophen, HYDROcodone-acetaminophen, gabapentin, ergocalciferol, pantoprazole, amLODipine, albuterol, methocarbamol, rosuvastatin, and HYDROcodone-acetaminophen.  No orders of the defined types were placed in this encounter.   There are no discontinued medications.  Follow-up: Return in about 3 months (around 07/14/2021).   [name], MD

## 2021-04-13 NOTE — Patient Instructions (Addendum)
You have mild CHRONIC KIDNEY DISEASE.  Our goal for optimal blood pressure management to prevent more decline in kidney function is  120/70.  Please check your blood pressure  5 DAYS IN A ROW  at home and send me the readings so I can determine if you need a change in medication    Chronic Kidney Disease, Adult Chronic kidney disease (CKD) occurs when the kidneys are slowly and permanently damaged over a long period of time. The kidneys are a pair of organs that do many important jobs in the body, including:  Removing waste and extra fluid from the blood to make urine.  Making hormones that maintain the amount of fluid in tissues and blood vessels.  Maintaining the right amount of fluids and chemicals in the body. A small amount of kidney damage may not cause problems, but a large amount of damage may make it hard or impossible for the kidneys to work right. Steps must be taken to slow kidney damage or to stop it from getting worse. If steps are not taken, the kidneys may stop working permanently (end-stage renal disease, or ESRD). Most of the time, CKD does not go away, but it can often be controlled. People who have CKD are usually able to live full lives. What are the causes? The most common causes of this condition are diabetes and high blood pressure (hypertension). Other causes include:  Cardiovascular diseases. These affect the heart and blood vessels.  Kidney diseases. These include: ? Glomerulonephritis, or inflammation of the tiny filters in the kidneys. ? Interstitial nephritis. This is swelling of the small tubes of the kidneys and of the surrounding structures. ? Polycystic kidney disease, in which clusters of fluid-filled sacs form within the kidneys. ? Renal vascular disease. This includes disorders that affect the arteries and veins of the kidneys.  Diseases that affect the body's defense system (immune system).  A problem with urine flow. This may be caused by: ? Kidney  stones. ? Cancer. ? An enlarged prostate, in males.  A kidney infection or urinary tract infection (UTI) that keeps coming back.  Vasculitis. This is swelling or inflammation of the blood vessels. What increases the risk? Your chances of having kidney disease increase with age. The following factors may make you more likely to develop this condition:  A family history of kidney disease or kidney failure. Kidney failure means the kidneys can no longer work right.  Certain genetic diseases.  Taking medicines often that are damaging to the kidneys.  Being around or being in contact with toxic substances.  Obesity.  A history of tobacco use. What are the signs or symptoms? Symptoms of this condition include:  Feeling very tired (lethargic) and having less energy.  Swelling, or edema, of the face, legs, ankles, or feet.  Nausea or vomiting, or loss of appetite.  Confusion or trouble concentrating.  Muscle twitches and cramps, especially in the legs.  Dry, itchy skin.  A metallic taste in the mouth.  Producing less urine, or producing more urine (especially at night).  Shortness of breath.  Trouble sleeping. CKD may also result in not having enough red blood cells or hemoglobin in the blood (anemia) or having weak bones (bone disease). Symptoms develop slowly and may not be obvious until the kidney damage becomes severe. It is possible to have kidney disease for years without having symptoms. How is this diagnosed? This condition may be diagnosed based on:  Blood tests.  Urine tests.  Imaging tests,  such as an ultrasound or a CT scan.  A kidney biopsy. This involves removing a sample of kidney tissue to be looked at under a microscope. Results from these tests will help to determine how serious the CKD is. How is this treated? There is no cure for most cases of this condition, but treatment usually relieves symptoms and prevents or slows the worsening of the  disease. Treatment may include:  Diet changes, which may require you to avoid alcohol and foods that are high in salt, potassium, phosphorous, and protein.  Medicines. These may: ? Lower blood pressure. ? Control blood sugar (glucose). ? Relieve anemia. ? Relieve swelling. ? Protect your bones. ? Improve the balance of salts and minerals in your blood (electrolytes).  Dialysis, which is a type of treatment that removes toxic waste from the body. It may be needed if you have kidney failure.  Managing any other conditions that are causing your CKD or making it worse. Follow these instructions at home: Medicines  Take over-the-counter and prescription medicines only as told by your health care provider. The amount of some medicines that you take may need to be changed.  Do not take any new medicines unless approved by your health care provider. Many medicines can make kidney damage worse.  Do not take any vitamin and mineral supplements unless approved by your health care provider. Many nutritional supplements can make kidney damage worse. Lifestyle  Do not use any products that contain nicotine or tobacco, such as cigarettes, e-cigarettes, and chewing tobacco. If you need help quitting, ask your health care provider.  If you drink alcohol: ? Limit how much you use to:  0-1 drink a day for women who are not pregnant.  0-2 drinks a day for men. ? Know how much alcohol is in your drink. In the [place] one drink equals one 12 oz bottle of beer (355 mL), one 5 oz glass of wine (148 mL), or one 1 oz glass of hard liquor (44 mL).  Maintain a healthy weight. If you need help, ask your health care provider.   General instructions  Follow instructions from your health care provider about eating or drinking restrictions, including any prescribed diet.  Track your blood pressure at home. Report changes in your blood pressure as told.  If you are being treated for diabetes, track your blood  glucose levels as told.  Start or continue an exercise plan. Exercise at least 30 minutes a day, 5 days a week.  Keep your immunizations up to date as told.  Keep all follow-up visits. This is important.   Where to find more information  American Association of Kidney Patients: BombTimer.gl  National Kidney Foundation: www.kidney.[place]: https://mathis.com/  Life Options: www.lifeoptions.org  Kidney School: www.kidneyschool.org Contact a health care provider if:  Your symptoms get worse.  You develop new symptoms. Get help right away if:  You develop symptoms of ESRD. These include: ? Headaches. ? Numbness in your hands or feet. ? Easy bruising. ? Frequent hiccups. ? Chest pain. ? Shortness of breath. ? Lack of menstrual periods, in women.  You have a fever.  You are producing less urine than usual.  You have pain or bleeding when you urinate or when you have a bowel movement. These symptoms may represent a serious problem that is an emergency. Do not wait to see if the symptoms will go away. Get medical help right away. Call your local emergency services (911 in the [place]  Do not drive yourself to the hospital. Summary  Chronic kidney disease (CKD) occurs when the kidneys become damaged slowly over a long period of time.  The most common causes of this condition are diabetes and high blood pressure (hypertension).  There is no cure for most cases of CKD, but treatment usually relieves symptoms and prevents or slows the worsening of the disease. Treatment may include a combination of lifestyle changes, medicines, and dialysis. This information is not intended to replace advice given to you by your health care provider. Make sure you discuss any questions you have with your health care provider. Document Revised: 02/02/2020 Document Reviewed: 02/02/2020 Elsevier Patient Education  [place].

## 2021-04-15 DIAGNOSIS — N183 Chronic kidney disease, stage 3 unspecified: Secondary | ICD-10-CM | POA: Insufficient documentation

## 2021-04-15 NOTE — Assessment & Plan Note (Signed)
She has had drops in GFR due to use of NSAIDs and diuretics and has been advised to remain off of both.  Continue amlodipine and carvedilol   Lab Results  Component Value Date   CREATININE 1.06 01/10/2021

## 2021-04-15 NOTE — Assessment & Plan Note (Addendum)
Secondary to DDD and scoliosis managed with vicodin and methocarbamol  .  She has not had any ER visits  And has not requested any early refills.  Her Refill history was confirmed via [place] Controlled Substance database by me today during her visit and there have been no prescriptions of controlled substances filled from any providers other than me. .Her pain is better controlled on the current regimen and she is staying as  Active as her pain which now involves the hip,  Will allow.  Refills given for June, July and August  have been authorized and sent

## 2021-04-15 NOTE — Assessment & Plan Note (Signed)
New diagnosis  Explained to patient the natural history of CKD,  Need for BP to be < 130/80

## 2021-04-15 NOTE — Assessment & Plan Note (Signed)
Patient was notified of the finding at the time of the virtual colonoscopy report and  deferred further workup.  I have reviewed the finding with patient and she continues to defer referral because she states that she has seen them before

## 2021-04-15 NOTE — Assessment & Plan Note (Signed)
Reviewed findings of prior CT scan today..  Patient is tolerating high potency statin therapy  

## 2021-04-15 NOTE — Assessment & Plan Note (Signed)
She is at goal LDL of < 70 on crestor high dose for known CAD and PAD.LFTs are normal.   Lab Results  Component Value Date   CHOL 160 01/10/2021   HDL 44.30 01/10/2021   LDLCALC 25 07/12/2020   LDLDIRECT 72.0 01/10/2021   TRIG 246.0 (H) 01/10/2021   CHOLHDL 4 01/10/2021   Lab Results  Component Value Date   ALT 11 01/10/2021   AST 16 01/10/2021   ALKPHOS 61 01/10/2021   BILITOT 0.7 01/10/2021

## 2021-05-11 ENCOUNTER — Other Ambulatory Visit: Payer: Self-pay

## 2021-05-11 MED ORDER — HYDROCODONE-ACETAMINOPHEN 10-325 MG PO TABS: 1.0000 | ORAL_TABLET | Freq: Four times a day (QID) | ORAL | 0 refills | Status: DC | PRN

## 2021-05-11 NOTE — Telephone Encounter (Signed)
Refilled: 04/11/2021 Last OV: 04/13/2021 Next OV: 07/13/2021

## 2021-05-11 NOTE — Telephone Encounter (Signed)
RX Refill:norco Last Seen:04-13-21 Last ordered:04-11-21

## 2021-05-24 DIAGNOSIS — J0100 Acute maxillary sinusitis, unspecified: Secondary | ICD-10-CM | POA: Diagnosis not present

## 2021-07-05 ENCOUNTER — Telehealth: Payer: Self-pay

## 2021-07-05 MED ORDER — HYDROCODONE-ACETAMINOPHEN 10-325 MG PO TABS: 1.0000 | ORAL_TABLET | Freq: Four times a day (QID) | ORAL | 0 refills | Status: DC | PRN

## 2021-07-05 NOTE — Telephone Encounter (Signed)
Refilled: 06/11/2021 Last OV: 04/13/2021 Next OV: 07/13/2021

## 2021-07-12 MED ORDER — HYDROCODONE-ACETAMINOPHEN 10-325 MG PO TABS: 1.0000 | ORAL_TABLET | Freq: Four times a day (QID) | ORAL | 0 refills | Status: DC | PRN

## 2021-07-12 NOTE — Telephone Encounter (Signed)
Patient is calling to check on the refill for her HYDROcodone-acetaminophen (NORCO) 10-325 MG tablet.She stated that her pharmacy said the prescription did not go through and she would like for it to be sent to the [facility] in [place].

## 2021-07-12 NOTE — Telephone Encounter (Signed)
Patient's HYDROcodone-acetaminophen (NORCO) 10-325 MG tablet was sent yesterday to pharmacy. The pharmacy does not have the prescription.

## 2021-07-12 NOTE — Telephone Encounter (Signed)
Refill has been sent to [facility] 

## 2021-07-12 NOTE — Telephone Encounter (Signed)
The rx failed it never went through.

## 2021-07-12 NOTE — Addendum Note (Signed)
Addended by: [name] on: 07/12/2021 02:54 PM   Modules accepted: Orders

## 2021-07-13 ENCOUNTER — Other Ambulatory Visit: Payer: Self-pay

## 2021-07-13 ENCOUNTER — Ambulatory Visit (INDEPENDENT_AMBULATORY_CARE_PROVIDER_SITE_OTHER): Payer: Medicare HMO | Admitting: Internal Medicine

## 2021-07-13 VITALS — BP 140/80 | HR 74 | Temp 97.9°F | Resp 16 | Ht 59.0 in | Wt 151.4 lb

## 2021-07-13 DIAGNOSIS — R7303 Prediabetes: Secondary | ICD-10-CM | POA: Diagnosis not present

## 2021-07-13 DIAGNOSIS — N281 Cyst of kidney, acquired: Secondary | ICD-10-CM

## 2021-07-13 DIAGNOSIS — I130 Hypertensive heart and chronic kidney disease with heart failure and stage 1 through stage 4 chronic kidney disease, or unspecified chronic kidney disease: Secondary | ICD-10-CM | POA: Diagnosis not present

## 2021-07-13 DIAGNOSIS — E875 Hyperkalemia: Secondary | ICD-10-CM | POA: Diagnosis not present

## 2021-07-13 DIAGNOSIS — I5030 Unspecified diastolic (congestive) heart failure: Secondary | ICD-10-CM | POA: Diagnosis not present

## 2021-07-13 DIAGNOSIS — M5416 Radiculopathy, lumbar region: Secondary | ICD-10-CM | POA: Diagnosis not present

## 2021-07-13 DIAGNOSIS — N182 Chronic kidney disease, stage 2 (mild): Secondary | ICD-10-CM | POA: Diagnosis not present

## 2021-07-13 DIAGNOSIS — N183 Chronic kidney disease, stage 3 unspecified: Secondary | ICD-10-CM

## 2021-07-13 LAB — COMPREHENSIVE METABOLIC PANEL
ALT: 8 U/L (ref 0–35)
AST: 15 U/L (ref 0–37)
Albumin: 4.2 g/dL (ref 3.5–5.2)
Alkaline Phosphatase: 64 U/L (ref 39–117)
BUN: 15 mg/dL (ref 6–23)
CO2: 25 mEq/L (ref 19–32)
Calcium: 9.3 mg/dL (ref 8.4–10.5)
Chloride: 104 mEq/L (ref 96–112)
Creatinine, Ser: 1.03 mg/dL (ref 0.40–1.20)
GFR: 55.33 mL/min — ABNORMAL LOW (ref >60.00)
Glucose, Bld: 110 mg/dL — ABNORMAL HIGH (ref 70–99)
Potassium: 5.3 mEq/L — ABNORMAL HIGH (ref 3.5–5.1)
Sodium: 137 mEq/L (ref 135–145)
Total Bilirubin: 0.7 mg/dL (ref 0.2–1.2)
Total Protein: 7.6 g/dL (ref 6.0–8.3)

## 2021-07-13 LAB — LIPID PANEL
Cholesterol: 130 mg/dL (ref 0–200)
HDL: 47.6 mg/dL (ref >39.00)
NonHDL: 82.87
Total CHOL/HDL Ratio: 3
Triglycerides: 223 mg/dL — ABNORMAL HIGH (ref 0.0–149.0)
VLDL: 44.6 mg/dL — ABNORMAL HIGH (ref 0.0–40.0)

## 2021-07-13 LAB — MICROALBUMIN / CREATININE URINE RATIO
Creatinine,U: 71 mg/dL
Microalb Creat Ratio: 1.4 mg/g (ref 0.0–30.0)
Microalb, Ur: 1 mg/dL (ref 0.0–1.9)

## 2021-07-13 LAB — HEMOGLOBIN A1C: Hgb A1c MFr Bld: 6.2 % (ref 4.6–6.5)

## 2021-07-13 LAB — LDL CHOLESTEROL, DIRECT: Direct LDL: 48 mg/dL

## 2021-07-13 NOTE — Progress Notes (Signed)
Subjective:  Patient ID: Suzanne Cole, female    DOB: 05-May-1951  Age: 70 y.o. MRN: [id number]  CC: The primary encounter diagnosis was Hyperkalemia. 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  HPI Suzanne Cole presents for medication refill and follow up on hyperlipidemia, CKD and prediabetes  Chief Complaint  Patient presents with   Medication Refill    Follow up for hydrocodone refill    [place] DEFERRING  INCIDENTAL FINDING OF CYST ON KIDNEY DISCUSSED  AND diagnosed of CKD discussed.  She is not  using nsaids . She is Using 4 vicodin daily no additional tylenol   Had COVID INFECTION,   [place] TEST IN EARLY AUGUST 2 after being treated by urgent care for FOR SINUSITIS WITH DOXYCYCLINE.  DEVELOPED ALTERED SENSE OF SMELL which has resolved ; husband  was positive. Symptoms have resolved.  Sense of smell resolved.   HTN:  Patient is taking her medications as prescribed and notes no adverse effects.  Home BP readings have been done about once per week and are  generally < 130/70 .  She is avoiding added salt in her diet and using the CUBIE  for  exercise. Cannot walk very well due to back pain .  Today's reading is elevated due to stressful   Losing weight by home scales   Reqesting DME for motorized  scooter at home.  She Using a walker and cane when she goes out of  the home.    Outpatient Medications Prior to Visit  Medication Sig Dispense Refill   albuterol (PROVENTIL) (2.5 MG/3ML) 0.083% nebulizer solution Take 3 mLs (2.5 mg total) by nebulization every 6 (six) hours as needed for wheezing or shortness of breath. 150 mL 1    albuterol (VENTOLIN HFA) 108 (90 Base) MCG/ACT inhaler INHALE 2 PUFFS BY MOUTH INTO THE LUNGS EVERY 6 HOURS AS NEEDED FOR WHEEZING OR SHORTNESS OF BREATH 18 g 2   amLODipine (NORVASC) 10 MG tablet TAKE 1 TABLET(10 MG) BY MOUTH DAILY FOR HIGH BLOOD PRESSURE 90 tablet 1   aspirin 81 MG tablet Take 81 mg by mouth as needed for pain.     carvedilol (COREG) 25 MG tablet Take 1 tablet (25 mg total) by mouth 2 (two) times daily. 180 tablet 3   cetirizine (ZYRTEC) 10 MG tablet Take 10 mg by mouth daily.     Cholecalciferol (VITAMIN D3) 1000 units CHEW Chew by mouth daily.      clopidogrel (PLAVIX) 75 MG tablet TAKE 1 TABLET BY MOUTH ONCE DAILY 90 tablet 0   fluticasone (FLONASE) 50 MCG/ACT nasal spray Place 2 sprays into both nostrils daily. 16 g 6   gabapentin (NEURONTIN) 300 MG capsule TAKE 1 CAPSULE(300 MG) BY MOUTH THREE TIMES DAILY 270 capsule 1   HYDROcodone-acetaminophen (NORCO) 10-325 MG tablet Take 1 tablet by mouth every 6 (six) hours as needed. 120 tablet 0   HYDROcodone-acetaminophen (NORCO) 10-325 MG tablet Take 1 tablet by mouth every 6 (six) hours as needed. 120 tablet 0   methocarbamol (ROBAXIN) 750 MG tablet TAKE 1 TABLET(750 MG) BY MOUTH FOUR TIMES DAILY 360 tablet 1   Omega-3 Fatty Acids (FISH OIL) 1000 MG  CAPS Take 3 capsules (3,000 mg total) by mouth daily. 90 capsule 0   pantoprazole (PROTONIX) 40 MG tablet Take 1 tablet (40 mg total) by mouth daily. 90 tablet 3   rosuvastatin (CRESTOR) 40 MG tablet TAKE 1 TABLET(40 MG) BY MOUTH DAILY 30 tablet 0   triamcinolone cream (KENALOG) 0.1 % Apply 1 application topically 2 (two) times daily. 30 g 0   ergocalciferol (DRISDOL) 1.25 MG (50000 UT) capsule Take 1 capsule (50,000 Units total) by mouth once a week. 12 capsule 0   VITAMIN D PO Take 1 capsule by mouth daily. Take 5,000 IUs daily.     No facility-administered medications prior to visit.    Review of Systems;  Patient denies headache, fevers, malaise, unintentional weight loss,  skin rash, eye pain, sinus congestion and sinus pain, sore throat, dysphagia,  hemoptysis , cough, dyspnea, wheezing, chest pain, palpitations, orthopnea, edema, abdominal pain, nausea, melena, diarrhea, constipation, flank pain, dysuria, hematuria, urinary  Frequency, nocturia, numbness, tingling, seizures,  Focal weakness, Loss of consciousness,  Tremor, insomnia, depression, anxiety, and suicidal ideation.      Objective:  BP 140/80   Pulse 74   Temp 97.9 F (36.6 C)   Resp 16   Ht 4\' 11"  (1.499 m)   Wt 151 lb 6.4 oz (68.7 kg)   SpO2 98%   BMI 30.58 kg/m   BP Readings from Last 3 Encounters:  07/13/21 140/80  04/13/21 (!) 156/78  01/10/21 (!) 116/56    Wt Readings from Last 3 Encounters:  07/13/21 151 lb 6.4 oz (68.7 kg)  04/13/21 153 lb 6.4 oz (69.6 kg)  02/14/21 156 lb (70.8 kg)    General appearance: alert, cooperative and appears stated age Ears: normal TM's and external ear canals both ears Throat: lips, mucosa, and tongue normal; teeth and gums normal Neck: no adenopathy, no carotid bruit, supple, symmetrical, trachea midline and thyroid not enlarged, symmetric, no tenderness/mass/nodules Back: symmetric, no curvature. ROM normal. No CVA tenderness. Lungs: clear to auscultation bilaterally Heart: regular rate and rhythm, S1, S2 normal, no murmur, click, rub or gallop Abdomen: soft, non-tender; bowel sounds normal; no masses,  no organomegaly Pulses: 2+ and symmetric Skin: Skin color, texture, turgor normal. No rashes or lesions Lymph nodes: Cervical, supraclavicular, and axillary nodes normal.  Lab Results  Component Value Date   HGBA1C 6.2 07/13/2021   HGBA1C 6.1 01/10/2021   HGBA1C 5.6 03/14/2017    Lab Results  Component Value Date   CREATININE 1.03 07/13/2021   CREATININE 1.06 01/10/2021   CREATININE 1.21 (H) 06/28/2020    Lab Results  Component Value Date   WBC 8.6 01/29/2018   HGB 12.0 01/29/2018   HCT 35.7 (L) 01/29/2018   PLT 316.0  01/29/2018   GLUCOSE 110 (H) 07/13/2021   CHOL 130 07/13/2021   TRIG 223.0 (H) 07/13/2021   HDL 47.60 07/13/2021   LDLDIRECT 48.0 07/13/2021   LDLCALC 25 07/12/2020   ALT 8 07/13/2021   AST 15 07/13/2021   NA 137 07/13/2021   K 5.3 No hemolysis seen (H) 07/13/2021   CL 104 07/13/2021   CREATININE 1.03 07/13/2021   BUN 15 07/13/2021   CO2 25 07/13/2021   TSH 1.29 08/03/2015   INR 0.9 11/28/2014   HGBA1C 6.2 07/13/2021   MICROALBUR 1.0 07/13/2021    CT VIRTUAL COLONOSCOPY DIAGNOSTIC  Result Date: 11/17/2020 CLINICAL DATA:  Incomplete colonoscopy, positive Cologuard test. 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 EXAM: CT VIRTUAL COLONOSCOPY DIAGNOSTIC TECHNIQUE:  The patient was given a standard Mag citrate bowel preparation with Gastrografin and barium for fluid and stool tagging respectively. The quality of the bowel preparation is moderate. Automated CO2 insufflation of the colon was performed prior to image acquisition and colonic distention is moderate. Image post processing was used to generate a 3D endoluminal fly-through projection of the colon and to electronically subtract stool/fluid as appropriate. COMPARISON:  None. FINDINGS: VIRTUAL COLONOSCOPY Mild layering fluid/contrast in the colon, which shifts positions on supine/prone imaging, and does not constraining evaluation. No significant colonic polyp, mass, apple core lesion, or stricture. Mild sigmoid diverticulosis, without evidence of diverticulitis. No evidence of bowel obstruction. Normal appendix (series 5/image 75). Virtual colonoscopy is not designed to detect diminutive polyps (i.e., less than or equal to 5 mm), the presence or absence of which may not affect clinical management. CT ABDOMEN AND PELVIS WITHOUT CONTRAST Lower chest: 3 mm perifissural nodule along the left fissure (series 10/image 2), benign. Hepatobiliary: Moderate to severe hepatic steatosis. Gallbladder is unremarkable. No intrahepatic or  extrahepatic ductal dilatation. Pancreas: Within normal limits. Spleen: Within normal limits. Adrenals/Urinary Tract: Adrenal glands are within normal limits. Status post right nephrectomy. 2.3 cm left upper pole renal cyst. Left lower pole renal sinus cyst. 1.7 cm hyperdense left lower pole renal lesion (series 5/image 60), likely reflecting a hemorrhagic cyst, although technically indeterminate. No hydronephrosis. Bladder is decompressed. Stomach/Bowel: Stomach is within normal limits. Visualized bowel as described above. Vascular/Lymphatic: No evidence of abdominal aortic aneurysm. Atherosclerotic calcifications of the abdominal aorta and branch vessels. No suspicious abdominopelvic lymphadenopathy. Reproductive: Status post hysterectomy. No adnexal masses. Other: No abdominopelvic ascites. Musculoskeletal: Degenerative changes of the visualized thoracolumbar spine. Mild lumbar levoscoliosis. IMPRESSION: No significant colonic polyp, mass, apple core lesion, or stricture. Mild sigmoid diverticulosis, without evidence of diverticulitis. Status post right nephrectomy. No evidence of recurrent or metastatic disease. 1.7 cm hyperdense left lower pole renal lesion, likely reflecting a hemorrhagic cyst, although technically indeterminate. Correlate with prior imaging, if available. Otherwise, consider MRI abdomen with/without contrast for further characterization, as clinically warranted. Electronically Signed   By: Julian Hy M.D.   On: 11/17/2020 11:35    Assessment & Plan:   Problem List Items Addressed This Visit       Unprioritized   Lumbar radiculitis    Secondary to DDD and scoliosis managed with vicodin and methocarbamol  .  She has not had any ER visits  And has not requested any early refills.  Her Refill history was confirmed via [place] Controlled Substance database by me today during her visit and there have been no prescriptions of controlled substances filled from any providers other than me.  .Her pain is better controlled on the current regimen and she is staying as  Active as her pain which now involves the hip,  Will allow. 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 Renal function stable, no changes today. Potassium will need rechecking   Lab Results  Component Value Date   CREATININE 1.03 07/13/2021   Lab Results  Component Value Date   NA 137 07/13/2021   K 5.3  No hemolysis seen (H) 07/13/2021   CL 104 07/13/2021   CO2 25 07/13/2021         Renal cyst, acquired, left    Patient was notified of the finding at the time of the virtual colonoscopy report and  deferred further workup.  I have reviewed the finding with patient and she continues to defer referral because she states that she has seen them before       CKD (chronic kidney disease), symptom management only, stage 3 (moderate) ([place])    New diagnosis  Explained to patient the natural history of CKD,  Need for BP to be < 130/80.  She is avoiding NSAIDS.      Other Visit Diagnoses     Prediabetes       CKD (chronic kidney disease) stage 2, GFR 60-89 ml/min          I provided  30 minutes of  face-to-face time during this encounter reviewing patient's current problems and past surgeries, labs and imaging studies, providing counseling on the above mentioned problems , and coordination  of care .    Medications Discontinued During This Encounter  Medication Reason   ergocalciferol (DRISDOL) 1.25 MG (50000 UT) capsule    VITAMIN D PO     Follow-up: No follow-ups on file.   [name], MD

## 2021-07-13 NOTE — Patient Instructions (Signed)
Refills for October November and December will be on file at your pharmacy  Avoid Aleve and Advil .  They can harm your kidneys

## 2021-07-14 ENCOUNTER — Other Ambulatory Visit: Payer: Self-pay | Admitting: Physician Assistant

## 2021-07-14 DIAGNOSIS — I25810 Atherosclerosis of coronary artery bypass graft(s) without angina pectoris: Secondary | ICD-10-CM

## 2021-07-15 ENCOUNTER — Encounter: Payer: Self-pay | Admitting: Internal Medicine

## 2021-07-15 NOTE — Assessment & Plan Note (Signed)
Well controlled on current regimen. Renal function stable, no changes today. Potassium will need rechecking   Lab Results  Component Value Date   CREATININE 1.03 07/13/2021   Lab Results  Component Value Date   NA 137 07/13/2021   K 5.3 No hemolysis seen (H) 07/13/2021   CL 104 07/13/2021   CO2 25 07/13/2021

## 2021-07-15 NOTE — Assessment & Plan Note (Addendum)
Secondary to DDD and scoliosis managed with vicodin and methocarbamol  .  She has not had any ER visits  And has not requested any early refills.  Her Refill history was confirmed via [place] Controlled Substance database by me today during her visit and there have been no prescriptions of controlled substances filled from any providers other than me. .Her pain is better controlled on the current regimen and she is staying as  Active as her pain which now involves the hip,  Will allow. However she does not meet criteria for a motorized scooter as requested Refills given for Sept Oct  ovember and December   have been authorized and sent

## 2021-07-15 NOTE — Assessment & Plan Note (Signed)
New diagnosis  Explained to patient the natural history of CKD,  Need for BP to be < 130/80.  She is avoiding NSAIDS.

## 2021-07-15 NOTE — Assessment & Plan Note (Signed)
Patient was notified of the finding at the time of the virtual colonoscopy report and  deferred further workup.  I have reviewed the finding with patient and she continues to defer referral because she states that she has seen them before

## 2021-07-17 ENCOUNTER — Telehealth: Payer: Self-pay

## 2021-07-17 NOTE — Telephone Encounter (Signed)
LMTCB need to schedule pt for a repeat lab appt this week.

## 2021-07-17 NOTE — Telephone Encounter (Signed)
Patient is returning your call from earlier. 

## 2021-07-17 NOTE — Telephone Encounter (Signed)
Please contact pt for future appointment. Pt due for 12 month f/u in order to continue refills. Pt needing refills.

## 2021-07-17 NOTE — Telephone Encounter (Signed)
LVM to schedule appt

## 2021-07-17 NOTE — Telephone Encounter (Signed)
See result note message 

## 2021-07-19 ENCOUNTER — Other Ambulatory Visit (INDEPENDENT_AMBULATORY_CARE_PROVIDER_SITE_OTHER): Payer: Medicare HMO

## 2021-07-19 ENCOUNTER — Other Ambulatory Visit: Payer: Self-pay

## 2021-07-19 DIAGNOSIS — E875 Hyperkalemia: Secondary | ICD-10-CM

## 2021-07-19 LAB — BASIC METABOLIC PANEL
BUN: 13 mg/dL (ref 6–23)
CO2: 26 mEq/L (ref 19–32)
Calcium: 8.9 mg/dL (ref 8.4–10.5)
Chloride: 106 mEq/L (ref 96–112)
Creatinine, Ser: 1 mg/dL (ref 0.40–1.20)
GFR: 57.32 mL/min — ABNORMAL LOW (ref >60.00)
Glucose, Bld: 116 mg/dL — ABNORMAL HIGH (ref 70–99)
Potassium: 4.6 mEq/L (ref 3.5–5.1)
Sodium: 140 mEq/L (ref 135–145)

## 2021-08-01 ENCOUNTER — Telehealth: Payer: Self-pay | Admitting: Internal Medicine

## 2021-08-01 NOTE — Telephone Encounter (Signed)
Placed in in quick sign folder.

## 2021-08-01 NOTE — Telephone Encounter (Signed)
Patient dropped off forms for handicapped placard and plates form to be filled out by the physician.

## 2021-08-02 NOTE — Telephone Encounter (Signed)
Patient calling back in to check on the status of this paperwork. States she needs to turn this in before the end of the month.   Please advise

## 2021-08-02 NOTE — Telephone Encounter (Signed)
Patient returned Suzanne Cole's call . She was informed her forms are ready. She stated she will be in the office on 9.23.22.

## 2021-08-02 NOTE — Telephone Encounter (Signed)
LMTCB. Need to let pt know that the forms have been completed and placed up front for pick up.

## 2021-08-13 ENCOUNTER — Other Ambulatory Visit: Payer: Self-pay | Admitting: Internal Medicine

## 2021-08-13 ENCOUNTER — Other Ambulatory Visit: Payer: Self-pay

## 2021-08-13 MED ORDER — HYDROCODONE-ACETAMINOPHEN 10-325 MG PO TABS: 1.0000 | ORAL_TABLET | Freq: Four times a day (QID) | ORAL | 0 refills | Status: DC | PRN

## 2021-08-13 NOTE — Telephone Encounter (Signed)
Refilled: 07/12/2021 Last OV: 07/13/2021 Next OV: 10/12/2021

## 2021-08-13 NOTE — Progress Notes (Signed)
October and November refills sent to [facility].

## 2021-08-19 ENCOUNTER — Other Ambulatory Visit: Payer: Self-pay | Admitting: Cardiovascular Disease

## 2021-08-20 ENCOUNTER — Other Ambulatory Visit: Payer: Self-pay | Admitting: Internal Medicine

## 2021-08-20 NOTE — Telephone Encounter (Signed)
Please schedule overdue F/U appointment for refills. Thank you! 

## 2021-08-20 NOTE — Telephone Encounter (Signed)
Attempted to schedule no ans no vm  

## 2021-08-21 ENCOUNTER — Other Ambulatory Visit: Payer: Self-pay

## 2021-08-22 MED ORDER — CARVEDILOL 25 MG PO TABS: 25.0000 mg | ORAL_TABLET | Freq: Two times a day (BID) | ORAL | 3 refills | Status: DC

## 2021-08-23 ENCOUNTER — Other Ambulatory Visit: Payer: Self-pay | Admitting: Internal Medicine

## 2021-09-03 ENCOUNTER — Other Ambulatory Visit: Payer: Self-pay | Admitting: Internal Medicine

## 2021-09-15 ENCOUNTER — Other Ambulatory Visit: Payer: Self-pay | Admitting: Internal Medicine

## 2021-10-10 ENCOUNTER — Encounter: Payer: Self-pay | Admitting: Internal Medicine

## 2021-10-11 MED ORDER — HYDROCODONE-ACETAMINOPHEN 10-325 MG PO TABS: 1.0000 | ORAL_TABLET | Freq: Four times a day (QID) | ORAL | 0 refills | Status: DC | PRN

## 2021-10-12 ENCOUNTER — Ambulatory Visit (INDEPENDENT_AMBULATORY_CARE_PROVIDER_SITE_OTHER): Payer: Medicare HMO | Admitting: Internal Medicine

## 2021-10-12 ENCOUNTER — Encounter: Payer: Self-pay | Admitting: Internal Medicine

## 2021-10-12 ENCOUNTER — Other Ambulatory Visit: Payer: Self-pay | Admitting: Cardiovascular Disease

## 2021-10-12 ENCOUNTER — Other Ambulatory Visit: Payer: Self-pay

## 2021-10-12 VITALS — BP 130/62 | HR 82 | Temp 96.4°F | Ht 59.0 in | Wt 156.4 lb

## 2021-10-12 DIAGNOSIS — M5416 Radiculopathy, lumbar region: Secondary | ICD-10-CM

## 2021-10-12 DIAGNOSIS — I70209 Unspecified atherosclerosis of native arteries of extremities, unspecified extremity: Secondary | ICD-10-CM

## 2021-10-12 DIAGNOSIS — I2510 Atherosclerotic heart disease of native coronary artery without angina pectoris: Secondary | ICD-10-CM

## 2021-10-12 DIAGNOSIS — J029 Acute pharyngitis, unspecified: Secondary | ICD-10-CM | POA: Diagnosis not present

## 2021-10-12 DIAGNOSIS — R7303 Prediabetes: Secondary | ICD-10-CM

## 2021-10-12 DIAGNOSIS — I10 Essential (primary) hypertension: Secondary | ICD-10-CM | POA: Diagnosis not present

## 2021-10-12 DIAGNOSIS — J069 Acute upper respiratory infection, unspecified: Secondary | ICD-10-CM | POA: Diagnosis not present

## 2021-10-12 DIAGNOSIS — E785 Hyperlipidemia, unspecified: Secondary | ICD-10-CM | POA: Diagnosis not present

## 2021-10-12 MED ORDER — HYDROCODONE-ACETAMINOPHEN 10-325 MG PO TABS: 1.0000 | ORAL_TABLET | Freq: Four times a day (QID) | ORAL | 0 refills | Status: DC | PRN

## 2021-10-12 NOTE — Assessment & Plan Note (Signed)
Reviewed findings of prior CT scan today..  Patient is tolerating rosuvastatin   Lab Results  Component Value Date   LDLCALC 25 07/12/2020   Lab Results  Component Value Date   CHOL 130 07/13/2021   HDL 47.60 07/13/2021   LDLCALC 25 07/12/2020   LDLDIRECT 48.0 07/13/2021   TRIG 223.0 (H) 07/13/2021   CHOLHDL 3 07/13/2021

## 2021-10-12 NOTE — Assessment & Plan Note (Signed)
Secondary to DDD and scoliosis managed with vicodin and methocarbamol  .  She has not had any ER visits  And has not requested any early refills.  Her Refill history was confirmed via [place] Controlled Substance database by me today during her visit and there have been no prescriptions of controlled substances filled from any providers other than me. .Her pain is better controlled on the current regimen and she is staying as  Active as her pain which now involves the hip,  Will allow. However she does not meet criteria for a motorized scooter as requested . rx for manual wheelchair given
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
## 2021-10-12 NOTE — Assessment & Plan Note (Addendum)
She endorses pharyngitis, cough and congestion. without fevers Testing for flu, RSV and COVID underway as she is high probability given exposure. .  Exam not c/w strep pharyngitis

## 2021-10-12 NOTE — Patient Instructions (Signed)
Continue checking your blood pressure once a week . Let me know if readings are >140/80  Check mychart for messages about the flu , COVID and RSV   See you in 3 months
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
## 2021-10-13 ENCOUNTER — Other Ambulatory Visit: Payer: Self-pay | Admitting: Internal Medicine

## 2021-10-13 LAB — COVID-19, FLU A+B AND RSV
Influenza A, NAA: NOT DETECTED
Influenza B, NAA: NOT DETECTED
RSV, NAA: NOT DETECTED
SARS-CoV-2, NAA: NOT DETECTED

## 2021-11-04 ENCOUNTER — Other Ambulatory Visit: Payer: Self-pay | Admitting: Internal Medicine

## 2021-11-09 ENCOUNTER — Ambulatory Visit: Payer: Medicare HMO | Admitting: Internal Medicine

## 2021-11-19 ENCOUNTER — Encounter: Payer: Self-pay | Admitting: Internal Medicine

## 2021-11-22 MED ORDER — ALBUTEROL SULFATE (2.5 MG/3ML) 0.083% IN NEBU: 2.5000 mg | INHALATION_SOLUTION | Freq: Four times a day (QID) | RESPIRATORY_TRACT | 1 refills | Status: AC | PRN

## 2021-11-30 NOTE — Telephone Encounter (Signed)
Patient declined  

## 2022-01-01 ENCOUNTER — Other Ambulatory Visit (INDEPENDENT_AMBULATORY_CARE_PROVIDER_SITE_OTHER): Payer: Medicare HMO

## 2022-01-01 ENCOUNTER — Other Ambulatory Visit: Payer: Self-pay

## 2022-01-01 DIAGNOSIS — I10 Essential (primary) hypertension: Secondary | ICD-10-CM | POA: Diagnosis not present

## 2022-01-01 DIAGNOSIS — R7303 Prediabetes: Secondary | ICD-10-CM | POA: Diagnosis not present

## 2022-01-01 DIAGNOSIS — E785 Hyperlipidemia, unspecified: Secondary | ICD-10-CM | POA: Diagnosis not present

## 2022-01-01 LAB — COMPREHENSIVE METABOLIC PANEL
ALT: 16 U/L (ref 0–35)
AST: 22 U/L (ref 0–37)
Albumin: 4 g/dL (ref 3.5–5.2)
Alkaline Phosphatase: 72 U/L (ref 39–117)
BUN: 17 mg/dL (ref 6–23)
CO2: 26 mEq/L (ref 19–32)
Calcium: 8.7 mg/dL (ref 8.4–10.5)
Chloride: 105 mEq/L (ref 96–112)
Creatinine, Ser: 1.15 mg/dL (ref 0.40–1.20)
GFR: 48.31 mL/min — ABNORMAL LOW (ref >60.00)
Glucose, Bld: 125 mg/dL — ABNORMAL HIGH (ref 70–99)
Potassium: 4.8 mEq/L (ref 3.5–5.1)
Sodium: 139 mEq/L (ref 135–145)
Total Bilirubin: 0.5 mg/dL (ref 0.2–1.2)
Total Protein: 7.4 g/dL (ref 6.0–8.3)

## 2022-01-01 LAB — LIPID PANEL
Cholesterol: 159 mg/dL (ref 0–200)
HDL: 39.6 mg/dL (ref >39.00)
NonHDL: 119.83
Total CHOL/HDL Ratio: 4
Triglycerides: 331 mg/dL — ABNORMAL HIGH (ref 0.0–149.0)
VLDL: 66.2 mg/dL — ABNORMAL HIGH (ref 0.0–40.0)

## 2022-01-01 LAB — HEMOGLOBIN A1C: Hgb A1c MFr Bld: 6.4 % (ref 4.6–6.5)

## 2022-01-01 LAB — LDL CHOLESTEROL, DIRECT: Direct LDL: 63 mg/dL

## 2022-01-02 ENCOUNTER — Encounter: Payer: Self-pay | Admitting: Internal Medicine

## 2022-01-02 ENCOUNTER — Telehealth: Payer: Self-pay

## 2022-01-02 ENCOUNTER — Ambulatory Visit (INDEPENDENT_AMBULATORY_CARE_PROVIDER_SITE_OTHER): Payer: Medicare HMO | Admitting: Internal Medicine

## 2022-01-02 ENCOUNTER — Other Ambulatory Visit: Payer: Self-pay

## 2022-01-02 VITALS — BP 164/72 | HR 78 | Temp 98.5°F | Ht 59.0 in | Wt 158.0 lb

## 2022-01-02 DIAGNOSIS — I10 Essential (primary) hypertension: Secondary | ICD-10-CM

## 2022-01-02 DIAGNOSIS — R7301 Impaired fasting glucose: Secondary | ICD-10-CM | POA: Diagnosis not present

## 2022-01-02 DIAGNOSIS — M5416 Radiculopathy, lumbar region: Secondary | ICD-10-CM

## 2022-01-02 DIAGNOSIS — E1159 Type 2 diabetes mellitus with other circulatory complications: Secondary | ICD-10-CM | POA: Diagnosis not present

## 2022-01-02 DIAGNOSIS — I70209 Unspecified atherosclerosis of native arteries of extremities, unspecified extremity: Secondary | ICD-10-CM | POA: Diagnosis not present

## 2022-01-02 DIAGNOSIS — E1169 Type 2 diabetes mellitus with other specified complication: Secondary | ICD-10-CM

## 2022-01-02 DIAGNOSIS — N183 Chronic kidney disease, stage 3 unspecified: Secondary | ICD-10-CM

## 2022-01-02 DIAGNOSIS — N281 Cyst of kidney, acquired: Secondary | ICD-10-CM | POA: Diagnosis not present

## 2022-01-02 DIAGNOSIS — E669 Obesity, unspecified: Secondary | ICD-10-CM

## 2022-01-02 MED ORDER — HYDROCODONE-ACETAMINOPHEN 10-325 MG PO TABS: 1.0000 | ORAL_TABLET | Freq: Four times a day (QID) | ORAL | 0 refills | Status: DC | PRN

## 2022-01-02 MED ORDER — ROSUVASTATIN CALCIUM 40 MG PO TABS: ORAL_TABLET | ORAL | 1 refills | Status: DC

## 2022-01-02 MED ORDER — METFORMIN HCL 500 MG PO TABS: 500.0000 mg | ORAL_TABLET | Freq: Two times a day (BID) | ORAL | 3 refills | Status: DC

## 2022-01-02 MED ORDER — CARVEDILOL 25 MG PO TABS: 25.0000 mg | ORAL_TABLET | Freq: Two times a day (BID) | ORAL | 3 refills | Status: DC

## 2022-01-02 NOTE — Assessment & Plan Note (Signed)
Reviewed with patient the need to keep  BP to be < 130/80.  She is avoiding NSAIDS.  Lab Results  Component Value Date   CREATININE 1.15 01/01/2022   . Lab Results  Component Value Date   NA 139 01/01/2022   K 4.8 01/01/2022   CL 105 01/01/2022   CO2 26 01/01/2022   Lab Results  Component Value Date   MICROALBUR 1.0 07/13/2021   MICROALBUR <0.7 12/13/2016

## 2022-01-02 NOTE — Assessment & Plan Note (Signed)
Reviewed findings of prior CT scan today..  Patient is tolerating rosuvastatin . Refills sent   Lab Results  Component Value Date   LDLCALC 25 07/12/2020   Lab Results  Component Value Date   CHOL 159 01/01/2022   HDL 39.60 01/01/2022   LDLCALC 25 07/12/2020   LDLDIRECT 63.0 01/01/2022   TRIG 331.0 (H) 01/01/2022   CHOLHDL 4 01/01/2022

## 2022-01-02 NOTE — Assessment & Plan Note (Signed)
Secondary to DDD and scoliosis managed with vicodin and methocarbamol.  NSAIDS  C/i due to CKD   .  She has not had any ER visits  And has not requested any early refills.  Her Refill history was confirmed via [place] Controlled Substance database by me today during her visit and there have been no prescriptions of controlled substances filled from any providers other than me. .Her pain is better controlled on the current regimen and she is staying as  Active as her pain which now.  Vicodin refilled for 3 months.

## 2022-01-02 NOTE — Assessment & Plan Note (Addendum)
:    noted again during Jan 2022 colonoscopy done in Jan noted a 1.7 cm mass on her solitary left  kidney.  Patient was advised via mychart and MRI abdomen was recommended.  She did not respond.  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  She has seen urology and declines further workup.
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
## 2022-01-02 NOTE — Assessment & Plan Note (Signed)
Type 2 DM now suggested by FG pf 125 and a1c of 6.4 .  Metformin recommended.    ozempic discussed but declined.  reminded to notify her optometrist and begin annual exams.  Referral to CCM

## 2022-01-02 NOTE — Chronic Care Management (AMB) (Signed)
Chronic Care Management   Note  01/02/2022 Name: Suzanne Cole MRN: [id number] DOB: Sep 25, 1951  Suzanne Cole is a 71 y.o. year old female who is a primary care patient of [name], MD. I reached out to [employer] by phone today in response to a referral sent by Suzanne Cole PCP.  Suzanne Cole was given information about Chronic Care Management services today including:  CCM service includes personalized support from designated clinical staff supervised by her physician, including individualized plan of care and coordination with other care providers 24/7 contact phone numbers for assistance for urgent and routine care needs. Service will only be billed when office clinical staff spend 20 minutes or more in a month to coordinate care. Only one practitioner may furnish and bill the service in a calendar month. The patient may stop CCM services at any time (effective at the end of the month) by phone call to the office staff. The patient is responsible for co-pay (up to 20% after annual deductible is met) if co-pay is required by the individual health plan.   Patient agreed to services and verbal consent obtained.   Follow up plan: Telephone appointment with care management team member scheduled for:01/15/2022  Noreene Larsson, [address], [place], [place] [address] Direct Dial: [phone] [email or web address]

## 2022-01-02 NOTE — Patient Instructions (Addendum)
You have developed type 2 diabetes mellitus based on your fasting glucose of 125 and your A1c of 6.4  I am recommending that you start taking metformin  because this helps curb your sweet tooth and is helpful I preventing progression of fatty liver   I am letting you know that I am referring to our clinical pharmacist ,  Catie Darnelle Maffucci.  Catie helps me provide additional services to my patients who are  dealing with  chronic diseases , like diabetes.  I do not expect this referral to cost you anything out of pocket,  but if there is a charge,  the office will let you know before you have the visit.  Catie has been very helpful in getting patient's diabetes under control and maximizing their  drug benefits.  She may make contact with  you by phone in the next week e

## 2022-01-03 ENCOUNTER — Encounter: Payer: Self-pay | Admitting: Internal Medicine

## 2022-01-15 ENCOUNTER — Ambulatory Visit: Payer: Medicare HMO | Admitting: Pharmacist

## 2022-01-15 NOTE — Patient Instructions (Signed)
Teshara,  ? ?Keep up the great work! ? ?Catie Darnelle Maffucci, PharmD ?

## 2022-01-15 NOTE — Progress Notes (Signed)
? ?   ? ?Chief Complaint  ?Patient presents with  ? Chronic Care Management  ?  Diabetes  ? ? ?Suzanne Cole is a 71 y.o. year old female who was referred for medication management by their primary care provider, [name], MD. They presented for a telephone visit in the context of the COVID-19 pandemic. ? ? ?Subjective: ?Pre-diabetes in the setting of CKD: ? ?Current medications: metformin 500 mg twice daily - prescribed, but she has not started due to reading about side effects. Desires to focus on dietary and lifestyle intervention.  ? ?Current meal patterns: utilizing glycemic diet handout  ?- Breakfast: coffee;  ?- Lunch: cashew butter and jelly sandwiches; wheat bread ?- Supper: husband works third shift, so they don't eat together. Usually has a sandwich. ?- Snacks: [employer], Fudgecicles - especially when she is hot and needs to cool down ?- Drinks: was drinking a lot of soda, now is getting smaller cans. Water  ? ?Avoids red meat. Uses whole grain bread.  ? ?Current physical activity: limited by pain, using pedal machine ? ?Hypertension in the setting of CKD: ? ?Current medications: amlodipine 10 mg daily, carvedilol 25 mg twice daily ? ?Current blood pressure readings readings: 125-136/60-70s ? ? ? ?Objective: ?Lab Results  ?Component Value Date  ? HGBA1C 6.4 01/01/2022  ? ? ?Lab Results  ?Component Value Date  ? CREATININE 1.15 01/01/2022  ? BUN 17 01/01/2022  ? NA 139 01/01/2022  ? K 4.8 01/01/2022  ? CL 105 01/01/2022  ? CO2 26 01/01/2022  ? ? ?Lab Results  ?Component Value Date  ? CHOL 159 01/01/2022  ? HDL 39.60 01/01/2022  ? [place] 25 07/12/2020  ? LDLDIRECT 63.0 01/01/2022  ? TRIG 331.0 (H) 01/01/2022  ? CHOLHDL 4 01/01/2022  ? ? ?Medications Reviewed Today   ? ? Reviewed by De Hollingshead, RPH-CPP (Pharmacist) on 01/15/22 at [place] List Status: <None>  ? ?Medication Order Taking? Sig Documenting Provider Last Dose Status Informant  ?albuterol (PROVENTIL) (2.5 MG/3ML)  0.083% nebulizer solution [id number]  Take 3 mLs (2.5 mg total) by nebulization every 6 (six) hours as needed for wheezing or shortness of breath. [name], MD  Active   ?albuterol (VENTOLIN HFA) 108 (90 Base) MCG/ACT inhaler [id number]  INHALE 2 PUFFS BY MOUTH EVERY 6 HOURS AS NEEDED FOR WHEEZING OR SHORTNESS OF BREATH [name], MD  Active   ?amLODipine (NORVASC) 10 MG tablet [id number] Yes TAKE 1 TABLET(10 MG) BY MOUTH DAILY FOR HIGH BLOOD PRESSURE [name], MD Taking Active   ?aspirin 81 MG tablet [id number] Yes Take 81 mg by mouth as needed for pain. [provider] Taking Active   ?carvedilol (COREG) 25 MG tablet [id number] Yes Take 1 tablet (25 mg total) by mouth 2 (two) times daily. [name], MD Taking Active   ?cetirizine (ZYRTEC) 10 MG tablet [id number] Yes Take 10 mg by mouth daily. [provider] Taking Active   ?Cholecalciferol (VITAMIN D3) 1000 units CHEW [id number] Yes Chew by mouth daily.  [provider] Taking Active   ?clopidogrel (PLAVIX) 75 MG tablet [id number] Yes TAKE 1 TABLET BY MOUTH ONCE DAILY Dunn, Areta Haber, PA-C Taking Active   ?fluticasone (FLONASE) 50 MCG/ACT nasal spray [id number] Yes Place 2 sprays into both nostrils daily. [name], MD Taking Active   ?gabapentin (NEURONTIN) 300 MG capsule [id number] Yes TAKE 1 CAPSULE(300 MG) BY MOUTH THREE TIMES DAILY [name], MD Taking  Active   ?HYDROcodone-acetaminophen (NORCO) 10-325 MG tablet [id number]  Take 1 tablet by mouth every 6 (six) hours as needed for severe pain. [name], MD  Active   ?HYDROcodone-acetaminophen (NORCO) 10-325 MG tablet [id number]  Take 1 tablet by mouth every 6 (six) hours as needed. [name], MD  Active   ?HYDROcodone-acetaminophen (NORCO) 10-325 MG tablet [id number] Yes Take 1 tablet by mouth every 6 (six) hours as needed. [name], MD Taking Active   ?metFORMIN (GLUCOPHAGE) 500 MG tablet [id number] No Take 1 tablet (500 mg total) by mouth 2  (two) times daily with a meal.  ?Patient not taking: Reported on 01/15/2022  ? [name], MD Not Taking Active   ?methocarbamol (ROBAXIN) 750 MG tablet [id number]  TAKE 1 TABLET(750 MG) BY MOUTH FOUR TIMES DAILY [name], MD  Active   ?pantoprazole (PROTONIX) 40 MG tablet [id number] Yes TAKE 1 TABLET(40 MG) BY MOUTH DAILY [name], MD Taking Active   ?rosuvastatin (CRESTOR) 40 MG tablet [id number] Yes TAKE 1 TABLET(40 MG) BY MOUTH DAILY [name], MD Taking Active   ?triamcinolone cream (KENALOG) 0.1 % [id number]  Apply 1 application topically 2 (two) times daily. Leone Haven, MD  Active   ? ?  ?  ? ?  ? ? ?Assessment/Plan:  ? ?Pre-diabetes: ?- Currently uncontrolled ?- Reviewed long term cardiovascular and renal outcomes of uncontrolled blood sugar ?- Reviewed goal A1c, goal fasting, and goal 2 hour post prandial glucose ?- Reviewed dietary modifications including focus on lean proteins, vegetables, moderation with carbohydrate intake and increase in fiber ?- She declines to start metformin. Encouraged to continue to focus on lifestyle modifications until next PCP appointment ? ?Hypertension: ?- Currently uncontrolled at goal <130/80 ?- Reviewed long term cardiovascular and renal outcomes of uncontrolled blood pressure ?- Reviewed appropriate blood pressure monitoring technique and reviewed goal blood pressure. Recommended to check home blood pressure and heart rate periodically ?- Discussed low sodium intake ?- Recommend to continue current regimen  ? ? ? ?Follow Up Plan: follow up with PCP in 3 months ? ?Catie Darnelle Maffucci, PharmD, BCACP, CPP ?Clinical Pharmacist ?Therapist, music at [employer] ?[phone] ? ? ? ?

## 2022-02-05 ENCOUNTER — Other Ambulatory Visit: Payer: Self-pay

## 2022-02-05 ENCOUNTER — Encounter: Payer: Self-pay | Admitting: Internal Medicine

## 2022-02-05 DIAGNOSIS — I25810 Atherosclerosis of coronary artery bypass graft(s) without angina pectoris: Secondary | ICD-10-CM

## 2022-02-05 MED ORDER — CLOPIDOGREL BISULFATE 75 MG PO TABS: 75.0000 mg | ORAL_TABLET | Freq: Every day | ORAL | 0 refills | Status: DC

## 2022-02-06 ENCOUNTER — Telehealth: Payer: Self-pay | Admitting: Cardiovascular Disease

## 2022-02-06 NOTE — Telephone Encounter (Signed)
Left msg to inform patient she needs a follow up appointment for further refills.

## 2022-02-07 ENCOUNTER — Other Ambulatory Visit: Payer: Self-pay | Admitting: Internal Medicine

## 2022-02-07 DIAGNOSIS — I25810 Atherosclerosis of coronary artery bypass graft(s) without angina pectoris: Secondary | ICD-10-CM

## 2022-02-12 ENCOUNTER — Telehealth: Payer: Self-pay | Admitting: Internal Medicine

## 2022-02-12 NOTE — Telephone Encounter (Signed)
Copied from [place] [id number]. Topic: Medicare AWV ?>> Feb 12, 2022 10:55 AM Harris-Coley, Hannah Beat wrote: ?Reason for CRM: LVM 02/12/22 to r/s AWV due to holiday.  New AWV appt date 02/18/22 '@2'$ :45pm.  Please confirm appt date change khc ?

## 2022-02-15 ENCOUNTER — Ambulatory Visit: Payer: Medicare HMO

## 2022-02-17 ENCOUNTER — Other Ambulatory Visit: Payer: Self-pay | Admitting: Internal Medicine

## 2022-02-18 ENCOUNTER — Ambulatory Visit: Payer: Medicare HMO

## 2022-03-06 NOTE — Telephone Encounter (Signed)
Attempted to schedule lmov  

## 2022-03-26 DIAGNOSIS — Z1231 Encounter for screening mammogram for malignant neoplasm of breast: Secondary | ICD-10-CM | POA: Diagnosis not present

## 2022-03-26 LAB — HM MAMMOGRAPHY

## 2022-03-27 ENCOUNTER — Telehealth: Payer: Self-pay | Admitting: Internal Medicine

## 2022-03-27 DIAGNOSIS — E785 Hyperlipidemia, unspecified: Secondary | ICD-10-CM

## 2022-03-27 NOTE — Telephone Encounter (Signed)
Patient has a lab appt 5/23 there are no orders in. ?

## 2022-03-27 NOTE — Addendum Note (Signed)
Addended by: [name] on: 03/27/2022 04:09 PM ? ? Modules accepted: Orders ? ?

## 2022-03-29 ENCOUNTER — Other Ambulatory Visit: Payer: Self-pay

## 2022-03-29 MED ORDER — METHOCARBAMOL 750 MG PO TABS: 750.0000 mg | ORAL_TABLET | Freq: Four times a day (QID) | ORAL | 1 refills | Status: DC

## 2022-04-01 ENCOUNTER — Ambulatory Visit: Payer: Medicare HMO | Admitting: Internal Medicine

## 2022-04-02 ENCOUNTER — Other Ambulatory Visit (INDEPENDENT_AMBULATORY_CARE_PROVIDER_SITE_OTHER): Payer: Medicare HMO

## 2022-04-02 DIAGNOSIS — E785 Hyperlipidemia, unspecified: Secondary | ICD-10-CM

## 2022-04-02 LAB — COMPREHENSIVE METABOLIC PANEL
ALT: 6 U/L (ref 0–35)
AST: 12 U/L (ref 0–37)
Albumin: 4 g/dL (ref 3.5–5.2)
Alkaline Phosphatase: 61 U/L (ref 39–117)
BUN: 18 mg/dL (ref 6–23)
CO2: 27 mEq/L (ref 19–32)
Calcium: 8.8 mg/dL (ref 8.4–10.5)
Chloride: 107 mEq/L (ref 96–112)
Creatinine, Ser: 1.03 mg/dL (ref 0.40–1.20)
GFR: 55.05 mL/min — ABNORMAL LOW (ref >60.00)
Glucose, Bld: 123 mg/dL — ABNORMAL HIGH (ref 70–99)
Potassium: 4.9 mEq/L (ref 3.5–5.1)
Sodium: 141 mEq/L (ref 135–145)
Total Bilirubin: 0.6 mg/dL (ref 0.2–1.2)
Total Protein: 7.3 g/dL (ref 6.0–8.3)

## 2022-04-02 LAB — LIPID PANEL
Cholesterol: 110 mg/dL (ref 0–200)
HDL: 46.3 mg/dL (ref >39.00)
LDL Cholesterol: 28 mg/dL (ref 0–99)
NonHDL: 63.41
Total CHOL/HDL Ratio: 2
Triglycerides: 178 mg/dL — ABNORMAL HIGH (ref 0.0–149.0)
VLDL: 35.6 mg/dL (ref 0.0–40.0)

## 2022-04-03 ENCOUNTER — Encounter: Payer: Self-pay | Admitting: Internal Medicine

## 2022-04-03 ENCOUNTER — Ambulatory Visit (INDEPENDENT_AMBULATORY_CARE_PROVIDER_SITE_OTHER): Payer: Medicare HMO | Admitting: Internal Medicine

## 2022-04-03 VITALS — BP 122/54 | HR 78 | Temp 98.3°F | Ht 59.0 in | Wt 158.6 lb

## 2022-04-03 DIAGNOSIS — N183 Chronic kidney disease, stage 3 unspecified: Secondary | ICD-10-CM

## 2022-04-03 DIAGNOSIS — E785 Hyperlipidemia, unspecified: Secondary | ICD-10-CM | POA: Diagnosis not present

## 2022-04-03 DIAGNOSIS — E1159 Type 2 diabetes mellitus with other circulatory complications: Secondary | ICD-10-CM | POA: Diagnosis not present

## 2022-04-03 DIAGNOSIS — M5416 Radiculopathy, lumbar region: Secondary | ICD-10-CM

## 2022-04-03 DIAGNOSIS — I70209 Unspecified atherosclerosis of native arteries of extremities, unspecified extremity: Secondary | ICD-10-CM

## 2022-04-03 DIAGNOSIS — I700 Atherosclerosis of aorta: Secondary | ICD-10-CM | POA: Diagnosis not present

## 2022-04-03 DIAGNOSIS — E669 Obesity, unspecified: Secondary | ICD-10-CM | POA: Diagnosis not present

## 2022-04-03 DIAGNOSIS — I130 Hypertensive heart and chronic kidney disease with heart failure and stage 1 through stage 4 chronic kidney disease, or unspecified chronic kidney disease: Secondary | ICD-10-CM

## 2022-04-03 DIAGNOSIS — I152 Hypertension secondary to endocrine disorders: Secondary | ICD-10-CM

## 2022-04-03 DIAGNOSIS — E1169 Type 2 diabetes mellitus with other specified complication: Secondary | ICD-10-CM | POA: Diagnosis not present

## 2022-04-03 DIAGNOSIS — I5030 Unspecified diastolic (congestive) heart failure: Secondary | ICD-10-CM

## 2022-04-03 LAB — POCT GLYCOSYLATED HEMOGLOBIN (HGB A1C): Hemoglobin A1C: 6 % — AB (ref 4.0–5.6)

## 2022-04-03 MED ORDER — HYDROCODONE-ACETAMINOPHEN 10-325 MG PO TABS: 1.0000 | ORAL_TABLET | Freq: Four times a day (QID) | ORAL | 0 refills | Status: DC | PRN

## 2022-04-03 NOTE — Assessment & Plan Note (Signed)
Stable.  Continue aggressive control of BP and avoiding of  NSAIDS.  Lab Results  Component Value Date   CREATININE 1.03 04/02/2022   . Lab Results  Component Value Date   NA 141 04/02/2022   K 4.9 04/02/2022   CL 107 04/02/2022   CO2 27 04/02/2022   Lab Results  Component Value Date   MICROALBUR 1.0 07/13/2021   MICROALBUR <0.7 12/13/2016

## 2022-04-03 NOTE — Assessment & Plan Note (Signed)
Secondary to DDD and scoliosis managed with vicodin and methocarbamol.  NSAIDS  C/i due to CKD   .  She has not had any ER visits  And has not requested any early refills.  Her Refill history was confirmed via [place] Controlled Substance database by me today during her visit and there have been no prescriptions of controlled substances filled from any providers other than me. .Her pain is better controlled on the current regimen and she is staying as  Active as her pain which now.  Vicodin refilled for 3 months.

## 2022-04-03 NOTE — Assessment & Plan Note (Signed)
Reviewed findings of prior CT scan today..  Patient is tolerating rosuvastatin and LDL is well below 70  . Lab Results  Component Value Date   LDLCALC 28 04/02/2022   Lab Results  Component Value Date   CHOL 110 04/02/2022   HDL 46.30 04/02/2022   LDLCALC 28 04/02/2022   LDLDIRECT 63.0 01/01/2022   TRIG 178.0 (H) 04/02/2022   CHOLHDL 2 04/02/2022

## 2022-04-03 NOTE — Assessment & Plan Note (Addendum)
Type 2 DM confirmed with last a1c of  6.4 and fasting glucose of 125  .  Metformin recommended but deferred by patient.  zempic discussed but declined. She has reduced a1c to 6.0 with better diet.  Again reminded  to notify her optometrist and begin annual exams.     Lab Results  Component Value Date   HGBA1C 6.0 (A) 04/03/2022    Lab Results  Component Value Date   MICROALBUR 1.0 07/13/2021   MICROALBUR <0.7 12/13/2016

## 2022-04-03 NOTE — Patient Instructions (Addendum)
   Your diabetes remains under excellent control  And your cholesterol and other labs are also normal. Please continue your current DIET ,  increase your exercise and return in 3 months.  We do not need to do bloodwork until your November appt, however.    Please schedule your annual  "Diabetic Eye Exam"  ASAP

## 2022-04-03 NOTE — Assessment & Plan Note (Signed)
Reviewed findings of prior CT scan today..  Patient is tolerating high potency statin therapy and LDL is < 70

## 2022-04-03 NOTE — Assessment & Plan Note (Signed)
Well controlled on current regimen. Renal function stable, no changes today.  Continue amlodipine.  Lab Results  Component Value Date   NA 141 04/02/2022   K 4.9 04/02/2022   CL 107 04/02/2022   CO2 27 04/02/2022

## 2022-04-03 NOTE — Progress Notes (Signed)
Subjective:  Patient ID: Suzanne Cole, female    DOB: 1951/05/04  Age: 71 y.o. MRN: [id number]  CC: The primary encounter diagnosis was Obesity, diabetes, and hypertension syndrome ([place]). 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   HPI [employer] presents for  Chief Complaint  Patient presents with   Follow-up    3 month follow up on diabetes and medication refills    1) T2DM:  not taking metformin.  Never started it.  Not checking BS.  Diet reviewed:  breakfast is usually skipped , or she eats "snackbreaks" or blueberry corn dogs.   Skips lunch.  Dinner varies; husband works 3rd shift and is usually microwaveable dinners bc back pain limits her standing.  Using the cubie for exercise but planning to open the pool.   2)  chronic back pain:  using hydrocodone  NSAIDS C/I due  to CKD and use of plavix   Lab Results  Component Value Date   HGBA1C 6.4 01/01/2022     Outpatient Medications Prior to Visit  Medication Sig Dispense Refill   albuterol (PROVENTIL) (2.5 MG/3ML) 0.083% nebulizer solution Take 3 mLs (2.5 mg total) by nebulization every 6 (six) hours as needed for wheezing or shortness of breath. 150 mL 1   albuterol (VENTOLIN HFA) 108 (90 Base) MCG/ACT inhaler INHALE 2 PUFFS BY MOUTH EVERY 6 HOURS AS NEEDED FOR WHEEZING OR SHORTNESS OF BREATH 18 g 2   amLODipine (NORVASC) 10 MG tablet TAKE 1 TABLET(10 MG) BY MOUTH DAILY FOR HIGH BLOOD PRESSURE 90 tablet 1   aspirin 81 MG tablet Take 81 mg by mouth as needed for pain.     carvedilol (COREG) 25 MG tablet Take 1 tablet (25 mg total) by mouth 2 (two) times daily. 180 tablet 3   cetirizine (ZYRTEC) 10 MG  tablet Take 10 mg by mouth daily.     Cholecalciferol (VITAMIN D3) 1000 units CHEW Chew by mouth daily.      clopidogrel (PLAVIX) 75 MG tablet TAKE 1 TABLET(75 MG) BY MOUTH DAILY 90 tablet 0   fluticasone (FLONASE) 50 MCG/ACT nasal spray Place 2 sprays into both nostrils daily. 16 g 6   gabapentin (NEURONTIN) 300 MG capsule TAKE 1 CAPSULE(300 MG) BY MOUTH THREE TIMES DAILY 270 capsule 1   methocarbamol (ROBAXIN) 750 MG tablet Take 1 tablet (750 mg total) by mouth 4 (four) times daily. 360 tablet 1   pantoprazole (PROTONIX) 40 MG tablet TAKE 1 TABLET(40 MG) BY MOUTH DAILY 90 tablet 3   rosuvastatin (CRESTOR) 40 MG tablet TAKE 1 TABLET(40 MG) BY MOUTH DAILY 90 tablet 1   triamcinolone cream (KENALOG) 0.1 % Apply 1 application topically 2 (two) times daily. 30 g 0   HYDROcodone-acetaminophen (NORCO) 10-325 MG tablet Take 1 tablet by mouth every 6 (six) hours as needed for severe pain. 120 tablet 0   HYDROcodone-acetaminophen (NORCO) 10-325 MG tablet Take 1 tablet by mouth every 6 (six) hours as needed. 120 tablet 0   HYDROcodone-acetaminophen (NORCO) 10-325 MG tablet Take 1 tablet by mouth every 6 (six) hours as needed. 120 tablet 0   metFORMIN (GLUCOPHAGE) 500 MG tablet Take 1 tablet (500 mg total) by mouth 2 (two) times daily with a meal. (Patient  not taking: Reported on 04/03/2022) 180 tablet 3   No facility-administered medications prior to visit.    Review of Systems;  Patient denies headache, fevers, malaise, unintentional weight loss, skin rash, eye pain, sinus congestion and sinus pain, sore throat, dysphagia,  hemoptysis , cough, dyspnea, wheezing, chest pain, palpitations, orthopnea, edema, abdominal pain, nausea, melena, diarrhea, constipation, flank pain, dysuria, hematuria, urinary  Frequency, nocturia, numbness, tingling, seizures,  Focal weakness, Loss of consciousness,  Tremor, insomnia, depression, anxiety, and suicidal ideation.      Objective:  BP (!) 122/54 (BP Location: Left  Arm, Patient Position: Sitting, Cuff Size: Large)   Pulse 78   Temp 98.3 F (36.8 C) (Oral)   Ht '4\' 11"'$  (1.499 m)   Wt 158 lb 9.6 oz (71.9 kg)   SpO2 96%   BMI 32.03 kg/m   BP Readings from Last 3 Encounters:  04/03/22 (!) 122/54  01/02/22 (!) 164/72  10/12/21 130/62    Wt Readings from Last 3 Encounters:  04/03/22 158 lb 9.6 oz (71.9 kg)  01/02/22 158 lb (71.7 kg)  10/12/21 156 lb 6.4 oz (70.9 kg)    General appearance: alert, cooperative and appears stated age Ears: normal TM's and external ear canals both ears Throat: lips, mucosa, and tongue normal; teeth and gums normal Neck: no adenopathy, no carotid bruit, supple, symmetrical, trachea midline and thyroid not enlarged, symmetric, no tenderness/mass/nodules Back: symmetric, no curvature. ROM normal. No CVA tenderness. Lungs: clear to auscultation bilaterally Heart: regular rate and rhythm, S1, S2 normal, no murmur, click, rub or gallop Abdomen: soft, non-tender; bowel sounds normal; no masses,  no organomegaly Pulses: 2+ and symmetric Skin: Skin color, texture, turgor normal. No rashes or lesions Lymph nodes: Cervical, supraclavicular, and axillary nodes normal.  Lab Results  Component Value Date   HGBA1C 6.4 01/01/2022   HGBA1C 6.2 07/13/2021   HGBA1C 6.1 01/10/2021    Lab Results  Component Value Date   CREATININE 1.03 04/02/2022   CREATININE 1.15 01/01/2022   CREATININE 1.00 07/19/2021    Lab Results  Component Value Date   WBC 8.6 01/29/2018   HGB 12.0 01/29/2018   HCT 35.7 (L) 01/29/2018   PLT 316.0 01/29/2018   GLUCOSE 123 (H) 04/02/2022   CHOL 110 04/02/2022   TRIG 178.0 (H) 04/02/2022   HDL 46.30 04/02/2022   LDLDIRECT 63.0 01/01/2022   LDLCALC 28 04/02/2022   ALT 6 04/02/2022   AST 12 04/02/2022   NA 141 04/02/2022   K 4.9 04/02/2022   CL 107 04/02/2022   CREATININE 1.03 04/02/2022   BUN 18 04/02/2022   CO2 27 04/02/2022   TSH 1.29 08/03/2015   INR 0.9 11/28/2014   HGBA1C 6.4  01/01/2022   MICROALBUR 1.0 07/13/2021    CT VIRTUAL COLONOSCOPY DIAGNOSTIC  Result Date: 11/17/2020 CLINICAL DATA:  Incomplete colonoscopy, positive Cologuard test. 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 EXAM: CT VIRTUAL COLONOSCOPY DIAGNOSTIC TECHNIQUE: The patient was given a standard Mag citrate bowel preparation with Gastrografin and barium for fluid and stool tagging respectively. The quality of the bowel preparation is moderate. Automated CO2 insufflation of the colon was performed prior to image acquisition and colonic distention is moderate. Image post processing was used to generate a 3D endoluminal fly-through projection of the colon and to electronically subtract stool/fluid as appropriate. COMPARISON:  None. FINDINGS: VIRTUAL COLONOSCOPY Mild layering fluid/contrast in the colon, which shifts positions on supine/prone imaging, and does not constraining evaluation. No significant colonic polyp, mass, apple  core lesion, or stricture. Mild sigmoid diverticulosis, without evidence of diverticulitis. No evidence of bowel obstruction. Normal appendix (series 5/image 75). Virtual colonoscopy is not designed to detect diminutive polyps (i.e., less than or equal to 5 mm), the presence or absence of which may not affect clinical management. CT ABDOMEN AND PELVIS WITHOUT CONTRAST Lower chest: 3 mm perifissural nodule along the left fissure (series 10/image 2), benign. Hepatobiliary: Moderate to severe hepatic steatosis. Gallbladder is unremarkable. No intrahepatic or extrahepatic ductal dilatation. Pancreas: Within normal limits. Spleen: Within normal limits. Adrenals/Urinary Tract: Adrenal glands are within normal limits. Status post right nephrectomy. 2.3 cm left upper pole renal cyst. Left lower pole renal sinus cyst. 1.7 cm hyperdense left lower pole renal lesion (series 5/image 60), likely reflecting a hemorrhagic cyst, although technically indeterminate. No hydronephrosis. Bladder  is decompressed. Stomach/Bowel: Stomach is within normal limits. Visualized bowel as described above. Vascular/Lymphatic: No evidence of abdominal aortic aneurysm. Atherosclerotic calcifications of the abdominal aorta and branch vessels. No suspicious abdominopelvic lymphadenopathy. Reproductive: Status post hysterectomy. No adnexal masses. Other: No abdominopelvic ascites. Musculoskeletal: Degenerative changes of the visualized thoracolumbar spine. Mild lumbar levoscoliosis. IMPRESSION: No significant colonic polyp, mass, apple core lesion, or stricture. Mild sigmoid diverticulosis, without evidence of diverticulitis. Status post right nephrectomy. No evidence of recurrent or metastatic disease. 1.7 cm hyperdense left lower pole renal lesion, likely reflecting a hemorrhagic cyst, although technically indeterminate. Correlate with prior imaging, if available. Otherwise, consider MRI abdomen with/without contrast for further characterization, as clinically warranted. Electronically Signed   By: Julian Hy M.D.   On: 11/17/2020 11:35    Assessment & Plan:   Problem List Items Addressed This Visit     Atherosclerotic peripheral vascular disease ([place])    Reviewed findings of prior CT scan today..  Patient is tolerating rosuvastatin and LDL is well below 70  . 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 Renal function stable, no changes today.  Continue amlodipine.  Lab Results  Component Value Date   NA 141 04/02/2022   K 4.9 04/02/2022   CL 107 04/02/2022   CO2 27 04/02/2022        CKD (chronic kidney disease), symptom management only,  stage 3 (moderate) (HCC)    Stable.  Continue aggressive control of BP and avoiding of  NSAIDS.  Lab Results  Component Value Date   CREATININE 1.03 04/02/2022   . Lab Results  Component Value Date   NA 141 04/02/2022   K 4.9 04/02/2022   CL 107 04/02/2022   CO2 27 04/02/2022   Lab Results  Component Value Date   MICROALBUR 1.0 07/13/2021   MICROALBUR <0.7 12/13/2016           Hyperlipidemia LDL goal <70   Relevant Orders   Lipid panel   LDL cholesterol, direct   Lumbar radiculitis    Secondary to DDD and scoliosis managed with vicodin and methocarbamol.  NSAIDS  C/i due to CKD   .  She has not had any ER visits  And has not requested any early refills.  Her Refill history was confirmed via [place] Controlled Substance database by me today during her visit and there have been no prescriptions of controlled substances filled from any providers other than me. .Her pain is better controlled on the current regimen and she is staying as  Active as her pain which now.  Vicodin refilled for 3 months.        Obesity, diabetes, and hypertension syndrome ([place]) - Primary    Type 2 DM confirmed with last a1c of  6.4 and fasting glucose of 125  .  Metformin recommended but deferred by patient.  zempic discussed but declined. She has reduced a1c to 6.0 with better diet.  Again reminded  to notify her optometrist and begin annual exams.    Lab Results  Component Value Date   MICROALBUR 1.0 07/13/2021   MICROALBUR <0.7 12/13/2016           Relevant Orders   Comprehensive metabolic panel   Hemoglobin A1c   POCT HgB A1C   Thoracic aortic atherosclerosis ([place])    Reviewed findings of prior CT scan today..  Patient is tolerating high potency statin therapy and LDL is < 70          I spent a total of  29 minutes with this patient in a face to face visit on the date of this encounter reviewing the last office visit with me in February  her previous and most recent labs indicating  type 2 Diabetes,  her  most recent with patient's cardiologist in August 2021, patient's  current dietary choices and exercise regimen , home weights and  home blood pressure readings ,  her previous CT scans indicating aortic atheroscerosis,  and post visit ordering of testing and therapeutics.    Follow-up: Return in about 3 months (around 07/04/2022) for follow up diabetes.   [name], MD

## 2022-04-09 ENCOUNTER — Encounter: Payer: Self-pay | Admitting: Internal Medicine

## 2022-04-24 ENCOUNTER — Telehealth: Payer: Self-pay | Admitting: Internal Medicine

## 2022-04-24 NOTE — Telephone Encounter (Signed)
Copied from [place] [id number]. Topic: Medicare AWV >> Apr 24, 2022  3:07 PM Devoria Glassing wrote: Reason for CRM: Left message for patient to schedule Annual Wellness Visit.  Please schedule with Nurse Health Advisor Denisa O'Brien-Blaney, LPN at [facility].  Please call [phone] ask for [facility]

## 2022-05-16 ENCOUNTER — Telehealth: Payer: Self-pay | Admitting: Internal Medicine

## 2022-05-16 NOTE — Telephone Encounter (Signed)
Spoke with patient to sched AWV she req CB next week 05/20/22

## 2022-05-27 ENCOUNTER — Telehealth: Payer: Self-pay | Admitting: Internal Medicine

## 2022-05-27 NOTE — Telephone Encounter (Signed)
Copied from [place] [id number]. Topic: Medicare AWV >> May 27, 2022  9:41 AM Devoria Glassing wrote: Reason for CRM: Left message for patient to schedule Annual Wellness Visit.  Please schedule with Nurse Health Advisor Denisa O'Brien-Blaney, LPN at [facility]. This appt can be telephone or office visit.  Please call [phone] ask for [facility]

## 2022-06-04 ENCOUNTER — Telehealth: Payer: Self-pay | Admitting: Cardiovascular Disease

## 2022-06-04 NOTE — Telephone Encounter (Signed)
Attempted to contact patient to follow up on myChart message. LVM to return the call.

## 2022-06-13 ENCOUNTER — Ambulatory Visit: Payer: Medicare HMO | Admitting: Cardiovascular Disease
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
## 2022-06-19 ENCOUNTER — Encounter: Payer: Self-pay | Admitting: Medical

## 2022-06-19 ENCOUNTER — Other Ambulatory Visit
Admission: RE | Admit: 2022-06-19 | Discharge: 2022-06-19 | Disposition: A | Discharge status: Home / Self Care | Payer: Medicare HMO | Attending: Medical | Admitting: Medical

## 2022-06-19 ENCOUNTER — Ambulatory Visit: Payer: Medicare HMO | Admitting: Medical

## 2022-06-19 VITALS — BP 148/60 | HR 69 | Ht 59.0 in | Wt 155.4 lb

## 2022-06-19 DIAGNOSIS — I10 Essential (primary) hypertension: Secondary | ICD-10-CM

## 2022-06-19 DIAGNOSIS — I779 Disorder of arteries and arterioles, unspecified: Secondary | ICD-10-CM

## 2022-06-19 DIAGNOSIS — I25118 Atherosclerotic heart disease of native coronary artery with other forms of angina pectoris: Secondary | ICD-10-CM | POA: Diagnosis not present

## 2022-06-19 DIAGNOSIS — R0609 Other forms of dyspnea: Secondary | ICD-10-CM

## 2022-06-19 DIAGNOSIS — E782 Mixed hyperlipidemia: Secondary | ICD-10-CM | POA: Diagnosis not present

## 2022-06-19 LAB — CBC
HCT: 24.1 % — ABNORMAL LOW (ref 36.0–46.0)
Hemoglobin: 6.2 g/dL — ABNORMAL LOW (ref 12.0–15.0)
MCH: 17.3 pg — ABNORMAL LOW (ref 26.0–34.0)
MCHC: 25.7 g/dL — ABNORMAL LOW (ref 30.0–36.0)
MCV: 67.1 fL — ABNORMAL LOW (ref 80.0–100.0)
Platelets: 341 10*3/uL (ref 150–400)
RBC: 3.59 MIL/uL — ABNORMAL LOW (ref 3.87–5.11)
RDW: 21 % — ABNORMAL HIGH (ref 11.5–15.5)
WBC: 6 10*3/uL (ref 4.0–10.5)
nRBC: 0 % (ref 0.0–0.2)

## 2022-06-19 LAB — MAGNESIUM: Magnesium: 2.4 mg/dL (ref 1.7–2.4)

## 2022-06-19 LAB — TSH: TSH: 1.06 u[IU]/mL (ref 0.350–4.500)

## 2022-06-19 NOTE — Patient Instructions (Signed)
Medication Instructions:   Your physician recommends that you continue on your current medications as directed. Please refer to the Current Medication list given to you today.   *If you need a refill on your cardiac medications before your next appointment, please call your pharmacy*   Lab Work:  CBC, Magnesium, TSH -  Please go to the [place] [facility] [place] in at the Registration Desk: 1st desk to the right, past the screening table   If you have labs (blood work) drawn today and your tests are completely normal, you will receive your results only by: MyChart Message (if you have MyChart) OR A paper copy in the mail If you have any lab test that is abnormal or we need to change your treatment, we will call you to review the results.   Testing/Procedures:  1)Your physician has requested that you have an echocardiogram. Echocardiography is a painless test that uses sound waves to create images of your heart. It provides your doctor with information about the size and shape of your heart and how well your heart's chambers and valves are working. This procedure takes approximately one hour. There are no restrictions for this procedure.  2)Your physician has requested that you have a carotid duplex. This test is an ultrasound of the carotid arteries in your neck. It looks at blood flow through these arteries that supply the brain with blood. Allow one hour for this exam. There are no restrictions or special instructions.  3)[facility] MYOVIEW Your caregiver has ordered a Stress Test with nuclear imaging.(Lexiscan)The purpose of this test is to evaluate the blood supply to your heart muscle. This procedure is referred to as a "Non-Invasive Stress Test." This is because other than having an IV started in your vein, nothing is inserted or "invades" your body. Cardiac stress tests are done to find areas of poor blood flow to the heart by determining the extent of coronary artery disease  (CAD). Some patients exercise on a treadmill, which naturally increases the blood flow to your heart, while others who are  unable to walk on a treadmill due to physical limitations have a pharmacologic/chemical stress agent called Lexiscan . This medicine will mimic walking on a treadmill by temporarily increasing your coronary blood flow.   Please note: these test may take anywhere between 2-4 hours to complete  PLEASE REPORT TO [place] AT THE FIRST DESK WILL DIRECT YOU WHERE TO GO  Date of Procedure:_____________________________________  Arrival Time for Procedure:______________________________  Instructions regarding medication:   __X__ : Hold diabetes medication morning of procedure/ METFORMIN   PLEASE NOTIFY THE OFFICE AT LEAST 24 HOURS IN ADVANCE IF YOU ARE UNABLE TO KEEP YOUR APPOINTMENT.  [phone] AND  PLEASE NOTIFY NUCLEAR MEDICINE AT [facility] AT LEAST 24 HOURS IN ADVANCE IF YOU ARE UNABLE TO KEEP YOUR APPOINTMENT. [phone]  How to prepare for your Myoview test:  Do not eat or drink after midnight No caffeine for 24 hours prior to test No smoking 24 hours prior to test. Your medication may be taken with water.  If your doctor stopped a medication because of this test, do not take that medication. Ladies, please do not wear dresses.  Skirts or pants are appropriate. Please wear a short sleeve shirt. No perfume, cologne or lotion.   Follow-Up: At [facility], you and your health needs are our priority.  As part of our continuing mission to provide you with exceptional heart care,  we have created designated Provider Care Teams.  These Care Teams include your primary Cardiologist (physician) and Advanced Practice Providers (APPs -  Physician Assistants and Nurse Practitioners) who all work together to provide you with the care you need, when you need it.  We recommend signing up for the patient portal called "MyChart".  Sign up  information is provided on this After Visit Summary.  MyChart is used to connect with patients for Virtual Visits (Telemedicine).  Patients are able to view lab/test results, encounter notes, upcoming appointments, etc.  Non-urgent messages can be sent to your provider as well.   To learn more about what you can do with MyChart, go to [email or web address].    Your next appointment:   4-6 week(s)  The format for your next appointment:   In Person  Provider:   You may see Kathlyn Sacramento, MD or one of the following Advanced Practice Providers on your designated Care Team:   Murray Hodgkins, NP Christell Faith, PA-C Cadence Kathlen Mody, [place]    Other Instructions N/A  Important Information About Sugar

## 2022-06-20 ENCOUNTER — Emergency Department: Payer: Medicare HMO

## 2022-06-20 ENCOUNTER — Encounter: Payer: Self-pay | Admitting: *Deleted

## 2022-06-20 ENCOUNTER — Emergency Department
Admission: EM | Admit: 2022-06-20 | Discharge: 2022-06-21 | Disposition: A | Discharge status: Home / Self Care | Payer: Medicare HMO | Attending: Student in an Organized Health Care Education/Training Program | Admitting: Student in an Organized Health Care Education/Training Program

## 2022-06-20 ENCOUNTER — Other Ambulatory Visit: Payer: Self-pay

## 2022-06-20 DIAGNOSIS — D649 Anemia, unspecified: Secondary | ICD-10-CM | POA: Diagnosis not present

## 2022-06-20 DIAGNOSIS — R791 Abnormal coagulation profile: Secondary | ICD-10-CM | POA: Insufficient documentation

## 2022-06-20 DIAGNOSIS — R0602 Shortness of breath: Secondary | ICD-10-CM | POA: Diagnosis not present

## 2022-06-20 DIAGNOSIS — R531 Weakness: Secondary | ICD-10-CM | POA: Diagnosis present

## 2022-06-20 DIAGNOSIS — J9811 Atelectasis: Secondary | ICD-10-CM | POA: Diagnosis not present

## 2022-06-20 LAB — COMPREHENSIVE METABOLIC PANEL
ALT: 10 U/L (ref 0–44)
AST: 18 U/L (ref 15–41)
Albumin: 3.8 g/dL (ref 3.5–5.0)
Alkaline Phosphatase: 60 U/L (ref 38–126)
Anion gap: 7 (ref 5–15)
BUN: 19 mg/dL (ref 8–23)
CO2: 24 mmol/L (ref 22–32)
Calcium: 8.7 mg/dL — ABNORMAL LOW (ref 8.9–10.3)
Chloride: 111 mmol/L (ref 98–111)
Creatinine, Ser: 1.14 mg/dL — ABNORMAL HIGH (ref 0.44–1.00)
GFR, Estimated: 52 mL/min — ABNORMAL LOW (ref >60)
Glucose, Bld: 113 mg/dL — ABNORMAL HIGH (ref 70–99)
Potassium: 4.3 mmol/L (ref 3.5–5.1)
Sodium: 142 mmol/L (ref 135–145)
Total Bilirubin: 0.9 mg/dL (ref 0.3–1.2)
Total Protein: 8 g/dL (ref 6.5–8.1)

## 2022-06-20 LAB — TROPONIN I (HIGH SENSITIVITY): Troponin I (High Sensitivity): 5 ng/L (ref <18)

## 2022-06-20 LAB — D-DIMER, QUANTITATIVE: D-Dimer, Quant: 0.63 ug/mL-FEU — ABNORMAL HIGH (ref 0.00–0.50)

## 2022-06-20 LAB — CBC
HCT: 22.1 % — ABNORMAL LOW (ref 36.0–46.0)
Hemoglobin: 5.7 g/dL — ABNORMAL LOW (ref 12.0–15.0)
MCH: 17.6 pg — ABNORMAL LOW (ref 26.0–34.0)
MCHC: 25.8 g/dL — ABNORMAL LOW (ref 30.0–36.0)
MCV: 68.4 fL — ABNORMAL LOW (ref 80.0–100.0)
Platelets: 310 10*3/uL (ref 150–400)
RBC: 3.23 MIL/uL — ABNORMAL LOW (ref 3.87–5.11)
RDW: 20.9 % — ABNORMAL HIGH (ref 11.5–15.5)
WBC: 6.4 10*3/uL (ref 4.0–10.5)
nRBC: 0 % (ref 0.0–0.2)

## 2022-06-20 LAB — RETICULOCYTES
Immature Retic Fract: 30 % — ABNORMAL HIGH (ref 2.3–15.9)
RBC.: 3.29 MIL/uL — ABNORMAL LOW (ref 3.87–5.11)
Retic Count, Absolute: 67.8 10*3/uL (ref 19.0–186.0)
Retic Ct Pct: 2.1 % (ref 0.4–3.1)

## 2022-06-20 LAB — FERRITIN: Ferritin: 4 ng/mL — ABNORMAL LOW (ref 11–307)

## 2022-06-20 LAB — SAMPLE TO BLOOD BANK

## 2022-06-20 LAB — IRON AND TIBC
Iron: 16 ug/dL — ABNORMAL LOW (ref 28–170)
Saturation Ratios: 3 % — ABNORMAL LOW (ref 10.4–31.8)
TIBC: 547 ug/dL — ABNORMAL HIGH (ref 250–450)
UIBC: 531 ug/dL

## 2022-06-20 LAB — ABO/RH: ABO/RH(D): AB POS

## 2022-06-20 LAB — PREPARE RBC (CROSSMATCH)

## 2022-06-20 LAB — FOLATE: Folate: 11.2 ng/mL (ref >5.9)

## 2022-06-20 LAB — PROTIME-INR
INR: 1 (ref 0.8–1.2)
Prothrombin Time: 13.3 seconds (ref 11.4–15.2)

## 2022-06-20 MED ORDER — IRON 142 (45 FE) MG PO TBCR: 1.0000 | EXTENDED_RELEASE_TABLET | Freq: Every day | ORAL | 1 refills | Status: AC

## 2022-06-20 MED ORDER — SODIUM CHLORIDE 0.9 % IV SOLN
10.0000 mL/h | Freq: Once | INTRAVENOUS | Status: AC
  Administered 2022-06-20: 10 mL/h via INTRAVENOUS

## 2022-06-20 NOTE — ED Triage Notes (Signed)
Pt to triage via wheelchair.  Pt reports weakness.  Pt sent to er for eval of abnormal labs.  Pt reports low hgb.  Pt alert  speech clear.  Pt reports sob.  No dizziness no chest pain

## 2022-06-20 NOTE — ED Notes (Signed)
Nsr on monitor.  Iv in place   pt alert.

## 2022-06-20 NOTE — ED Notes (Signed)
Pt reports feeling weak and sob.  Sx for 1-2 months.  No chest pain.  Pt denies blood in stools.  No emesis.  Pt alert  speech clear.  Pt sitting up on stretcher.

## 2022-06-20 NOTE — ED Provider Triage Note (Signed)
Emergency Medicine Provider Triage Evaluation Note  Suzanne Cole , a 71 y.o. female  was evaluated in triage.  Pt complains of low hemoglobin.  She was sent by outpatient facility for lab work that was drawn yesterday.  Hemoglobin 6.2 yesterday.  Patient states she has been complaining feeling short of breath with exertion.  No chest pain, dizziness or lightheadedness.  Review of Systems  Positive: Shortness of breath Negative: Fevers, chest pain  Physical Exam  BP (!) 162/68 (BP Location: Left Arm)   Pulse 72   Temp 98.3 F (36.8 C) (Oral)   Resp 18   Ht '4\' 11"'$  (1.499 m)   Wt 70 kg   SpO2 98%   BMI 31.17 kg/m  Gen:   Awake, no distress   Resp:  Normal effort  MSK:   Moves extremities without difficulty  Other:    Medical Decision Making  Medically screening exam initiated at 4:55 PM.  Appropriate orders placed.  Twilla A Nguyen was informed that the remainder of the evaluation will be completed by another provider, this initial triage assessment does not replace that evaluation, and the importance of remaining in the ED until their evaluation is complete.     [name], [place] 06/20/22 1657

## 2022-06-20 NOTE — ED Notes (Signed)
Pt signed consent for blood.

## 2022-06-20 NOTE — ED Provider Notes (Signed)
[facility] Provider Note    Event Date/Time   First MD Initiated Contact with Patient 06/20/22 2132     (approximate)   History   Weakness   HPI  Suzanne Cole is a 71 y.o. female presents to the ER for outpatient abnormal labs with evidence of acute anemia.  Patient denies any melena no hematochezia no recent surgeries.  She is not on any anticoagulation.  Denies any known history of anemia does not take an iron supplement.  States that the symptoms have been progressively worsening over the past several days to weeks.  Her symptoms are primarily exertional dyspnea.  She denies any chest pain or pressure.  States that she does feel some mild fatigue.     Physical Exam   Triage Vital Signs: ED Triage Vitals  Enc Vitals Group     BP 06/20/22 1646 (!) 162/68     Pulse Rate 06/20/22 1646 72     Resp 06/20/22 1646 18     Temp 06/20/22 1646 98.3 F (36.8 C)     Temp Source 06/20/22 1646 Oral     SpO2 06/20/22 1646 98 %     Weight 06/20/22 1654 154 lb 5.2 oz (70 kg)     Height 06/20/22 1654 '4\' 11"'$  (1.499 m)     Head Circumference --      Peak Flow --      Pain Score 06/20/22 1654 0     Pain Loc --      Pain Edu? --      Excl. in [place]? --     Most recent vital signs: Vitals:   06/20/22 2313 06/20/22 2314  BP: (!) 174/67 (!) 174/67  Pulse: 69 69  Resp: 20 17  Temp: 98.2 F (36.8 C) 98.2 F (36.8 C)  SpO2: 97% 97%     Constitutional: Alert  Eyes: Conjunctivae are normal.  Head: Atraumatic. Nose: No congestion/rhinnorhea. Mouth/Throat: Mucous membranes are moist.   Neck: Painless ROM.  Cardiovascular:   Good peripheral circulation. Respiratory: Normal respiratory effort.  No retractions.  Gastrointestinal: Soft and nontender.  Musculoskeletal:  no deformity Neurologic:  MAE spontaneously. No gross focal neurologic deficits are appreciated.  Skin:  Skin is warm, dry and intact. No rash noted. Psychiatric: Mood and affect are normal.  Speech and behavior are normal.    ED Results / Procedures / Treatments   Labs (all labs ordered are listed, but only abnormal results are displayed) Labs Reviewed  D-DIMER, QUANTITATIVE - Abnormal; Notable for the following components:      Result Value   D-Dimer, Quant 0.63 (*)    All other components within normal limits  CBC - Abnormal; Notable for the following components:   RBC 3.23 (*)    Hemoglobin 5.7 (*)    HCT 22.1 (*)    MCV 68.4 (*)    MCH 17.6 (*)    MCHC 25.8 (*)    RDW 20.9 (*)    All other components within normal limits  COMPREHENSIVE METABOLIC PANEL - Abnormal; Notable for the following components:   Glucose, Bld 113 (*)    Creatinine, Ser 1.14 (*)    Calcium 8.7 (*)    GFR, Estimated 52 (*)    All other components within normal limits  RETICULOCYTES - Abnormal; Notable for the following components:   RBC. 3.29 (*)    Immature Retic Fract 30.0 (*)    All other components within normal limits  PROTIME-INR  VITAMIN  B12  FOLATE  IRON AND TIBC  FERRITIN  SAMPLE TO BLOOD BANK  PREPARE RBC (CROSSMATCH)  TYPE AND SCREEN  ABO/RH  TROPONIN I (HIGH SENSITIVITY)  TROPONIN I (HIGH SENSITIVITY)     EKG  ED ECG REPORT I, Merlyn Lot, the attending physician, personally viewed and interpreted this ECG.   Date: 06/20/2022  EKG Time: 17:10  Rate: 70  Rhythm: sinus  Axis: normal  Intervals:normal  ST&T Change: no stemi, no depressions     RADIOLOGY Please see ED Course for my review and interpretation.  I personally reviewed all radiographic images ordered to evaluate for the above acute complaints and reviewed radiology reports and findings.  These findings were personally discussed with the patient.  Please see medical record for radiology report.    PROCEDURES:  Critical Care performed: Yes, see critical care procedure note(s)  .Critical Care  Performed by: Merlyn Lot, MD Authorized by: Merlyn Lot, MD   Critical care  provider statement:    Critical care time (minutes):  30   Critical care was necessary to treat or prevent imminent or life-threatening deterioration of the following conditions:  Circulatory failure   Critical care was time spent personally by me on the following activities:  Ordering and performing treatments and interventions, ordering and review of laboratory studies, ordering and review of radiographic studies, pulse oximetry, re-evaluation of patient's condition, review of old charts, obtaining history from patient or surrogate, examination of patient, evaluation of patient's response to treatment, discussions with primary provider, discussions with consultants and development of treatment plan with patient or surrogate    MEDICATIONS ORDERED IN ED: Medications  0.9 %  sodium chloride infusion (has no administration in time range)     IMPRESSION / MDM / ASSESSMENT AND PLAN / ED COURSE  I reviewed the triage vital signs and the nursing notes.                              Differential diagnosis includes, but is not limited to, iron deficiency, GI bleed, renal insufficiency, anemia of chronic disease, CHF, retroperitoneal bleed, nosebleed  Patient presented to the ER for evaluation of symptomatic anemia.  Her exam is reassuring no source of active bleeding.  No hematoma.  Her abdominal exam soft and benign.  This presenting complaint could reflect a potentially life-threatening illness therefore the patient will be placed on continuous pulse oximetry and telemetry for monitoring.  Laboratory evaluation will be sent to evaluate for the above complaints.      Clinical Course as of 06/20/22 2315  Thu Jun 20, 2022  2200 Guaiac negative.  Digestive D-dimer is negative.  Is not consistent with PE.  Suspect iron deficiency anemia but given the level and her symptoms will order blood transfusion here in the ER. [PR]    Clinical Course User Index [PR] Merlyn Lot, MD   Remains  well-appearing in no acute distress.  Patient receiving IV transfusion.  Suspect this is iron deficiency anemia.  Patient be signed out to oncoming physician pending reassessment after transfusion.  Anticipate patient will be appropriate for outpatient follow-up.   FINAL CLINICAL IMPRESSION(S) / ED DIAGNOSES   Final diagnoses:  Anemia, unspecified type     Rx / DC Orders   ED Discharge Orders          Ordered    Ferrous Sulfate (IRON) 142 (45 Fe) MG TBCR  Daily  06/20/22 2312             Note:  This document was prepared using Dragon voice recognition software and may include unintentional dictation errors.    Merlyn Lot, MD 06/20/22 [phone]

## 2022-06-20 NOTE — ED Notes (Signed)
Chart reviewed; acuity level changed and charge nurse notified of exam room needed

## 2022-06-21 LAB — VITAMIN B12: Vitamin B-12: 93 pg/mL — ABNORMAL LOW (ref 180–914)

## 2022-06-21 NOTE — ED Provider Notes (Signed)
I assumed care of the patient approximate 2300.  Please see outgoing providers note for full details regarding patient's initial evaluation assessment.  In brief patient presents for evaluation of some anemia.  Cerner is for iron deficiency anemia.  Plan is to transfuse 2 units of PRBCs and discharge with continued outpatient evaluation and management.  On my reassessment patient is denying any acute complaints.  Will plan to discharge when she is complete her transfusion.  I will add iron supplementation.   Lucrezia Starch, MD 06/21/22 [phone]

## 2022-06-21 NOTE — ED Notes (Signed)
Patient blood transfusion finished at this time and flushed with 43m NS.

## 2022-06-22 LAB — TYPE AND SCREEN
ABO/RH(D): AB POS
Antibody Screen: NEGATIVE
Unit division: 0
Unit division: 0

## 2022-06-22 LAB — BPAM RBC
Blood Product Expiration Date: 202308192359
Blood Product Expiration Date: 202308312359
ISSUE DATE / TIME: 202308102323
ISSUE DATE / TIME: 202308110241
Unit Type and Rh: 600
Unit Type and Rh: 6200

## 2022-06-24 ENCOUNTER — Ambulatory Visit
Admission: RE | Admit: 2022-06-24 | Discharge: 2022-06-24 | Disposition: A | Discharge status: Home / Self Care | Payer: Medicare HMO | Source: Ambulatory Visit | Attending: Medical | Admitting: Medical

## 2022-06-24 ENCOUNTER — Telehealth: Payer: Self-pay

## 2022-06-24 DIAGNOSIS — R0609 Other forms of dyspnea: Secondary | ICD-10-CM | POA: Diagnosis not present

## 2022-06-24 MED ORDER — TECHNETIUM TC 99M TETROFOSMIN IV KIT
10.0000 | PACK | Freq: Once | INTRAVENOUS | Status: AC
  Administered 2022-06-24: 10.25 via INTRAVENOUS

## 2022-06-24 MED ORDER — REGADENOSON 0.4 MG/5ML IV SOLN
0.4000 mg | Freq: Once | INTRAVENOUS | Status: AC
  Administered 2022-06-24: 0.4 mg via INTRAVENOUS
  Filled 2022-06-24: qty 5

## 2022-06-24 MED ORDER — TECHNETIUM TC 99M TETROFOSMIN IV KIT
30.0000 | PACK | Freq: Once | INTRAVENOUS | Status: AC
  Administered 2022-06-24: 31.58 via INTRAVENOUS

## 2022-06-24 NOTE — Telephone Encounter (Signed)
I called patient to schedule hospital follow-up & she was schedule for 3 month f/u 8/24 so I changed appointment to hos. f/u instead.

## 2022-06-25 LAB — NM MYOCAR MULTI W/SPECT W/WALL MOTION / EF
Base ST Depression (mm): 0 mm
LV dias vol: 61 mL (ref 46–106)
LV sys vol: 35 mL
Nuc Stress EF: 43 %
Peak HR: 83 {beats}/min
Percent HR: 55 %
Rest HR: 62 {beats}/min
Rest Nuclear Isotope Dose: 10.3 mCi
SDS: 1
SRS: 2
SSS: 1
ST Depression (mm): 0 mm
Stress Nuclear Isotope Dose: 31.6 mCi
TID: 0.91

## 2022-07-01 ENCOUNTER — Telehealth: Payer: Self-pay

## 2022-07-01 NOTE — Telephone Encounter (Signed)
-----   Message from [place], PA-C sent at 07/01/2022  9:05 AM EDT ----- Stress test overall looked good with no significant blockages, overall low risk.  Can we also make sure she has follow-up with PCP for acute anemia? And can we ask her if she continued taking Aspirin and Plavix? If yes, stop Plavix.

## 2022-07-01 NOTE — Telephone Encounter (Signed)
Called to give the pt stress test results. Pt phone rings out. No answer no voicemail.

## 2022-07-02 ENCOUNTER — Other Ambulatory Visit: Payer: Self-pay

## 2022-07-02 MED ORDER — ROSUVASTATIN CALCIUM 40 MG PO TABS: ORAL_TABLET | ORAL | 3 refills | Status: DC

## 2022-07-02 NOTE — Telephone Encounter (Signed)
Patient made aware of stress test results with verbalized understanding. Patient is scheduled to see her pcp Dr. Derrel Nip on 07/04/22.  Patient sts that she is currently out and not completely sure if she is still taking plavix (clopidogrel). Adv the pt that plavix is to be d/c and she should continue asa 81 mg daily. Pt verbalized understanding and sts that she will update her meds when she returns home.

## 2022-07-02 NOTE — Telephone Encounter (Signed)
-----   Message from [place], PA-C sent at 07/01/2022  9:05 AM EDT ----- Stress test overall looked good with no significant blockages, overall low risk.  Can we also make sure she has follow-up with PCP for acute anemia? And can we ask her if she continued taking Aspirin and Plavix? If yes, stop Plavix.

## 2022-07-04 ENCOUNTER — Ambulatory Visit (INDEPENDENT_AMBULATORY_CARE_PROVIDER_SITE_OTHER): Payer: Medicare HMO | Admitting: Internal Medicine

## 2022-07-04 ENCOUNTER — Encounter: Payer: Self-pay | Admitting: Internal Medicine

## 2022-07-04 VITALS — BP 140/70 | HR 68 | Temp 97.9°F | Ht 59.0 in | Wt 156.2 lb

## 2022-07-04 DIAGNOSIS — E1169 Type 2 diabetes mellitus with other specified complication: Secondary | ICD-10-CM

## 2022-07-04 DIAGNOSIS — E669 Obesity, unspecified: Secondary | ICD-10-CM

## 2022-07-04 DIAGNOSIS — E1159 Type 2 diabetes mellitus with other circulatory complications: Secondary | ICD-10-CM

## 2022-07-04 DIAGNOSIS — D519 Vitamin B12 deficiency anemia, unspecified: Secondary | ICD-10-CM | POA: Diagnosis not present

## 2022-07-04 DIAGNOSIS — D500 Iron deficiency anemia secondary to blood loss (chronic): Secondary | ICD-10-CM

## 2022-07-04 DIAGNOSIS — I152 Hypertension secondary to endocrine disorders: Secondary | ICD-10-CM | POA: Diagnosis not present

## 2022-07-04 DIAGNOSIS — D649 Anemia, unspecified: Secondary | ICD-10-CM | POA: Diagnosis not present

## 2022-07-04 DIAGNOSIS — I10 Essential (primary) hypertension: Secondary | ICD-10-CM | POA: Diagnosis not present

## 2022-07-04 DIAGNOSIS — M5416 Radiculopathy, lumbar region: Secondary | ICD-10-CM

## 2022-07-04 DIAGNOSIS — E538 Deficiency of other specified B group vitamins: Secondary | ICD-10-CM

## 2022-07-04 LAB — CBC WITH DIFFERENTIAL/PLATELET
Basophils Absolute: 0.1 10*3/uL (ref 0.0–0.1)
Basophils Relative: 1.4 % (ref 0.0–3.0)
Eosinophils Absolute: 0.2 10*3/uL (ref 0.0–0.7)
Eosinophils Relative: 4 % (ref 0.0–5.0)
HCT: 31.2 % — ABNORMAL LOW (ref 36.0–46.0)
Hemoglobin: 9.5 g/dL — ABNORMAL LOW (ref 12.0–15.0)
Lymphocytes Relative: 19.4 % (ref 12.0–46.0)
Lymphs Abs: 1.2 10*3/uL (ref 0.7–4.0)
MCHC: 30.4 g/dL (ref 30.0–36.0)
MCV: 74.5 fl — ABNORMAL LOW (ref 78.0–100.0)
Monocytes Absolute: 0.8 10*3/uL (ref 0.1–1.0)
Monocytes Relative: 12.5 % — ABNORMAL HIGH (ref 3.0–12.0)
Neutro Abs: 3.8 10*3/uL (ref 1.4–7.7)
Neutrophils Relative %: 62.7 % (ref 43.0–77.0)
Platelets: 336 10*3/uL (ref 150.0–400.0)
RBC: 4.18 Mil/uL (ref 3.87–5.11)
RDW: 32.3 % — ABNORMAL HIGH (ref 11.5–15.5)
WBC: 6.1 10*3/uL (ref 4.0–10.5)

## 2022-07-04 LAB — COMPREHENSIVE METABOLIC PANEL
ALT: 6 U/L (ref 0–35)
AST: 12 U/L (ref 0–37)
Albumin: 4.2 g/dL (ref 3.5–5.2)
Alkaline Phosphatase: 59 U/L (ref 39–117)
BUN: 17 mg/dL (ref 6–23)
CO2: 30 mEq/L (ref 19–32)
Calcium: 8.9 mg/dL (ref 8.4–10.5)
Chloride: 105 mEq/L (ref 96–112)
Creatinine, Ser: 1.03 mg/dL (ref 0.40–1.20)
GFR: 54.95 mL/min — ABNORMAL LOW (ref >60.00)
Glucose, Bld: 102 mg/dL — ABNORMAL HIGH (ref 70–99)
Potassium: 4.6 mEq/L (ref 3.5–5.1)
Sodium: 142 mEq/L (ref 135–145)
Total Bilirubin: 0.7 mg/dL (ref 0.2–1.2)
Total Protein: 7.6 g/dL (ref 6.0–8.3)

## 2022-07-04 LAB — LDL CHOLESTEROL, DIRECT: Direct LDL: 37 mg/dL

## 2022-07-04 LAB — LIPID PANEL
Cholesterol: 114 mg/dL (ref 0–200)
HDL: 49.4 mg/dL (ref >39.00)
LDL Cholesterol: 31 mg/dL (ref 0–99)
NonHDL: 64.6
Total CHOL/HDL Ratio: 2
Triglycerides: 169 mg/dL — ABNORMAL HIGH (ref 0.0–149.0)
VLDL: 33.8 mg/dL (ref 0.0–40.0)

## 2022-07-04 LAB — HEMOGLOBIN A1C: Hgb A1c MFr Bld: 5.6 % (ref 4.6–6.5)

## 2022-07-04 MED ORDER — CYANOCOBALAMIN 1000 MCG/ML IJ SOLN
1000.0000 ug | Freq: Once | INTRAMUSCULAR | Status: AC
  Administered 2022-07-04: 1000 ug via INTRAMUSCULAR

## 2022-07-04 NOTE — Patient Instructions (Signed)
Anemia can be caused by many things:  You have a b12 and iron deficiency.  Please resume your B12 supplements until we determine if injections need to be continued for life.   Other causes are potentially present as well.  Additional tests are being run.   The referral to hematologist (blood specialist) Dr Talbert Cage is in progress

## 2022-07-04 NOTE — Progress Notes (Signed)
Subjective:  Patient ID: Suzanne Cole, female    DOB: 07-03-1951  Age: 71 y.o. MRN: [id number]  CC: The primary encounter diagnosis was Obesity, diabetes, and hypertension syndrome ([place]). Diagnoses of Anemia due to vitamin B12 deficiency, unspecified B12 deficiency type, Iron deficiency anemia due to chronic blood loss, Anemia, unspecified type, B12 deficiency, Lumbar radiculitis, and Symptomatic anemia were also pertinent to this visit.   HPI Suzanne Cole presents for FOLLOW UP ON type 2 diabetes , ckd and hypertension  Chief Complaint  Patient presents with   Follow-up    3 month follow up on diabetes, pain management   1) Symptomatic anemia:  presented to cardiology on August 9 with worsening exertional dyspnea that she first noted in July.  She was sent to ED for transfusion when her screening CBC resulted with a hgb of  5.7 .  Stool was guaiac negative per patient (not in chart).   B12 was 93 and ferritin was 3.  Sent home without b12 supplementation , but with rx for iron supplement and directions to follow up with hematologist Dr. Talbert Cage.  She has been tolerating an oral   iron supplement  with breakfast .  She has contacted  hematology but  still   waiting for an appointment to be set up.  Med changes:   plavix was stopped . (Started 7 years ago for RCA stent ).  She feels significantly better since receiving the transfusion and denies Suzanne recurrent chest pain. Her last colonoscopy was reviewed (2012);  diverticulosis without bleeding.  No prior EGD   2) CAD:  during the workup for chest pain she underwent noninvasive testing with a myoview which was interpreted as  low risk.  ECHO and carotid dopplers are scheduled for sept 12,  cardiology follow up sept 13   3) chronic back pain managed with hydrocodone.  She is limited to walking short distances and stopping frequently while grocery shopping due to recurrence of pain . She has not had Suzanne ER visits  And has not requested Suzanne early  refills.  Her Refill history was confirmed via [place] Controlled Substance database by me today during her visit and there have been no prescriptions of controlled substances filled from Suzanne providers other than me. .       Outpatient Medications Prior to Visit  Medication Sig Dispense Refill   albuterol (PROVENTIL) (2.5 MG/3ML) 0.083% nebulizer solution Take 3 mLs (2.5 mg total) by nebulization every 6 (six) hours as needed for wheezing or shortness of breath. 150 mL 1   albuterol (VENTOLIN HFA) 108 (90 Base) MCG/ACT inhaler INHALE 2 PUFFS BY MOUTH EVERY 6 HOURS AS NEEDED FOR WHEEZING OR SHORTNESS OF BREATH 18 g 2   amLODipine (NORVASC) 10 MG tablet TAKE 1 TABLET(10 MG) BY MOUTH DAILY FOR HIGH BLOOD PRESSURE 90 tablet 1   aspirin 81 MG tablet Take 81 mg by mouth as needed for pain.     cetirizine (ZYRTEC) 10 MG tablet Take 10 mg by mouth daily.     Cholecalciferol (VITAMIN D3) 1000 units CHEW Chew 1 tablet by mouth daily.     Ferrous Sulfate (IRON) 142 (45 Fe) MG TBCR Take 1 tablet by mouth daily. 30 tablet 1   fluticasone (FLONASE) 50 MCG/ACT nasal spray Place 2 sprays into both nostrils daily. 16 g 6   gabapentin (NEURONTIN) 300 MG capsule TAKE 1 CAPSULE(300 MG) BY MOUTH THREE TIMES DAILY 270 capsule 1   HYDROcodone-acetaminophen (NORCO) 10-325 MG tablet  Take 1 tablet by mouth every 6 (six) hours as needed. 120 tablet 0   metFORMIN (GLUCOPHAGE) 500 MG tablet Take 1 tablet (500 mg total) by mouth 2 (two) times daily with a meal. 180 tablet 3   methocarbamol (ROBAXIN) 750 MG tablet Take 1 tablet (750 mg total) by mouth 4 (four) times daily. 360 tablet 1   pantoprazole (PROTONIX) 40 MG tablet TAKE 1 TABLET(40 MG) BY MOUTH DAILY 90 tablet 3   rosuvastatin (CRESTOR) 40 MG tablet TAKE 1 TABLET(40 MG) BY MOUTH DAILY 90 tablet 3   carvedilol (COREG) 25 MG tablet Take 1 tablet (25 mg total) by mouth 2 (two) times daily. 180 tablet 3   No facility-administered medications prior to visit.    Review of  Systems;  Patient denies headache, fevers, malaise, unintentional weight loss, skin rash, eye pain, sinus congestion and sinus pain, sore throat, dysphagia,  hemoptysis , cough, dyspnea, wheezing, chest pain, palpitations, orthopnea, edema, abdominal pain, nausea, melena, diarrhea, constipation, flank pain, dysuria, hematuria, urinary  Frequency, nocturia, numbness, tingling, seizures,  Focal weakness, Loss of consciousness,  Tremor, insomnia, depression, anxiety, and suicidal ideation.      Objective:  BP (!) 140/70 (BP Location: Left Arm, Patient Position: Sitting, Cuff Size: Large)   Pulse 68   Temp 97.9 F (36.6 C) (Oral)   Ht _0  (1.499 m)   Wt 156 lb 3.2 oz (70.9 kg)   SpO2 96%   BMI 31.55 kg/m   BP Readings from Last 3 Encounters:  07/04/22 (!) 140/70  06/21/22 (!) 191/75  06/19/22 (!) 148/60    Wt Readings from Last 3 Encounters:  07/04/22 156 lb 3.2 oz (70.9 kg)  06/20/22 154 lb 5.2 oz (70 kg)  06/19/22 155 lb 6 oz (70.5 kg)    General appearance: alert, cooperative and appears stated age Ears: normal TM's and external ear canals both ears Throat: lips, mucosa, and tongue normal; teeth and gums normal Neck: no adenopathy, no carotid bruit, supple, symmetrical, trachea midline and thyroid not enlarged, symmetric, no tenderness/mass/nodules Back: symmetric, no curvature. ROM normal. No CVA tenderness. Lungs: clear to auscultation bilaterally Heart: regular rate and rhythm, S1, S2 normal, no murmur, click, rub or gallop Abdomen: soft, non-tender; bowel sounds normal; no masses,  no organomegaly Pulses: 2+ and symmetric Skin: Skin color, texture, turgor normal. No rashes or lesions Lymph nodes: Cervical, supraclavicular, and axillary nodes normal.  Lab Results  Component Value Date   HGBA1C 6.0 (A) 04/03/2022   HGBA1C 6.4 01/01/2022   HGBA1C 6.2 07/13/2021    Lab Results  Component Value Date   CREATININE 1.14 (H) 06/20/2022   CREATININE 1.03 04/02/2022    CREATININE 1.15 01/01/2022    Lab Results  Component Value Date   WBC 6.4 06/20/2022   HGB 5.7 (L) 06/20/2022   HCT 22.1 (L) 06/20/2022   PLT 310 06/20/2022   GLUCOSE 113 (H) 06/20/2022   CHOL 110 04/02/2022   TRIG 178.0 (H) 04/02/2022   HDL 46.30 04/02/2022   LDLDIRECT 63.0 01/01/2022   LDLCALC 28 04/02/2022   ALT 10 06/20/2022   AST 18 06/20/2022   NA 142 06/20/2022   K 4.3 06/20/2022   CL 111 06/20/2022   CREATININE 1.14 (H) 06/20/2022   BUN 19 06/20/2022   CO2 24 06/20/2022   TSH 1.060 06/19/2022   INR 1.0 06/20/2022   HGBA1C 6.0 (A) 04/03/2022   MICROALBUR 1.0 07/13/2021    NM Myocar Multi W/Spect W/Wall Motion / EF  Result  Date: 06/25/2022 Challenging study Pharmacological myocardial perfusion imaging study with no significant  ischemia Small region fixed defect mid to distal anteroseptal wall noted, unable to exclude breast attenuation artifact Unable to visualize wall motion, ejection fraction not calculated secondary to significant GI uptake artifact No EKG changes concerning for ischemia at peak stress or in recovery. Likely low risk scan, consider alternate imaging study if clinically indicated Signed, Esmond Plants, MD, Ph.D [facility]    Assessment & Plan:   Problem List Items Addressed This Visit     B12 deficiency    Not addressed by ER physician.  IM supplementation started today; IF antibody needed       Lumbar radiculitis    Secondary to DDD and scoliosis managed with vicodin and methocarbamol.  NSAIDS  C/i due to CKD   .  She has not had Suzanne ER visits  And has not requested Suzanne early refills.  Her Refill history was confirmed via [place] Controlled Substance database by me today during her visit and there have been no prescriptions of controlled substances filled from Suzanne providers other than me. .Her pain is better controlled on the current regimen and she is staying as  Active as her pain which now.  Vicodin will be refilled for September, October and  November       Obesity, diabetes, and hypertension syndrome ([place]) - Primary   Relevant Orders   HgB A1c   Comp Met (CMET)   Lipid Profile   Direct LDL   Urine Microalbumin w/creat. ratio (Completed)   Symptomatic anemia    With b12 and iron deficiencies noted. The source of her iron deficiency is unclear and she is awaiting hematology evaluation. Additional tests ordered today to rule out MM. Are still pending      Other Visit Diagnoses     Anemia due to vitamin B12 deficiency, unspecified B12 deficiency type       Relevant Medications   cyanocobalamin (VITAMIN B12) injection 1,000 mcg (Completed)   Other Relevant Orders   Ambulatory referral to Hematology / Oncology   CBC with Differential/Platelet   Intrinsic Factor Antibodies   Iron deficiency anemia due to chronic blood loss       Relevant Medications   cyanocobalamin (VITAMIN B12) injection 1,000 mcg (Completed)   Other Relevant Orders   Ambulatory referral to Hematology / Oncology   CBC with Differential/Platelet   Fecal occult blood, imunochemical   Anemia, unspecified type       Relevant Medications   cyanocobalamin (VITAMIN B12) injection 1,000 mcg (Completed)   Other Relevant Orders   Protein electrophoresis, serum   IFE AND PE, RANDOM URINE (Completed)       I spent a total of 30  minutes with this patient in a face to face visit on the date of this encounter reviewing the last office visit with me in May, most recent visit with patient's cardiologist ,  her recent ER visit , recent labs,   and post visit ordering of testing and therapeutics.    Follow-up: Return in about 3 months (around 10/04/2022) for follow up diabetes.   [name], MD

## 2022-07-05 LAB — IFE AND PE, RANDOM URINE

## 2022-07-05 LAB — MICROALBUMIN / CREATININE URINE RATIO
Creatinine, Urine: 150.9 mg/dL
Microalb/Creat Ratio: 13 mg/g creat (ref 0–29)
Microalbumin, Urine: 20.3 ug/mL

## 2022-07-06 DIAGNOSIS — D649 Anemia, unspecified: Secondary | ICD-10-CM | POA: Insufficient documentation

## 2022-07-06 DIAGNOSIS — E538 Deficiency of other specified B group vitamins: Secondary | ICD-10-CM | POA: Insufficient documentation

## 2022-07-06 DIAGNOSIS — Z862 Personal history of diseases of the blood and blood-forming organs and certain disorders involving the immune mechanism: Secondary | ICD-10-CM | POA: Insufficient documentation

## 2022-07-06 NOTE — Assessment & Plan Note (Signed)
With b12 and iron deficiencies noted. The source of her iron deficiency is unclear and she is awaiting hematology evaluation. Additional tests ordered today to rule out MM. Are still pending

## 2022-07-06 NOTE — Assessment & Plan Note (Signed)
Secondary to DDD and scoliosis managed with vicodin and methocarbamol.  NSAIDS  C/i due to CKD   .  She has not had any ER visits  And has not requested any early refills.  Her Refill history was confirmed via [place] Controlled Substance database by me today during her visit and there have been no prescriptions of controlled substances filled from any providers other than me. .Her pain is better controlled on the current regimen and she is staying as  Active as her pain which now.  Vicodin will be refilled for September, October and November  

## 2022-07-06 NOTE — Assessment & Plan Note (Signed)
Not addressed by ER physician.  IM supplementation started today; IF antibody needed

## 2022-07-09 ENCOUNTER — Encounter: Payer: Self-pay | Admitting: Family

## 2022-07-09 ENCOUNTER — Ambulatory Visit (INDEPENDENT_AMBULATORY_CARE_PROVIDER_SITE_OTHER): Payer: Medicare HMO | Admitting: Family

## 2022-07-09 VITALS — BP 136/70 | HR 65 | Temp 98.2°F | Ht 59.0 in | Wt 154.2 lb

## 2022-07-09 DIAGNOSIS — B309 Viral conjunctivitis, unspecified: Secondary | ICD-10-CM | POA: Diagnosis not present

## 2022-07-09 DIAGNOSIS — H109 Unspecified conjunctivitis: Secondary | ICD-10-CM | POA: Insufficient documentation

## 2022-07-09 LAB — PROTEIN ELECTROPHORESIS, SERUM
Albumin ELP: 3.9 g/dL (ref 3.8–4.8)
Alpha 1: 0.3 g/dL (ref 0.2–0.3)
Alpha 2: 0.8 g/dL (ref 0.5–0.9)
Beta 2: 0.6 g/dL — ABNORMAL HIGH (ref 0.2–0.5)
Beta Globulin: 0.6 g/dL (ref 0.4–0.6)
Gamma Globulin: 1.3 g/dL (ref 0.8–1.7)
Total Protein: 7.3 g/dL (ref 6.1–8.1)

## 2022-07-09 LAB — INTRINSIC FACTOR ANTIBODIES: Intrinsic Factor: POSITIVE — AB

## 2022-07-09 MED ORDER — OLOPATADINE HCL 0.1 % OP SOLN: 1.0000 [drp] | Freq: Two times a day (BID) | OPHTHALMIC | 1 refills | Status: DC

## 2022-07-09 NOTE — Patient Instructions (Signed)
Start antihistamine eyedrops, Pataday.  Please let me know symptoms do not completely resolve  Allergic Conjunctivitis, Adult Allergic conjunctivitis is inflammation of the conjunctiva. The conjunctiva is the thin, clear membrane that covers the white part of the eye and the inner surface of the eyelid. Allergies can affect this layer of the eye. In this condition: The blood vessels in the conjunctiva swell and become irritated. The eyes become red or pink and feel itchy. There is often a watery discharge from the eyes. Allergic conjunctivitis is not contagious. This means it cannot be spread from person to person. The condition can develop at any age and may be outgrown. What are the causes? This condition is caused by allergens. These are things that can cause an allergic reaction in some people. Common allergens include: Outdoor allergens, such as: Pollen, including pollen from grass and weeds. Mold spores. Car fumes. Pollution. Indoor allergens, such as: Dust. Smoke. Mold spores. Proteins in a pet's urine, saliva, or dander. Protein buildup on contact lenses. What increases the risk? You may be more likely to develop this condition if you have a family history of these things: Allergies. Conditions caused by being exposed to allergens, such as: Allergic rhinitis. This is an allergic reaction that affects the nose. Bronchial asthma. This condition affects the large airways in the lungs and makes breathing difficult. Atopic dermatitis (eczema). This is inflammation of the skin that is long-term (chronic). What are the signs or symptoms? Symptoms of this condition include eyes that are itchy, red, watery, or puffy. Your eyes may also: Sting or burn. Have clear fluid draining from them. Have thick mucus discharge and pain (vernal conjunctivitis). This happens in severe cases. How is this diagnosed? This condition may be diagnosed based on: Your medical history. A physical exam,  including an eye exam. Tests of the fluid draining from your eyes to rule out other causes. Other tests to confirm the diagnosis, including: Testing for allergies. The skin may be pricked with a tiny needle. The pricked area is then exposed to small amounts of allergens. Testing for other eye conditions. Tests may include: Blood tests. Tissue scrapings from your eyelid. The tissue is then checked under a microscope. How is this treated? Treatment for this condition may include: Using cold, wet cloths (cold compresses) to soothe itching and swelling. Washing your face and hair. Also, washing your clothes often to remove allergens. Using eye drops. These may be prescription or over-the-counter. You may need to try different types to see which one works best for you. Examples include: Eye drops that wash allergens out of the eyes (preservative-free artificial tears). Eye drops that block the allergic reaction (antihistamine). Eye drops that reduce swelling and irritation (anti-inflammatory). Steroid eye drops, which may be given if other treatments have not worked. Oral antihistamine medicines. These are medicines taken by mouth to lessen your allergic reaction. You may need these if eye drops do not help or are difficult to use. An air purifier at home and work. Wraparound sunglasses. This may help to decrease the amount of allergens reaching the eye. Not wearing contact lenses until symptoms improve, if the condition was caused by contact lenses. Change to daily wear disposable contact lenses, if possible. Follow these instructions at home: Eye care Apply a clean, cold compress to your eyes for 10-20 minutes, 3-4 times a day. Do not touch or rub your eyes. Do not wear contact lenses until the inflammation is gone. Wear glasses instead. Do not wear eye makeup  until the inflammation is gone. General instructions Avoid known allergens whenever possible. Take or apply over-the-counter and  prescription medicines only as told by your health care provider. These include any eye drops. Drink enough fluid to keep your urine pale yellow. Keep all follow-up visits. Contact a health care provider if: Your symptoms get worse or do not get better with treatment. You have mild eye pain. You become sensitive to light. You have spots or blisters on your eyes. You have a fever. Get help right away if: You have redness, swelling, or other symptoms in only one eye. Your vision is blurred or you have other vision changes. You have pus draining from your eyes. You have severe eye pain. Summary Allergic conjunctivitis is inflammation of the eye that is caused by allergens. It affects the clear membrane that covers the white part of the eye and the inner surface of the eyelid. It often causes eye itching, redness, and a watery discharge. Take or apply over-the-counter and prescription medicines only as told by your health care provider. These include eye drops. Do not touch or rub your eyes. Contact a health care provider if your symptoms get worse or do not get better with treatment. This information is not intended to replace advice given to you by your health care provider. Make sure you discuss any questions you have with your health care provider. Document Revised: 01/07/2022 Document Reviewed: 01/07/2022 Elsevier Patient Education  [place].
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
## 2022-07-09 NOTE — Assessment & Plan Note (Addendum)
Symptoms improving.  Patient is not a contact lens wearer.  Endorses clear discharge, gritty sensation.  Photophobia has resolved.  Discussed with patient most likely viral versus allergic in etiology.  Counseled her on alarm  features including eye pain, vision loss which would warrant immediate ophthalmology consult.  We agreed to start Pataday eyedrops.  She will let me know how she is doing

## 2022-07-09 NOTE — Progress Notes (Signed)
Right eye is red patient stated that symptoms started on Sat. Patient stated that she woke up with two red streaks in her eye. Eye was sore and sensitive to light for first 2 days

## 2022-07-11 ENCOUNTER — Ambulatory Visit (INDEPENDENT_AMBULATORY_CARE_PROVIDER_SITE_OTHER): Payer: Medicare HMO

## 2022-07-11 DIAGNOSIS — E538 Deficiency of other specified B group vitamins: Secondary | ICD-10-CM | POA: Diagnosis not present

## 2022-07-11 MED ORDER — CYANOCOBALAMIN 1000 MCG/ML IJ SOLN
1000.0000 ug | Freq: Once | INTRAMUSCULAR | Status: AC
  Administered 2022-07-11: 1000 ug via INTRAMUSCULAR

## 2022-07-11 NOTE — Progress Notes (Addendum)
Patient presented for B 12 injection to left deltoid, patient voiced no concerns nor showed any signs of distress during injection. 

## 2022-07-13 ENCOUNTER — Other Ambulatory Visit: Payer: Self-pay | Admitting: Internal Medicine

## 2022-07-14 MED ORDER — HYDROCODONE-ACETAMINOPHEN 10-325 MG PO TABS: 1.0000 | ORAL_TABLET | Freq: Four times a day (QID) | ORAL | 0 refills | Status: DC | PRN

## 2022-07-17 ENCOUNTER — Ambulatory Visit (INDEPENDENT_AMBULATORY_CARE_PROVIDER_SITE_OTHER): Payer: Medicare HMO

## 2022-07-17 DIAGNOSIS — E538 Deficiency of other specified B group vitamins: Secondary | ICD-10-CM

## 2022-07-17 MED ORDER — CYANOCOBALAMIN 1000 MCG/ML IJ SOLN
1000.0000 ug | Freq: Once | INTRAMUSCULAR | Status: AC
  Administered 2022-07-17: 1000 ug via INTRAMUSCULAR

## 2022-07-17 NOTE — Progress Notes (Signed)
Patient presented for B 12 injection to right deltoid, patient voiced no concerns nor showed any signs of distress during injection. 

## 2022-07-18 ENCOUNTER — Telehealth: Payer: Self-pay | Admitting: Internal Medicine

## 2022-07-18 NOTE — Telephone Encounter (Signed)
Copied from [place] [id number]. Topic: Medicare AWV >> Jul 18, 2022  2:14 PM Devoria Glassing wrote: Reason for CRM: Left message for patient to schedule Annual Wellness Visit.  Please schedule with Nurse Health Advisor Denisa O'Brien-Blaney, LPN at [facility]. This appt can be telephone or office visit.  Please call [phone] ask for [facility]

## 2022-07-22 ENCOUNTER — Inpatient Hospital Stay: Payer: Medicare HMO | Admitting: Oncology

## 2022-07-22 ENCOUNTER — Inpatient Hospital Stay: Payer: Medicare HMO

## 2022-07-23 ENCOUNTER — Ambulatory Visit: Payer: Medicare HMO

## 2022-07-23 ENCOUNTER — Ambulatory Visit: Payer: Medicare HMO | Attending: Medical

## 2022-07-23 DIAGNOSIS — R0609 Other forms of dyspnea: Secondary | ICD-10-CM | POA: Diagnosis not present

## 2022-07-23 DIAGNOSIS — I779 Disorder of arteries and arterioles, unspecified: Secondary | ICD-10-CM

## 2022-07-23 LAB — ECHOCARDIOGRAM COMPLETE
AR max vel: 2.38 cm2
AV Area VTI: 2.5 cm2
AV Area mean vel: 2.41 cm2
AV Mean grad: 3 mmHg
AV Peak grad: 6.4 mmHg
Ao pk vel: 1.26 m/s
Area-P 1/2: 2.93 cm2
Calc EF: 69.7 %
S' Lateral: 3 cm
Single Plane A2C EF: 74.7 %
Single Plane A4C EF: 62.3 %
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
## 2022-07-24 ENCOUNTER — Ambulatory Visit: Payer: Medicare HMO | Attending: Medical | Admitting: Medical

## 2022-07-24 ENCOUNTER — Encounter: Payer: Self-pay | Admitting: Medical

## 2022-07-24 VITALS — BP 110/60 | HR 73 | Ht 59.0 in | Wt 154.2 lb

## 2022-07-24 DIAGNOSIS — E782 Mixed hyperlipidemia: Secondary | ICD-10-CM | POA: Diagnosis not present

## 2022-07-24 DIAGNOSIS — I10 Essential (primary) hypertension: Secondary | ICD-10-CM | POA: Diagnosis not present

## 2022-07-24 DIAGNOSIS — I2510 Atherosclerotic heart disease of native coronary artery without angina pectoris: Secondary | ICD-10-CM | POA: Diagnosis not present

## 2022-07-24 DIAGNOSIS — D649 Anemia, unspecified: Secondary | ICD-10-CM | POA: Diagnosis not present

## 2022-07-24 DIAGNOSIS — I779 Disorder of arteries and arterioles, unspecified: Secondary | ICD-10-CM | POA: Diagnosis not present

## 2022-07-24 NOTE — Patient Instructions (Signed)
Medication Instructions:  - Your physician recommends that you continue on your current medications as directed. Please refer to the Current Medication list given to you today.  *If you need a refill on your cardiac medications before your next appointment, please call your pharmacy*   Lab Work: - none ordered  If you have labs (blood work) drawn today and your tests are completely normal, you will receive your results only by: [place] (if you have MyChart) OR A paper copy in the mail If you have any lab test that is abnormal or we need to change your treatment, we will call you to review the results.   Testing/Procedures: - none ordered   Follow-Up: At [facility], you and your health needs are our priority.  As part of our continuing mission to provide you with exceptional heart care, we have created designated Provider Care Teams.  These Care Teams include your primary Cardiologist (physician) and Advanced Practice Providers (APPs -  Physician Assistants and Nurse Practitioners) who all work together to provide you with the care you need, when you need it.  We recommend signing up for the patient portal called "MyChart".  Sign up information is provided on this After Visit Summary.  MyChart is used to connect with patients for Virtual Visits (Telemedicine).  Patients are able to view lab/test results, encounter notes, upcoming appointments, etc.  Non-urgent messages can be sent to your provider as well.   To learn more about what you can do with MyChart, go to [email or web address].    Your next appointment:   3 month(s)  The format for your next appointment:   In Person  Provider:   You may see Kathlyn Sacramento, MD or one of the following Advanced Practice Providers on your designated Care Team:    Cadence Kathlen Mody, [place]   Other Instructions N/a  Important Information About Sugar

## 2022-07-25 ENCOUNTER — Ambulatory Visit: Payer: Medicare HMO

## 2022-07-29 ENCOUNTER — Ambulatory Visit (INDEPENDENT_AMBULATORY_CARE_PROVIDER_SITE_OTHER): Payer: Medicare HMO | Admitting: *Deleted

## 2022-07-29 ENCOUNTER — Other Ambulatory Visit: Payer: Self-pay | Admitting: *Deleted

## 2022-07-29 ENCOUNTER — Telehealth: Payer: Self-pay | Admitting: *Deleted

## 2022-07-29 ENCOUNTER — Telehealth: Payer: Self-pay | Admitting: Internal Medicine

## 2022-07-29 DIAGNOSIS — E538 Deficiency of other specified B group vitamins: Secondary | ICD-10-CM

## 2022-07-29 MED ORDER — GABAPENTIN 300 MG PO CAPS: 300.0000 mg | ORAL_CAPSULE | Freq: Three times a day (TID) | ORAL | 1 refills | Status: DC

## 2022-07-29 MED ORDER — CYANOCOBALAMIN 1000 MCG/ML IJ SOLN
1000.0000 ug | Freq: Once | INTRAMUSCULAR | Status: AC
  Administered 2022-07-29: 1000 ug via INTRAMUSCULAR

## 2022-07-29 NOTE — Telephone Encounter (Signed)
Pt states the pharmacy has not sent a refill request. Pt would like refill on Gabapentin. Last refilled in October 2022. Please advise (medication pended for your approval)

## 2022-07-29 NOTE — Progress Notes (Signed)
Pt received B12 injection in left deltoid & tolerated it well with no issues or complaints.

## 2022-07-29 NOTE — Telephone Encounter (Signed)
Pt need refill on gabapentin sent to [facility]

## 2022-07-29 NOTE — Telephone Encounter (Signed)
Pt received 4th weekly dose of B12 injection & wants to know if she is to return to monthly?  Please advise.

## 2022-07-30 MED ORDER — GABAPENTIN 300 MG PO CAPS: 300.0000 mg | ORAL_CAPSULE | Freq: Three times a day (TID) | ORAL | 1 refills | Status: DC

## 2022-07-30 NOTE — Telephone Encounter (Signed)
Medication has been refilled.

## 2022-07-31 ENCOUNTER — Telehealth: Payer: Self-pay | Admitting: *Deleted

## 2022-07-31 DIAGNOSIS — D649 Anemia, unspecified: Secondary | ICD-10-CM

## 2022-07-31 LAB — FECAL OCCULT BLOOD, IMMUNOCHEMICAL: Fecal Occult Bld: POSITIVE — AB

## 2022-07-31 NOTE — Telephone Encounter (Signed)
LMTCB

## 2022-07-31 NOTE — Telephone Encounter (Signed)
CRITICAL VALUE STICKER  CRITICAL VALUE: + IFOB  RECEIVER (on-site recipient of call): Jari Favre, CMA/XT  DATE & TIME NOTIFIED: 07/31/22 @ 2:50pm  MESSENGER (representative from lab): [facility] Cecille Rubin)  MD NOTIFIED: Dr. Derrel Nip  TIME OF NOTIFICATION:2:50pm  RESPONSE:

## 2022-07-31 NOTE — Telephone Encounter (Signed)
Please ask patient to return for  blood work that was lost during August 24 collection.  Please schedule her an OV ASAP IN PERSON BECAUSE HER STOOL TEST WAS POSITIVE FOR BLOOD AND I NEED TO RECHECK IT

## 2022-07-31 NOTE — Telephone Encounter (Signed)
Please transfer to Janett Billow, CMA when pt returns call.

## 2022-08-01 ENCOUNTER — Encounter: Payer: Self-pay | Admitting: Internal Medicine

## 2022-08-01 ENCOUNTER — Ambulatory Visit (INDEPENDENT_AMBULATORY_CARE_PROVIDER_SITE_OTHER): Payer: Medicare HMO | Admitting: Internal Medicine

## 2022-08-01 VITALS — BP 142/80 | HR 59 | Temp 98.0°F | Ht 59.0 in | Wt 154.0 lb

## 2022-08-01 DIAGNOSIS — D649 Anemia, unspecified: Secondary | ICD-10-CM

## 2022-08-01 LAB — IFE AND PE, RANDOM URINE

## 2022-08-01 LAB — CBC WITH DIFFERENTIAL/PLATELET
Basophils Absolute: 0.1 10*3/uL (ref 0.0–0.1)
Basophils Relative: 1 % (ref 0.0–3.0)
Eosinophils Absolute: 0.2 10*3/uL (ref 0.0–0.7)
Eosinophils Relative: 3.9 % (ref 0.0–5.0)
HCT: 33.4 % — ABNORMAL LOW (ref 36.0–46.0)
Hemoglobin: 10.8 g/dL — ABNORMAL LOW (ref 12.0–15.0)
Lymphocytes Relative: 24.6 % (ref 12.0–46.0)
Lymphs Abs: 1.4 10*3/uL (ref 0.7–4.0)
MCHC: 32.3 g/dL (ref 30.0–36.0)
MCV: 82.7 fl (ref 78.0–100.0)
Monocytes Absolute: 0.7 10*3/uL (ref 0.1–1.0)
Monocytes Relative: 12.8 % — ABNORMAL HIGH (ref 3.0–12.0)
Neutro Abs: 3.3 10*3/uL (ref 1.4–7.7)
Neutrophils Relative %: 57.7 % (ref 43.0–77.0)
Platelets: 279 10*3/uL (ref 150.0–400.0)
RBC: 4.04 Mil/uL (ref 3.87–5.11)
RDW: 28.4 % — ABNORMAL HIGH (ref 11.5–15.5)
WBC: 5.7 10*3/uL (ref 4.0–10.5)

## 2022-08-01 LAB — SPECIMEN STATUS REPORT

## 2022-08-01 NOTE — Progress Notes (Signed)
Subjective:  Patient ID: Suzanne Cole, female    DOB: 1951/01/19  Age: 71 y.o. MRN: [id number]  CC: The encounter diagnosis was Symptomatic anemia.   HPI Suzanne Cole presents for follow up on anemia,  iron/B12 deficient Chief Complaint  Patient presents with   Follow-up    Follow up +IFOB   71 yr old female with CAD,  diabetes and fatty liver ,  recent diagnosis of severe symptomatic anemia with b12 and iron deficiency both noted,   treated in ED on August 10 with transfusion of  2 units for hgb 5.7 .  Stool guiaic was negative on August 10.  presents for follow up CBC , and positive IFOB test .  CBC was not ordered byt not resulted in August  She feels much better since her transfusion .  She is taking iron and b12 daily.   Hematology referral in process,  appt was made but cancelled by patient due to multiple conflicts  Positive IFOB :  Has been having "trouble with her hemorrhoids"  due  to iron causing constipation.  She recently started taking a stimulant laxative prn and after a very large BM., had some bleeding and pain around the time of the positive FOBT.  Has been using hemorrhoid cream   Reviewed  incomplete colonoscopy done in 2021 and capsule colonoscopy in Jan 2022   Outpatient Medications Prior to Visit  Medication Sig Dispense Refill   albuterol (PROVENTIL) (2.5 MG/3ML) 0.083% nebulizer solution Take 3 mLs (2.5 mg total) by nebulization every 6 (six) hours as needed for wheezing or shortness of breath. 150 mL 1   albuterol (VENTOLIN HFA) 108 (90 Base) MCG/ACT inhaler INHALE 2 PUFFS BY MOUTH EVERY 6 HOURS AS NEEDED FOR WHEEZING OR SHORTNESS OF BREATH 18 g 2   amLODipine (NORVASC) 10 MG tablet TAKE 1 TABLET(10 MG) BY MOUTH DAILY FOR HIGH BLOOD PRESSURE 90 tablet 1   aspirin 81 MG tablet Take 81 mg by mouth as needed for pain.     carvedilol (COREG) 25 MG tablet Take 1 tablet (25 mg total) by mouth 2 (two) times daily. 180 tablet 3   cetirizine (ZYRTEC) 10 MG tablet  Take 10 mg by mouth daily.     Cholecalciferol (VITAMIN D3) 1000 units CHEW Chew 1 tablet by mouth daily.     Ferrous Sulfate (IRON) 142 (45 Fe) MG TBCR Take 1 tablet by mouth daily. 30 tablet 1   fluticasone (FLONASE) 50 MCG/ACT nasal spray Place 2 sprays into both nostrils daily. 16 g 6   gabapentin (NEURONTIN) 300 MG capsule Take 1 capsule (300 mg total) by mouth 3 (three) times daily. 270 capsule 1   HYDROcodone-acetaminophen (NORCO) 10-325 MG tablet Take 1 tablet by mouth every 6 (six) hours as needed. 120 tablet 0   methocarbamol (ROBAXIN) 750 MG tablet Take 1 tablet (750 mg total) by mouth 4 (four) times daily. 360 tablet 1   olopatadine (PATANOL) 0.1 % ophthalmic solution Place 1 drop into both eyes 2 (two) times daily. 5 mL 1   pantoprazole (PROTONIX) 40 MG tablet TAKE 1 TABLET(40 MG) BY MOUTH DAILY 90 tablet 3   rosuvastatin (CRESTOR) 40 MG tablet TAKE 1 TABLET(40 MG) BY MOUTH DAILY 90 tablet 3   No facility-administered medications prior to visit.    Review of Systems;  Patient denies headache, fevers, malaise, unintentional weight loss, skin rash, eye pain, sinus congestion and sinus pain, sore throat, dysphagia,  hemoptysis , cough, dyspnea, wheezing, chest  pain, palpitations, orthopnea, edema, abdominal pain, nausea, melena, diarrhea, constipation, flank pain, dysuria, hematuria, urinary  Frequency, nocturia, numbness, tingling, seizures,  Focal weakness, Loss of consciousness,  Tremor, insomnia, depression, anxiety, and suicidal ideation.      Objective:  BP (!) 142/80 (BP Location: Left Arm, Patient Position: Sitting, Cuff Size: Normal)   Pulse (!) 59   Temp 98 F (36.7 C) (Oral)   Ht '4\' 11"'$  (1.499 m)   Wt 154 lb (69.9 kg)   SpO2 98%   BMI 31.10 kg/m   BP Readings from Last 3 Encounters:  08/01/22 (!) 142/80  07/24/22 110/60  07/09/22 136/70    Wt Readings from Last 3 Encounters:  08/01/22 154 lb (69.9 kg)  07/24/22 154 lb 4 oz (70 kg)  07/09/22 154 lb 3.2 oz  (69.9 kg)    General appearance: alert, cooperative and appears stated age Ears: normal TM's and external ear canals both ears Throat: lips, mucosa, and tongue normal; teeth and gums normal Neck: no adenopathy, no carotid bruit, supple, symmetrical, trachea midline and thyroid not enlarged, symmetric, no tenderness/mass/nodules Back: symmetric, no curvature. ROM normal. No CVA tenderness. Lungs: clear to auscultation bilaterally Heart: regular rate and rhythm, S1, S2 normal, no murmur, click, rub or gallop Abdomen: soft, non-tender; bowel sounds normal; no masses,  no organomegaly Pulses: 2+ and symmetric Rectum: no stool in vault  internal hemorrhoids present, some pain with exam  Skin: Skin color, texture, turgor normal. No rashes or lesions Lymph nodes: Cervical, supraclavicular, and axillary nodes normal. Neuro:  awake and interactive with normal mood and affect. Higher cortical functions are normal. Speech is clear without word-finding difficulty or dysarthria. Extraocular movements are intact. Visual fields of both eyes are grossly intact. Sensation to light touch is grossly intact bilaterally of upper and lower extremities. Motor examination shows 4+/5 symmetric hand grip and upper extremity and 5/5 lower extremity strength. There is no pronation or drift. Gait is non-ataxic   Lab Results  Component Value Date   HGBA1C 6.0 (A) 04/03/2022   HGBA1C 6.4 01/01/2022   HGBA1C 6.2 07/13/2021    Lab Results  Component Value Date   CREATININE 1.14 (H) 06/20/2022   CREATININE 1.03 04/02/2022   CREATININE 1.15 01/01/2022    Lab Results  Component Value Date   WBC 5.7 08/01/2022   HGB 10.8 (L) 08/01/2022   HCT 33.4 (L) 08/01/2022   PLT 279.0 08/01/2022   GLUCOSE 113 (H) 06/20/2022   CHOL 110 04/02/2022   TRIG 178.0 (H) 04/02/2022   HDL 46.30 04/02/2022   LDLDIRECT 63.0 01/01/2022   LDLCALC 28 04/02/2022   ALT 10 06/20/2022   AST 18 06/20/2022   NA 142 06/20/2022   K 4.3  06/20/2022   CL 111 06/20/2022   CREATININE 1.14 (H) 06/20/2022   BUN 19 06/20/2022   CO2 24 06/20/2022   TSH 1.060 06/19/2022   INR 1.0 06/20/2022   HGBA1C 6.0 (A) 04/03/2022   MICROALBUR 1.0 07/13/2021    NM Myocar Multi W/Spect W/Wall Motion / EF  Result Date: 06/25/2022 Challenging study Pharmacological myocardial perfusion imaging study with no significant  ischemia Small region fixed defect mid to distal anteroseptal wall noted, unable to exclude breast attenuation artifact Unable to visualize wall motion, ejection fraction not calculated secondary to significant GI uptake artifact No EKG changes concerning for ischemia at peak stress or in recovery. Likely low risk scan, consider alternate imaging study if clinically indicated Signed, Esmond Plants, MD, Ph.D [facility]  Assessment & Plan:   Problem List Items Addressed This Visit     Symptomatic anemia - Primary    With b12 and iron deficiencies noted.  Improving with supplementation.  The source of her iron deficiency is unclear and she is awaiting hematology evaluation. She is up to date on Colonoscopy and has internal hemorroids which bleed periodically  Lab Results  Component Value Date   WBC 5.7 08/01/2022   HGB 10.8 (L) 08/01/2022   HCT 33.4 (L) 08/01/2022   MCV 82.7 08/01/2022   PLT 279.0 08/01/2022   Lab Results  Component Value Date   IRON 77 08/01/2022   TIBC 389.2 08/01/2022   FERRITIN 28.8 08/01/2022   Lab Results  Component Value Date   VITAMINB12 580 08/01/2022         Relevant Orders   B12 and Folate Panel (Completed)   IBC + Ferritin (Completed)   CBC with Differential/Platelet (Completed)    I spent a total of  30 minutes with this patient in a face to face visit on the date of this encounter reviewing the last office visit with me in   August , her  most recent visit with cardiology ,    her colonoscopy in 2021 and capsule endoscopy in 2022,  patient's diet and exercise habits, home blood  pressure /blod sugar readings, recent ER visit including labs and imaging studies ,   and post visit ordering of testing and therapeutics.    Follow-up: No follow-ups on file.   [name], MD

## 2022-08-01 NOTE — Patient Instructions (Addendum)
Iron WILL make you constipated  I recommend using metamucil every night to manage your constipation more regularly  ( or citrucel, benefiber or miralax)  You should not use the pink pill more than twice  a week because your bowels can become addicted to it   Please reschedule the appt with the hematologist.  We need to figure out WHY you became so anemic

## 2022-08-01 NOTE — Telephone Encounter (Signed)
Spoke with pt and informed her of her lab results. Pt gave a verbal understanding and has been scheduled for this afternoon at 12:30.

## 2022-08-01 NOTE — Telephone Encounter (Signed)
Patient states she is returning Adair Laundry, [facility] call.  I transferred call to [place].

## 2022-08-02 LAB — IBC + FERRITIN
Ferritin: 28.8 ng/mL (ref 10.0–291.0)
Iron: 77 ug/dL (ref 42–145)
Saturation Ratios: 19.8 % — ABNORMAL LOW (ref 20.0–50.0)
TIBC: 389.2 ug/dL (ref 250.0–450.0)
Transferrin: 278 mg/dL (ref 212.0–360.0)

## 2022-08-02 LAB — B12 AND FOLATE PANEL
Folate: 10 ng/mL (ref >5.9)
Vitamin B-12: 580 pg/mL (ref 211–911)

## 2022-08-02 NOTE — Assessment & Plan Note (Signed)
With b12 and iron deficiencies noted.  Improving with supplementation.  The source of her iron deficiency is unclear and she is awaiting hematology evaluation. She is up to date on Colonoscopy and has internal hemorroids which bleed periodically  Lab Results  Component Value Date   WBC 5.7 08/01/2022   HGB 10.8 (L) 08/01/2022   HCT 33.4 (L) 08/01/2022   MCV 82.7 08/01/2022   PLT 279.0 08/01/2022   Lab Results  Component Value Date   IRON 77 08/01/2022   TIBC 389.2 08/01/2022   FERRITIN 28.8 08/01/2022   Lab Results  Component Value Date   VITAMINB12 580 08/01/2022

## 2022-08-12 ENCOUNTER — Other Ambulatory Visit: Payer: Self-pay | Admitting: Internal Medicine

## 2022-08-13 ENCOUNTER — Encounter: Payer: Self-pay | Admitting: Internal Medicine

## 2022-08-13 ENCOUNTER — Encounter: Payer: Self-pay | Admitting: Oncology

## 2022-08-13 ENCOUNTER — Inpatient Hospital Stay: Payer: Medicare HMO | Attending: Oncology | Admitting: Oncology

## 2022-08-13 ENCOUNTER — Telehealth: Payer: Self-pay

## 2022-08-13 ENCOUNTER — Ambulatory Visit: Payer: Medicare HMO

## 2022-08-13 ENCOUNTER — Inpatient Hospital Stay: Payer: Medicare HMO

## 2022-08-13 VITALS — BP 140/53 | HR 60 | Resp 18 | Ht 59.0 in | Wt 154.0 lb

## 2022-08-13 DIAGNOSIS — Z880 Allergy status to penicillin: Secondary | ICD-10-CM | POA: Insufficient documentation

## 2022-08-13 DIAGNOSIS — I2510 Atherosclerotic heart disease of native coronary artery without angina pectoris: Secondary | ICD-10-CM | POA: Insufficient documentation

## 2022-08-13 DIAGNOSIS — I509 Heart failure, unspecified: Secondary | ICD-10-CM | POA: Insufficient documentation

## 2022-08-13 DIAGNOSIS — D509 Iron deficiency anemia, unspecified: Secondary | ICD-10-CM | POA: Insufficient documentation

## 2022-08-13 DIAGNOSIS — K589 Irritable bowel syndrome without diarrhea: Secondary | ICD-10-CM | POA: Insufficient documentation

## 2022-08-13 DIAGNOSIS — Z90721 Acquired absence of ovaries, unilateral: Secondary | ICD-10-CM | POA: Insufficient documentation

## 2022-08-13 DIAGNOSIS — Z803 Family history of malignant neoplasm of breast: Secondary | ICD-10-CM | POA: Insufficient documentation

## 2022-08-13 DIAGNOSIS — Z8249 Family history of ischemic heart disease and other diseases of the circulatory system: Secondary | ICD-10-CM | POA: Diagnosis not present

## 2022-08-13 DIAGNOSIS — Z885 Allergy status to narcotic agent status: Secondary | ICD-10-CM | POA: Diagnosis not present

## 2022-08-13 DIAGNOSIS — Z888 Allergy status to other drugs, medicaments and biological substances status: Secondary | ICD-10-CM | POA: Insufficient documentation

## 2022-08-13 DIAGNOSIS — Z79899 Other long term (current) drug therapy: Secondary | ICD-10-CM | POA: Diagnosis not present

## 2022-08-13 DIAGNOSIS — Z905 Acquired absence of kidney: Secondary | ICD-10-CM | POA: Insufficient documentation

## 2022-08-13 DIAGNOSIS — E538 Deficiency of other specified B group vitamins: Secondary | ICD-10-CM | POA: Insufficient documentation

## 2022-08-13 DIAGNOSIS — M419 Scoliosis, unspecified: Secondary | ICD-10-CM | POA: Insufficient documentation

## 2022-08-13 DIAGNOSIS — Z85528 Personal history of other malignant neoplasm of kidney: Secondary | ICD-10-CM | POA: Insufficient documentation

## 2022-08-13 DIAGNOSIS — Z800 Family history of malignant neoplasm of digestive organs: Secondary | ICD-10-CM | POA: Diagnosis not present

## 2022-08-13 DIAGNOSIS — I130 Hypertensive heart and chronic kidney disease with heart failure and stage 1 through stage 4 chronic kidney disease, or unspecified chronic kidney disease: Secondary | ICD-10-CM | POA: Insufficient documentation

## 2022-08-13 DIAGNOSIS — I700 Atherosclerosis of aorta: Secondary | ICD-10-CM | POA: Diagnosis not present

## 2022-08-13 DIAGNOSIS — Z818 Family history of other mental and behavioral disorders: Secondary | ICD-10-CM | POA: Diagnosis not present

## 2022-08-13 DIAGNOSIS — Z87891 Personal history of nicotine dependence: Secondary | ICD-10-CM | POA: Insufficient documentation

## 2022-08-13 DIAGNOSIS — E785 Hyperlipidemia, unspecified: Secondary | ICD-10-CM | POA: Insufficient documentation

## 2022-08-13 DIAGNOSIS — N183 Chronic kidney disease, stage 3 unspecified: Secondary | ICD-10-CM | POA: Insufficient documentation

## 2022-08-13 DIAGNOSIS — R5383 Other fatigue: Secondary | ICD-10-CM | POA: Insufficient documentation

## 2022-08-13 DIAGNOSIS — D500 Iron deficiency anemia secondary to blood loss (chronic): Secondary | ICD-10-CM

## 2022-08-13 DIAGNOSIS — Z9079 Acquired absence of other genital organ(s): Secondary | ICD-10-CM | POA: Diagnosis not present

## 2022-08-13 DIAGNOSIS — Z833 Family history of diabetes mellitus: Secondary | ICD-10-CM | POA: Insufficient documentation

## 2022-08-13 DIAGNOSIS — D510 Vitamin B12 deficiency anemia due to intrinsic factor deficiency: Secondary | ICD-10-CM

## 2022-08-13 DIAGNOSIS — D508 Other iron deficiency anemias: Secondary | ICD-10-CM

## 2022-08-13 NOTE — Assessment & Plan Note (Addendum)
Hemoglobin has improved since started on oral iron supplementation.  Recommend continue and repeat cbc and iron panel in 2-3 weeks.

## 2022-08-13 NOTE — Telephone Encounter (Signed)
Patient states she is following up on her request for refill.  Patient states she needs to pick this up today and asked that we please send it to the pharmacy as soon as possible because they are not very quick on refilling.

## 2022-08-13 NOTE — Assessment & Plan Note (Signed)
Intrinsic factor antibody positive.  Recommend patient continues B12 monthly injections.  Refer to GI for EGD evaluation.
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
## 2022-08-14 ENCOUNTER — Other Ambulatory Visit: Payer: Self-pay | Admitting: Internal Medicine

## 2022-08-14 MED ORDER — HYDROCODONE-ACETAMINOPHEN 10-325 MG PO TABS: 1.0000 | ORAL_TABLET | Freq: Four times a day (QID) | ORAL | 0 refills | Status: DC | PRN

## 2022-08-14 MED ORDER — HYDROCODONE-ACETAMINOPHEN 5-325 MG PO TABS: 1.0000 | ORAL_TABLET | Freq: Four times a day (QID) | ORAL | 0 refills | Status: DC | PRN

## 2022-08-14 NOTE — Telephone Encounter (Signed)
Pt calling about medication refill

## 2022-08-15 NOTE — Telephone Encounter (Signed)
Spoke with pt to let her know that the Hydrocodone was sent in yesterday. Pt stated that when she went to pick it up the pharmacist said they had a rx for two different dose, 10-325 mg and 5-325 mg. Pt stated that she has been taking the 10-325 mg so she had the pharmacy cancel the 5-325 mg dose.

## 2022-08-19 ENCOUNTER — Other Ambulatory Visit: Payer: Self-pay | Admitting: Internal Medicine

## 2022-08-20 DIAGNOSIS — H1032 Unspecified acute conjunctivitis, left eye: Secondary | ICD-10-CM | POA: Diagnosis not present

## 2022-08-20 DIAGNOSIS — E113293 Type 2 diabetes mellitus with mild nonproliferative diabetic retinopathy without macular edema, bilateral: Secondary | ICD-10-CM | POA: Diagnosis not present

## 2022-08-20 DIAGNOSIS — H25813 Combined forms of age-related cataract, bilateral: Secondary | ICD-10-CM | POA: Diagnosis not present

## 2022-08-26 ENCOUNTER — Encounter: Payer: Self-pay | Admitting: Internal Medicine

## 2022-08-29 ENCOUNTER — Ambulatory Visit (INDEPENDENT_AMBULATORY_CARE_PROVIDER_SITE_OTHER): Payer: Medicare HMO

## 2022-08-29 DIAGNOSIS — E538 Deficiency of other specified B group vitamins: Secondary | ICD-10-CM | POA: Diagnosis not present

## 2022-08-29 DIAGNOSIS — Z23 Encounter for immunization: Secondary | ICD-10-CM

## 2022-08-29 MED ORDER — CYANOCOBALAMIN 1000 MCG/ML IJ SOLN
1000.0000 ug | Freq: Once | INTRAMUSCULAR | Status: AC
  Administered 2022-08-29: 1000 ug via INTRAMUSCULAR

## 2022-08-29 NOTE — Progress Notes (Signed)
Suzanne Cole presents today for injection per MD orders. B12 injection administered IM in left Upper Arm. Administration without incident. Patient tolerated well.  Gae Bon, cma

## 2022-09-03 ENCOUNTER — Inpatient Hospital Stay: Payer: Medicare HMO

## 2022-09-03 DIAGNOSIS — D500 Iron deficiency anemia secondary to blood loss (chronic): Secondary | ICD-10-CM

## 2022-09-03 DIAGNOSIS — I2510 Atherosclerotic heart disease of native coronary artery without angina pectoris: Secondary | ICD-10-CM | POA: Diagnosis not present

## 2022-09-03 DIAGNOSIS — D509 Iron deficiency anemia, unspecified: Secondary | ICD-10-CM | POA: Diagnosis not present

## 2022-09-03 DIAGNOSIS — E785 Hyperlipidemia, unspecified: Secondary | ICD-10-CM | POA: Diagnosis not present

## 2022-09-03 DIAGNOSIS — E538 Deficiency of other specified B group vitamins: Secondary | ICD-10-CM | POA: Diagnosis not present

## 2022-09-03 DIAGNOSIS — I509 Heart failure, unspecified: Secondary | ICD-10-CM | POA: Diagnosis not present

## 2022-09-03 DIAGNOSIS — K589 Irritable bowel syndrome without diarrhea: Secondary | ICD-10-CM | POA: Diagnosis not present

## 2022-09-03 DIAGNOSIS — R5383 Other fatigue: Secondary | ICD-10-CM | POA: Diagnosis not present

## 2022-09-03 DIAGNOSIS — I130 Hypertensive heart and chronic kidney disease with heart failure and stage 1 through stage 4 chronic kidney disease, or unspecified chronic kidney disease: Secondary | ICD-10-CM | POA: Diagnosis not present

## 2022-09-03 DIAGNOSIS — N183 Chronic kidney disease, stage 3 unspecified: Secondary | ICD-10-CM | POA: Diagnosis not present

## 2022-09-03 LAB — CBC WITH DIFFERENTIAL/PLATELET
Abs Immature Granulocytes: 0 10*3/uL (ref 0.00–0.07)
Band Neutrophils: 0 %
Basophils Absolute: 0 10*3/uL (ref 0.0–0.1)
Basophils Relative: 0 %
Blasts: 0 %
Eosinophils Absolute: 0.2 10*3/uL (ref 0.0–0.5)
Eosinophils Relative: 4 %
HCT: 37.7 % (ref 36.0–46.0)
Hemoglobin: 11.9 g/dL — ABNORMAL LOW (ref 12.0–15.0)
Lymphocytes Relative: 31 %
Lymphs Abs: 1.9 10*3/uL (ref 0.7–4.0)
MCH: 28.3 pg (ref 26.0–34.0)
MCHC: 31.6 g/dL (ref 30.0–36.0)
MCV: 89.8 fL (ref 80.0–100.0)
Metamyelocytes Relative: 0 %
Monocytes Absolute: 0.8 10*3/uL (ref 0.1–1.0)
Monocytes Relative: 13 %
Myelocytes: 0 %
Neutro Abs: 3.2 10*3/uL (ref 1.7–7.7)
Neutrophils Relative %: 52 %
Other: 0 %
Platelets: 268 10*3/uL (ref 150–400)
Promyelocytes Relative: 0 %
RBC: 4.2 MIL/uL (ref 3.87–5.11)
RDW: 16.5 % — ABNORMAL HIGH (ref 11.5–15.5)
WBC: 6.1 10*3/uL (ref 4.0–10.5)
nRBC: 0 % (ref 0.0–0.2)
nRBC: 0 /100 WBC

## 2022-09-03 LAB — IRON AND TIBC
Iron: 77 ug/dL (ref 28–170)
Saturation Ratios: 19 % (ref 10.4–31.8)
TIBC: 403 ug/dL (ref 250–450)
UIBC: 326 ug/dL

## 2022-09-03 LAB — RETIC PANEL
Immature Retic Fract: 19.1 % — ABNORMAL HIGH (ref 2.3–15.9)
RBC.: 4.14 MIL/uL (ref 3.87–5.11)
Retic Count, Absolute: 98.5 10*3/uL (ref 19.0–186.0)
Retic Ct Pct: 2.4 % (ref 0.4–3.1)
Reticulocyte Hemoglobin: 34.2 pg (ref >27.9)

## 2022-09-03 LAB — TECHNOLOGIST SMEAR REVIEW: Plt Morphology: ADEQUATE

## 2022-09-03 LAB — FERRITIN: Ferritin: 26 ng/mL (ref 11–307)

## 2022-09-05 ENCOUNTER — Ambulatory Visit (INDEPENDENT_AMBULATORY_CARE_PROVIDER_SITE_OTHER): Payer: Medicare HMO

## 2022-09-05 VITALS — Ht 59.0 in | Wt 154.0 lb

## 2022-09-05 DIAGNOSIS — Z0000 Encounter for general adult medical examination without abnormal findings: Secondary | ICD-10-CM

## 2022-09-05 NOTE — Patient Instructions (Addendum)
Suzanne Cole , Thank you for taking time to come for your Medicare Wellness Visit. I appreciate your ongoing commitment to your health goals. Please review the following plan we discussed and let me know if I can assist you in the future.   These are the goals we discussed:  Goals       Patient Stated     Weight goal 125lb (pt-stated)      Use peddler daily for exercise Healthy portion controlled diet         This is a list of the screening recommended for you and due dates:  Health Maintenance  Topic Date Due   Eye exam for diabetics  09/10/2022*   Complete foot exam   09/11/2022*   Zoster (Shingles) Vaccine (1 of 2) 12/06/2022*   Pneumonia Vaccine (2 - PPSV23 or PCV20) 09/06/2023*   Hemoglobin A1C  10/04/2022   Cologuard (Stool DNA test)  01/31/2023   Mammogram  03/27/2023   Yearly kidney function blood test for diabetes  06/21/2023   Yearly kidney health urinalysis for diabetes  07/05/2023   Tetanus Vaccine  08/20/2023   Medicare Annual Wellness Visit  10/06/2023   Flu Shot  Completed   DEXA scan (bone density measurement)  Completed   Hepatitis C Screening: USPSTF Recommendation to screen - Ages 18-79 yo.  Completed   HPV Vaccine  Aged Out   COVID-19 Vaccine  Discontinued  *Topic was postponed. The date shown is not the original due date.    Advanced directives:End of life planning; Advanced aging; Advanced directives discussed.  No HCPOA/Living Will.  Additional information declined at this time. Additional information available in office as needed.   Conditions/risks identified: none new  Next appointment: Follow up in one year for your annual wellness visit    Preventive Care 65 Years and Older, Female Preventive care refers to lifestyle choices and visits with your health care provider that can promote health and wellness. What does preventive care include? A yearly physical exam. This is also called an annual well check. Dental exams once or twice a  year. Routine eye exams. Ask your health care provider how often you should have your eyes checked. Personal lifestyle choices, including: Daily care of your teeth and gums. Regular physical activity. Eating a healthy diet. Avoiding tobacco and drug use. Limiting alcohol use. Practicing safe sex. Taking low-dose aspirin every day. Taking vitamin and mineral supplements as recommended by your health care provider. What happens during an annual well check? The services and screenings done by your health care provider during your annual well check will depend on your age, overall health, lifestyle risk factors, and family history of disease. Counseling  Your health care provider may ask you questions about your: Alcohol use. Tobacco use. Drug use. Emotional well-being. Home and relationship well-being. Sexual activity. Eating habits. History of falls. Memory and ability to understand (cognition). Work and work Statistician. Reproductive health. Screening  You may have the following tests or measurements: Height, weight, and BMI. Blood pressure. Lipid and cholesterol levels. These may be checked every 5 years, or more frequently if you are over 32 years old. Skin check. Lung cancer screening. You may have this screening every year starting at age 71 if you have a 30-pack-year history of smoking and currently smoke or have quit within the past 15 years. Fecal occult blood test (FOBT) of the stool. You may have this test every year starting at age 51. Flexible sigmoidoscopy or colonoscopy. You may have  a sigmoidoscopy every 5 years or a colonoscopy every 10 years starting at age 35. Hepatitis C blood test. Hepatitis B blood test. Sexually transmitted disease (STD) testing. Diabetes screening. This is done by checking your blood sugar (glucose) after you have not eaten for a while (fasting). You may have this done every 1-3 years. Bone density scan. This is done to screen for  osteoporosis. You may have this done starting at age 30. Mammogram. This may be done every 1-2 years. Talk to your health care provider about how often you should have regular mammograms. Talk with your health care provider about your test results, treatment options, and if necessary, the need for more tests. Vaccines  Your health care provider may recommend certain vaccines, such as: Influenza vaccine. This is recommended every year. Tetanus, diphtheria, and acellular pertussis (Tdap, Td) vaccine. You may need a Td booster every 10 years. Zoster vaccine. You may need this after age 31. Pneumococcal 13-valent conjugate (PCV13) vaccine. One dose is recommended after age 39. Pneumococcal polysaccharide (PPSV23) vaccine. One dose is recommended after age 15. Talk to your health care provider about which screenings and vaccines you need and how often you need them. This information is not intended to replace advice given to you by your health care provider. Make sure you discuss any questions you have with your health care provider. Document Released: 11/24/2015 Document Revised: 07/17/2016 Document Reviewed: 08/29/2015 Elsevier Interactive Patient Education  2017 [place] Prevention in the Home Falls can cause injuries. They can happen to people of all ages. There are many things you can do to make your home safe and to help prevent falls. What can I do on the outside of my home? Regularly fix the edges of walkways and driveways and fix any cracks. Remove anything that might make you trip as you walk through a door, such as a raised step or threshold. Trim any bushes or trees on the path to your home. Use bright outdoor lighting. Clear any walking paths of anything that might make someone trip, such as rocks or tools. Regularly check to see if handrails are loose or broken. Make sure that both sides of any steps have handrails. Any raised decks and porches should have guardrails on  the edges. Have any leaves, snow, or ice cleared regularly. Use sand or salt on walking paths during winter. Clean up any spills in your garage right away. This includes oil or grease spills. What can I do in the bathroom? Use night lights. Install grab bars by the toilet and in the tub and shower. Do not use towel bars as grab bars. Use non-skid mats or decals in the tub or shower. If you need to sit down in the shower, use a plastic, non-slip stool. Keep the floor dry. Clean up any water that spills on the floor as soon as it happens. Remove soap buildup in the tub or shower regularly. Attach bath mats securely with double-sided non-slip rug tape. Do not have throw rugs and other things on the floor that can make you trip. What can I do in the bedroom? Use night lights. Make sure that you have a light by your bed that is easy to reach. Do not use any sheets or blankets that are too big for your bed. They should not hang down onto the floor. Have a firm chair that has side arms. You can use this for support while you get dressed. Do not have throw rugs and  other things on the floor that can make you trip. What can I do in the kitchen? Clean up any spills right away. Avoid walking on wet floors. Keep items that you use a lot in easy-to-reach places. If you need to reach something above you, use a strong step stool that has a grab bar. Keep electrical cords out of the way. Do not use floor polish or wax that makes floors slippery. If you must use wax, use non-skid floor wax. Do not have throw rugs and other things on the floor that can make you trip. What can I do with my stairs? Do not leave any items on the stairs. Make sure that there are handrails on both sides of the stairs and use them. Fix handrails that are broken or loose. Make sure that handrails are as long as the stairways. Check any carpeting to make sure that it is firmly attached to the stairs. Fix any carpet that is loose  or worn. Avoid having throw rugs at the top or bottom of the stairs. If you do have throw rugs, attach them to the floor with carpet tape. Make sure that you have a light switch at the top of the stairs and the bottom of the stairs. If you do not have them, ask someone to add them for you. What else can I do to help prevent falls? Wear shoes that: Do not have high heels. Have rubber bottoms. Are comfortable and fit you well. Are closed at the toe. Do not wear sandals. If you use a stepladder: Make sure that it is fully opened. Do not climb a closed stepladder. Make sure that both sides of the stepladder are locked into place. Ask someone to hold it for you, if possible. Clearly mark and make sure that you can see: Any grab bars or handrails. First and last steps. Where the edge of each step is. Use tools that help you move around (mobility aids) if they are needed. These include: Canes. Walkers. Scooters. Crutches. Turn on the lights when you go into a dark area. Replace any light bulbs as soon as they burn out. Set up your furniture so you have a clear path. Avoid moving your furniture around. If any of your floors are uneven, fix them. If there are any pets around you, be aware of where they are. Review your medicines with your doctor. Some medicines can make you feel dizzy. This can increase your chance of falling. Ask your doctor what other things that you can do to help prevent falls. This information is not intended to replace advice given to you by your health care provider. Make sure you discuss any questions you have with your health care provider. Document Released: 08/24/2009 Document Revised: 04/04/2016 Document Reviewed: 12/02/2014 Elsevier Interactive Patient Education  2017 [place].  Opioid Pain Medicine Management Opioids are powerful medicines that are used to treat moderate to severe pain. When used for short periods of time, they can help you to: Sleep  better. Do better in physical or occupational therapy. Feel better in the first few days after an injury. Recover from surgery. Opioids should be taken with the supervision of a trained health care provider. They should be taken for the shortest period of time possible. This is because opioids can be addictive, and the longer you take opioids, the greater your risk of addiction. This addiction can also be called opioid use disorder. What are the risks? Using opioid pain medicines for longer than  3 days increases your risk of side effects. Side effects include: Constipation. Nausea and vomiting. Breathing difficulties (respiratory depression). Drowsiness. Confusion. Opioid use disorder. Itching. Taking opioid pain medicine for a long period of time can affect your ability to do daily tasks. It also puts you at risk for: Motor vehicle crashes. Depression. Suicide. Heart attack. Overdose, which can be life-threatening. What is a pain treatment plan? A pain treatment plan is an agreement between you and your health care provider. Pain is unique to each person, and treatments vary depending on your condition. To manage your pain, you and your health care provider need to work together. To help you do this: Discuss the goals of your treatment, including how much pain you might expect to have and how you will manage the pain. Review the risks and benefits of taking opioid medicines. Remember that a good treatment plan uses more than one approach and minimizes the chance of side effects. Be honest about the amount of medicines you take and about any drug or alcohol use. Get pain medicine prescriptions from only one health care provider. Pain can be managed with many types of alternative treatments. Ask your health care provider to refer you to one or more specialists who can help you manage pain through: Physical or occupational therapy. Counseling (cognitive behavioral therapy). Good  nutrition. Biofeedback. Massage. Meditation. Non-opioid medicine. Following a gentle exercise program. How to use opioid pain medicine Taking medicine Take your pain medicine exactly as told by your health care provider. Take it only when you need it. If your pain gets less severe, you may take less than your prescribed dose if your health care provider approves. If you are not having pain, do nottake pain medicine unless your health care provider tells you to take it. If your pain is severe, do nottry to treat it yourself by taking more pills than instructed on your prescription. Contact your health care provider for help. Write down the times when you take your pain medicine. It is easy to become confused while on pain medicine. Writing the time can help you avoid overdose. Take other over-the-counter or prescription medicines only as told by your health care provider. Keeping yourself and others safe  While you are taking opioid pain medicine: Do not drive, use machinery, or power tools. Do not sign legal documents. Do not drink alcohol. Do not take sleeping pills. Do not supervise children by yourself. Do not do activities that require climbing or being in high places. Do not go to a lake, river, ocean, spa, or swimming pool. Do not share your pain medicine with anyone. Keep pain medicine in a locked cabinet or in a secure area where pets and children cannot reach it. Stopping your use of opioids If you have been taking opioid medicine for more than a few weeks, you may need to slowly decrease (taper) how much you take until you stop completely. Tapering your use of opioids can decrease your risk of symptoms of withdrawal, such as: Pain and cramping in the abdomen. Nausea. Sweating. Sleepiness. Restlessness. Uncontrollable shaking (tremors). Cravings for the medicine. Do not attempt to taper your use of opioids on your own. Talk with your health care provider about how to do  this. Your health care provider may prescribe a step-down schedule based on how much medicine you are taking and how long you have been taking it. Getting rid of leftover pills Do not save any leftover pills. Get rid of leftover pills safely by:  Taking the medicine to a prescription take-back program. This is usually offered by the county or law enforcement. Bringing them to a pharmacy that has a drug disposal container. Flushing them down the toilet. Check the label or package insert of your medicine to see whether this is safe to do. Throwing them out in the trash. Check the label or package insert of your medicine to see whether this is safe to do. If it is safe to throw it out, remove the medicine from the original container, put it into a sealable bag or container, and mix it with used coffee grounds, food scraps, dirt, or cat litter before putting it in the trash. Follow these instructions at home: Activity Do exercises as told by your health care provider. Avoid activities that make your pain worse. Return to your normal activities as told by your health care provider. Ask your health care provider what activities are safe for you. General instructions You may need to take these actions to prevent or treat constipation: Drink enough fluid to keep your urine pale yellow. Take over-the-counter or prescription medicines. Eat foods that are high in fiber, such as beans, whole grains, and fresh fruits and vegetables. Limit foods that are high in fat and processed sugars, such as fried or sweet foods. Keep all follow-up visits. This is important. Where to find support If you have been taking opioids for a long time, you may benefit from receiving support for quitting from a local support group or counselor. Ask your health care provider for a referral to these resources in your area. Where to find more information Centers for Disease Control and Prevention (CDC): http://www.wolf.info/ [place] Food and  Drug Administration (FDA): [place] Get help right away if: You may have taken too much of an opioid (overdosed). Common symptoms of an overdose: Your breathing is slower or more shallow than normal. You have a very slow heartbeat (pulse). You have slurred speech. You have nausea and vomiting. Your pupils become very small. You have other potential symptoms: You are very confused. You faint or feel like you will faint. You have cold, clammy skin. You have blue lips or fingernails. You have thoughts of harming yourself or harming others. These symptoms may represent a serious problem that is an emergency. Do not wait to see if the symptoms will go away. Get medical help right away. Call your local emergency services (911 in the [place] Do not drive yourself to the hospital.  If you ever feel like you may hurt yourself or others, or have thoughts about taking your own life, get help right away. Go to your nearest emergency department or: Call your local emergency services (911 in the [place] Call the [facility] 5485731742 in the [place] Call a suicide crisis helpline, such as the [facility] at (747)756-1295 or 988 in the [place]. This is open 24 hours a day in the [place] Text the Crisis Text Line at 231 166 6307 (in the [place].). Summary Opioid medicines can help you manage moderate to severe pain for a short period of time. A pain treatment plan is an agreement between you and your health care provider. Discuss the goals of your treatment, including how much pain you might expect to have and how you will manage the pain. If you think that you or someone else may have taken too much of an opioid, get medical help right away. This information is not intended to replace advice given to you by your health  care provider. Make sure you discuss any questions you have with your health care provider. Document Revised: 05/23/2021 Document Reviewed:  02/07/2021 Elsevier Patient Education  [place].
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
## 2022-09-06 ENCOUNTER — Telehealth: Payer: Self-pay | Admitting: Internal Medicine

## 2022-09-06 ENCOUNTER — Other Ambulatory Visit: Payer: Self-pay | Admitting: Internal Medicine

## 2022-09-06 NOTE — Telephone Encounter (Signed)
Patient called and would like her HYDROcodone-acetaminophen ([place]) 10-325 MG tablet refilled,she knows it is not due for a refill until 09/14/2022. Patient stated she is calling refill in early because she wants to make sure it gets refilled on time. Patient would like Janett Billow to call her when this is filled.

## 2022-09-08 NOTE — Telephone Encounter (Signed)
Patient comment: I am getting in touch with you now it was not sent in yet I am due 11-4 for refill last month it was not call in on time so I had to wait an extra day please call in pharmacy for me asap so it will be there by the 4th..thx for your help in this matter

## 2022-09-09 MED ORDER — HYDROCODONE-ACETAMINOPHEN 10-325 MG PO TABS: 1.0000 | ORAL_TABLET | Freq: Four times a day (QID) | ORAL | 0 refills | Status: DC | PRN

## 2022-09-09 NOTE — Telephone Encounter (Signed)
Pt is aware that medication was refilled today.

## 2022-09-12 LAB — COPPER, SERUM: Copper: 83 ug/dL (ref 80–158)

## 2022-09-13 ENCOUNTER — Telehealth: Payer: Self-pay

## 2022-09-13 DIAGNOSIS — D500 Iron deficiency anemia secondary to blood loss (chronic): Secondary | ICD-10-CM

## 2022-09-13 NOTE — Telephone Encounter (Signed)
-----   Message from Earlie Server, MD sent at 09/13/2022  9:11 AM EDT ----- Please let patient know that her blood count and iron level have improved. Please continue her iron supplementation. No need for IV iron. Plan to repeat levels in 4 months.  Follow up in 4 months labs prior to MD please order cbc iron tibc ferritin B12. Thanks.

## 2022-09-13 NOTE — Telephone Encounter (Signed)
Pt informed via mychart. Please schedule and inform pt of appts:   Lab in 4 months (cbc,iron, ferr, b12)  MD 1-2 days AFTER labs.

## 2022-10-01 ENCOUNTER — Ambulatory Visit (INDEPENDENT_AMBULATORY_CARE_PROVIDER_SITE_OTHER): Payer: Medicare HMO

## 2022-10-01 DIAGNOSIS — E538 Deficiency of other specified B group vitamins: Secondary | ICD-10-CM | POA: Diagnosis not present

## 2022-10-01 MED ORDER — CYANOCOBALAMIN 1000 MCG/ML IJ SOLN
1000.0000 ug | Freq: Once | INTRAMUSCULAR | Status: AC
  Administered 2022-10-01: 1000 ug via INTRAMUSCULAR

## 2022-10-01 NOTE — Progress Notes (Signed)
Pt presented for their vitamin B12 injection. Pt was identified through two identifiers. Pt tolerated shot well in their left  deltoid.   [place], CMA

## 2022-10-07 ENCOUNTER — Encounter: Payer: Self-pay | Admitting: Internal Medicine

## 2022-10-07 ENCOUNTER — Ambulatory Visit (INDEPENDENT_AMBULATORY_CARE_PROVIDER_SITE_OTHER): Payer: Medicare HMO | Admitting: Internal Medicine

## 2022-10-07 VITALS — BP 146/80 | HR 74 | Temp 97.7°F | Ht 59.0 in | Wt 155.8 lb

## 2022-10-07 DIAGNOSIS — E1169 Type 2 diabetes mellitus with other specified complication: Secondary | ICD-10-CM

## 2022-10-07 DIAGNOSIS — E669 Obesity, unspecified: Secondary | ICD-10-CM | POA: Diagnosis not present

## 2022-10-07 DIAGNOSIS — E1159 Type 2 diabetes mellitus with other circulatory complications: Secondary | ICD-10-CM

## 2022-10-07 DIAGNOSIS — I130 Hypertensive heart and chronic kidney disease with heart failure and stage 1 through stage 4 chronic kidney disease, or unspecified chronic kidney disease: Secondary | ICD-10-CM

## 2022-10-07 DIAGNOSIS — D649 Anemia, unspecified: Secondary | ICD-10-CM

## 2022-10-07 DIAGNOSIS — I2510 Atherosclerotic heart disease of native coronary artery without angina pectoris: Secondary | ICD-10-CM

## 2022-10-07 DIAGNOSIS — I152 Hypertension secondary to endocrine disorders: Secondary | ICD-10-CM

## 2022-10-07 DIAGNOSIS — E785 Hyperlipidemia, unspecified: Secondary | ICD-10-CM | POA: Diagnosis not present

## 2022-10-07 DIAGNOSIS — E113293 Type 2 diabetes mellitus with mild nonproliferative diabetic retinopathy without macular edema, bilateral: Secondary | ICD-10-CM | POA: Diagnosis not present

## 2022-10-07 DIAGNOSIS — I5030 Unspecified diastolic (congestive) heart failure: Secondary | ICD-10-CM

## 2022-10-07 DIAGNOSIS — E538 Deficiency of other specified B group vitamins: Secondary | ICD-10-CM | POA: Diagnosis not present

## 2022-10-07 DIAGNOSIS — H43811 Vitreous degeneration, right eye: Secondary | ICD-10-CM | POA: Diagnosis not present

## 2022-10-07 DIAGNOSIS — N183 Chronic kidney disease, stage 3 unspecified: Secondary | ICD-10-CM | POA: Diagnosis not present

## 2022-10-07 DIAGNOSIS — I10 Essential (primary) hypertension: Secondary | ICD-10-CM

## 2022-10-07 DIAGNOSIS — I70209 Unspecified atherosclerosis of native arteries of extremities, unspecified extremity: Secondary | ICD-10-CM

## 2022-10-07 DIAGNOSIS — H25813 Combined forms of age-related cataract, bilateral: Secondary | ICD-10-CM | POA: Diagnosis not present

## 2022-10-07 LAB — HM DIABETES EYE EXAM

## 2022-10-07 LAB — LIPID PANEL
Cholesterol: 119 mg/dL (ref 0–200)
HDL: 46.8 mg/dL (ref >39.00)
NonHDL: 72.1
Total CHOL/HDL Ratio: 3
Triglycerides: 246 mg/dL — ABNORMAL HIGH (ref 0.0–149.0)
VLDL: 49.2 mg/dL — ABNORMAL HIGH (ref 0.0–40.0)

## 2022-10-07 LAB — COMPREHENSIVE METABOLIC PANEL
ALT: 13 U/L (ref 0–35)
AST: 18 U/L (ref 0–37)
Albumin: 4.4 g/dL (ref 3.5–5.2)
Alkaline Phosphatase: 57 U/L (ref 39–117)
BUN: 12 mg/dL (ref 6–23)
CO2: 28 mEq/L (ref 19–32)
Calcium: 9.4 mg/dL (ref 8.4–10.5)
Chloride: 101 mEq/L (ref 96–112)
Creatinine, Ser: 1.04 mg/dL (ref 0.40–1.20)
GFR: 54.21 mL/min — ABNORMAL LOW (ref >60.00)
Glucose, Bld: 102 mg/dL — ABNORMAL HIGH (ref 70–99)
Potassium: 4.4 mEq/L (ref 3.5–5.1)
Sodium: 137 mEq/L (ref 135–145)
Total Bilirubin: 0.7 mg/dL (ref 0.2–1.2)
Total Protein: 7.7 g/dL (ref 6.0–8.3)

## 2022-10-07 LAB — LDL CHOLESTEROL, DIRECT: Direct LDL: 36 mg/dL

## 2022-10-07 LAB — HEMOGLOBIN A1C: Hgb A1c MFr Bld: 5.8 % (ref 4.6–6.5)

## 2022-10-07 MED ORDER — HYDROCODONE-ACETAMINOPHEN 10-325 MG PO TABS: 1.0000 | ORAL_TABLET | Freq: Four times a day (QID) | ORAL | 0 refills | Status: DC | PRN

## 2022-10-07 NOTE — Assessment & Plan Note (Signed)
She has been asymptomatic since her cardiac catheterization in 2016

## 2022-10-07 NOTE — Assessment & Plan Note (Signed)
Eye exam is scheduled for today.  Type 2 DM confirmed with last a1c of  6.4 and fasting glucose of 125  .  Metformin recommended but deferred by patient.  zempic discussed but declined. She has reduced a1c to 6.0 with better diet.  Again reminded  to notify her optometrist and begin annual exams.     Lab Results  Component Value Date   HGBA1C 6.0 (A) 04/03/2022    Lab Results  Component Value Date   LABMICR 20.3 07/04/2022   MICROALBUR 1.0 07/13/2021   MICROALBUR <0.7 12/13/2016

## 2022-10-07 NOTE — Assessment & Plan Note (Signed)
Stable.  Advised the improved control of BP is needed,  ;  she is  avoiding   NSAIDS.  Lab Results  Component Value Date   CREATININE 1.14 (H) 06/20/2022   . Lab Results  Component Value Date   NA 142 06/20/2022   K 4.3 06/20/2022   CL 111 06/20/2022   CO2 24 06/20/2022   Lab Results  Component Value Date   LABMICR 20.3 07/04/2022   MICROALBUR 1.0 07/13/2021   MICROALBUR <0.7 12/13/2016

## 2022-10-07 NOTE — Patient Instructions (Addendum)
Your blood pressure is too high on carvedilol alone  I recommend that you resume the lowest possible does of amlodipine at 2.5 mg daily    I do recommend the RSV vaccine for you, .  It is now available at your pharmacy  and will protect you against the Respiratory Syncytial Virus

## 2022-10-07 NOTE — Assessment & Plan Note (Signed)
With b12 and iron deficiencies noted.  Hgb has improved with supplementation.   She is up to date on Colonoscopy and has internal hemorroids which bleed periodically.  Continue follow up with hematology  Lab Results  Component Value Date   WBC 6.1 09/03/2022   HGB 11.9 (L) 09/03/2022   HCT 37.7 09/03/2022   MCV 89.8 09/03/2022   PLT 268 09/03/2022   Lab Results  Component Value Date   IRON 77 09/03/2022   TIBC 403 09/03/2022   FERRITIN 26 09/03/2022   Lab Results  Component Value Date   VITAMINB12 580 08/01/2022

## 2022-10-07 NOTE — Progress Notes (Signed)
Subjective:  Patient ID: Suzanne Cole, female    DOB: 1951/10/26  Age: 72 y.o. MRN: [id number]  CC: The primary encounter diagnosis was Obesity, diabetes, and hypertension syndrome ([place]). 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   HPI Suzanne Cole presents for follow up on chronic back pain managed with opiates,  and type 2 DM diagnosed with a1c of  6.4 and fasting glucose of 125 , medications deferred by patient   Chief Complaint  Patient presents with   Follow-up    3 month follow up on diabetes   1) stress;  father in law died in 07/07/2023.  He was a multimillionaire,  husband is mentally impaired from a remote accident so she is embroiled In the paperwork  2)  b12 deficiency. Completed 4 weekly parenteral injections,  now monthly,   benefitting with improved energy   3) DM:   she feels generally well, is walking  several times per week and  not  checking blood sugars .  Denies any recent hypoglyemic  symptoms.   Taking his medications as directed. Following a carbohydrate modified diet 6 days per week. Denies numbness, burning and tingling of extremities. Appetite is good  4) Low back pain : she has been able to reduce the frequency of  using narcotics every 8 hours , less frequent since using a topical cream that is helping .  Last refill Nov 1.     5)  no constipation since using Align  gummies.   (Aggravated by iron )  6) CAD:  asymptomatic  ,sees [name] in December  7) anemia seeing hematology   8) HTN:  Hypertension: patient checks blood pressure once weekly  at home.  Readings have been for the most part > 130/80 at rest . Patient is following a reduced salt diet most days and is taking medications as prescribed.  However, she  forgot to take her med this morning    Outpatient Medications Prior to Visit  Medication Sig Dispense Refill   albuterol (PROVENTIL) (2.5 MG/3ML) 0.083% nebulizer solution Take 3 mLs (2.5 mg total) by nebulization every 6 (six) hours as needed for wheezing or shortness of breath. 150 mL 1   albuterol (VENTOLIN HFA) 108 (90 Base) MCG/ACT inhaler INHALE 2 PUFFS BY MOUTH EVERY 6 HOURS AS NEEDED FOR WHEEZING OR SHORTNESS OF BREATH 18 g 2   aspirin 81 MG tablet Take 81 mg by mouth as needed for pain.     carvedilol (COREG) 25 MG tablet Take 1 tablet (25 mg total) by mouth 2 (two) times daily. 180 tablet 3   cetirizine (ZYRTEC) 10 MG tablet Take 10 mg by mouth daily.     Cholecalciferol (VITAMIN D3) 1000 units CHEW Chew 1 tablet by mouth daily.     Ferrous Sulfate (IRON) 142 (45 Fe) MG TBCR Take 1 tablet by mouth daily. 30 tablet 1   fluticasone (FLONASE) 50 MCG/ACT nasal spray Place 2 sprays into both nostrils daily. 16 g 6   gabapentin (NEURONTIN) 300 MG capsule Take 1 capsule (300 mg total) by mouth 3 (three) times daily. 270 capsule 1   methocarbamol (ROBAXIN) 750 MG  tablet Take 1 tablet (750 mg total) by mouth 4 (four) times daily. 360 tablet 1   olopatadine (PATANOL) 0.1 % ophthalmic solution Place 1 drop into both eyes 2 (two) times daily. 5 mL 1   pantoprazole (PROTONIX) 40 MG tablet TAKE 1 TABLET(40 MG) BY MOUTH DAILY 90 tablet 3   rosuvastatin (CRESTOR) 40 MG tablet TAKE 1 TABLET(40 MG) BY MOUTH DAILY 90 tablet 3   HYDROcodone-acetaminophen (NORCO) 10-325 MG tablet Take 1 tablet by mouth every 6 (six) hours as needed. 120 tablet 0   No facility-administered medications prior to visit.    Review of Systems;  Patient denies headache, fevers, malaise, unintentional weight loss, skin rash, eye pain, sinus congestion and  sinus pain, sore throat, dysphagia,  hemoptysis , cough, dyspnea, wheezing, chest pain, palpitations, orthopnea, edema, abdominal pain, nausea, melena, diarrhea, constipation, flank pain, dysuria, hematuria, urinary  Frequency, nocturia, numbness, tingling, seizures,  Focal weakness, Loss of consciousness,  Tremor, insomnia, depression, anxiety, and suicidal ideation.      Objective:  BP (!) 146/80 (BP Location: Left Arm, Patient Position: Sitting, Cuff Size: Large)   Pulse 74   Temp 97.7 F (36.5 C) (Oral)   Ht _0  (1.499 m)   Wt 155 lb 12.8 oz (70.7 kg)   SpO2 97%   BMI 31.47 kg/m   BP Readings from Last 3 Encounters:  10/07/22 (!) 146/80  08/13/22 (!) 140/53  08/01/22 (!) 142/80    Wt Readings from Last 3 Encounters:  10/07/22 155 lb 12.8 oz (70.7 kg)  09/05/22 154 lb (69.9 kg)  08/13/22 154 lb (69.9 kg)    General appearance: alert, cooperative and appears stated age Ears: normal TM's and external ear canals both ears Throat: lips, mucosa, and tongue normal; teeth and gums normal Neck: no adenopathy, no carotid bruit, supple, symmetrical, trachea midline and thyroid not enlarged, symmetric, no tenderness/mass/nodules Back: symmetric, no curvature. ROM normal. No CVA tenderness. Lungs: clear to auscultation bilaterally Heart: regular rate and rhythm, S1, S2 normal, no murmur, click, rub or gallop Abdomen: soft, non-tender; bowel sounds normal; no masses,  no organomegaly Pulses: 2+ and symmetric Skin: Skin color, texture, turgor normal. No rashes or lesions Lymph nodes: Cervical, supraclavicular, and axillary nodes normal. Neuro:  awake and interactive with normal mood and affect. Higher cortical functions are normal. Speech is clear without word-finding difficulty or dysarthria. Extraocular movements are intact. Visual fields of both eyes are grossly intact. Sensation to light touch is grossly intact bilaterally of upper and lower extremities. Motor examination shows 4+/5  symmetric hand grip and upper extremity and 5/5 lower extremity strength. There is no pronation or drift. Gait is non-ataxic   Lab Results  Component Value Date   HGBA1C 5.8 10/07/2022   HGBA1C 6.0 (A) 04/03/2022   HGBA1C 6.4 01/01/2022    Lab Results  Component Value Date   CREATININE 1.04 10/07/2022   CREATININE 1.14 (H) 06/20/2022   CREATININE 1.03 04/02/2022    Lab Results  Component Value Date   WBC 6.1 09/03/2022   HGB 11.9 (L) 09/03/2022   HCT 37.7 09/03/2022   PLT 268 09/03/2022   GLUCOSE 102 (H) 10/07/2022   CHOL 119 10/07/2022   TRIG 246.0 (H) 10/07/2022   HDL 46.80 10/07/2022   LDLDIRECT 36.0 10/07/2022   LDLCALC 28 04/02/2022   ALT 13 10/07/2022   AST 18 10/07/2022   NA 137 10/07/2022   K 4.4 10/07/2022   CL 101 10/07/2022   CREATININE 1.04 10/07/2022  BUN 12 10/07/2022   CO2 28 10/07/2022   TSH 1.060 06/19/2022   INR 1.0 06/20/2022   HGBA1C 5.8 10/07/2022   MICROALBUR 1.0 07/13/2021    NM Myocar Multi W/Spect W/Wall Motion / EF  Result Date: 06/25/2022 Challenging study Pharmacological myocardial perfusion imaging study with no significant  ischemia Small region fixed defect mid to distal anteroseptal wall noted, unable to exclude breast attenuation artifact Unable to visualize wall motion, ejection fraction not calculated secondary to significant GI uptake artifact No EKG changes concerning for ischemia at peak stress or in recovery. Likely low risk scan, consider alternate imaging study if clinically indicated Signed, Esmond Plants, MD, Ph.D [facility]    Assessment & Plan:   Problem List Items Addressed This Visit     Atherosclerotic peripheral vascular disease [facility])    We reviewed the reason for statin adherence and the  findings of prior CT scan today..  Patient is tolerating rosuvastatin and LDL is well below 70  . Lab Results  Component Value Date   LDLCALC 28 04/02/2022   Lab Results  Component Value Date   CHOL 110 04/02/2022   HDL  46.30 04/02/2022   LDLCALC 28 04/02/2022   LDLDIRECT 63.0 01/01/2022   TRIG 178.0 (H) 04/02/2022   CHOLHDL 2 04/02/2022        B12 deficiency    She has completed 4 weekly doses and is returning monthly for parenteral therapy per her preference .  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  Advised the improved control of BP is needed,  ;  she is  avoiding   NSAIDS.  Lab Results  Component Value Date   CREATININE 1.14 (H) 06/20/2022  . Lab Results  Component Value Date   NA 142 06/20/2022   K 4.3 06/20/2022   CL 111 06/20/2022   CO2 24 06/20/2022   Lab Results  Component Value Date   LABMICR 20.3 07/04/2022   MICROALBUR 1.0 07/13/2021   MICROALBUR <0.7 12/13/2016          Coronary artery disease involving native coronary artery of native heart without angina pectoris    She has been asymptomatic since her cardiac catheterization in 2016        Hyperlipidemia LDL goal <70   Relevant Orders   Lipid Profile (Completed)   Direct LDL (Completed)   Hypertension    elevated today due to failure to take carvedilol this mornging.  Home readings have been mostly > 130/80 per memory. She is resistant to resuming amlodipine for unclear reasons ( she recalls that I stopped it for some reason although there is no documentation of discontinuation due to adverse effects) and is intolerant of ACE INhibitors/ARB due to hyperkalemia and hctz due to acute renal failure.       Obesity, diabetes, and hypertension syndrome ([place]) - Primary    Eye exam is scheduled for today.  Type 2 DM confirmed with last a1c of  6.4 and fasting glucose of 125  .  Metformin recommended but deferred by  patient.  zempic discussed but declined. She has reduced a1c to 6.0 with  better diet.  Again reminded  to notify her optometrist and begin annual exams.     Lab Results  Component Value Date   HGBA1C 6.0 (A) 04/03/2022   Lab Results  Component Value Date   LABMICR 20.3 07/04/2022   MICROALBUR 1.0 07/13/2021   MICROALBUR <0.7 12/13/2016          Relevant Orders   HgB A1c (Completed)   Comp Met (CMET) (Completed)   Symptomatic anemia    With b12 and iron deficiencies noted.  Hgb has improved with supplementation.   She is up to date on Colonoscopy and has internal hemorroids which bleed periodically.  Continue follow up with hematology  Lab Results  Component Value Date   WBC 6.1 09/03/2022   HGB 11.9 (L) 09/03/2022   HCT 37.7 09/03/2022   MCV 89.8 09/03/2022   PLT 268 09/03/2022   Lab Results  Component Value Date   IRON 77 09/03/2022   TIBC 403 09/03/2022   FERRITIN 26 09/03/2022   Lab Results  Component Value Date   [id number] 08/01/2022         I spent a total of  40 minutes with this patient in a face to face visit on the date of this encounter reviewing the last office visit with me April, most recent visit with cardiology and nephrology,  patient's diet and exercise habits, home blood pressure /blood sugar readings,  and post visit ordering of testing and therapeutics.    Follow-up: Return in about 3 months (around 01/07/2023).   [name], MD

## 2022-10-07 NOTE — Assessment & Plan Note (Signed)
elevated today due to failure to take carvedilol this mornging.  Home readings have been mostly > 130/80 per memory. She is resistant to resuming amlodipine for unclear reasons ( she recalls that I stopped it for some reason although there is no documentation of discontinuation due to adverse effects) and is intolerant of ACE INhibitors/ARB due to hyperkalemia and hctz due to acute renal failure.

## 2022-10-07 NOTE — Assessment & Plan Note (Signed)
We reviewed the reason for statin adherence and the  findings of prior CT scan today..  Patient is tolerating rosuvastatin and LDL is well below 70  . Lab Results  Component Value Date   LDLCALC 28 04/02/2022   Lab Results  Component Value Date   CHOL 110 04/02/2022   HDL 46.30 04/02/2022   LDLCALC 28 04/02/2022   LDLDIRECT 63.0 01/01/2022   TRIG 178.0 (H) 04/02/2022   CHOLHDL 2 04/02/2022

## 2022-10-07 NOTE — Assessment & Plan Note (Signed)
She has completed 4 weekly doses and is returning monthly for parenteral therapy per her preference .  She notes improved energy level  since starting therapy

## 2022-10-07 NOTE — Assessment & Plan Note (Addendum)
Her BP is not at goal on carvedilol alone.Advised to start  amlodipine 2.5 mg daily

## 2022-10-08 ENCOUNTER — Other Ambulatory Visit: Payer: Self-pay | Admitting: Internal Medicine

## 2022-10-08 MED ORDER — AMLODIPINE BESYLATE 2.5 MG PO TABS: 2.5000 mg | ORAL_TABLET | Freq: Every day | ORAL | 1 refills | Status: DC

## 2022-10-08 NOTE — Assessment & Plan Note (Addendum)
Eye exam is scheduled for today.   .  Metformin recommended but deferred by patient. 0zempic discussed but declined. She has reduced a1c to 5.8 with better diet.   Adding  2.5 mg  for BP control    Lab Results  Component Value Date   HGBA1C 5.8 10/07/2022    Lab Results  Component Value Date   LABMICR 20.3 07/04/2022   MICROALBUR 1.0 07/13/2021   MICROALBUR <0.7 12/13/2016

## 2022-10-09 DIAGNOSIS — Z0100 Encounter for examination of eyes and vision without abnormal findings: Secondary | ICD-10-CM | POA: Diagnosis not present

## 2022-10-10 ENCOUNTER — Ambulatory Visit: Payer: Medicare HMO | Admitting: Gastroenterology

## 2022-10-24 ENCOUNTER — Ambulatory Visit: Payer: Medicare HMO | Attending: Cardiovascular Disease | Admitting: Cardiovascular Disease

## 2022-10-24 ENCOUNTER — Encounter: Payer: Self-pay | Admitting: Cardiovascular Disease

## 2022-10-24 VITALS — BP 140/70 | HR 73 | Ht 59.0 in | Wt 157.2 lb

## 2022-10-24 DIAGNOSIS — E782 Mixed hyperlipidemia: Secondary | ICD-10-CM | POA: Diagnosis not present

## 2022-10-24 DIAGNOSIS — I779 Disorder of arteries and arterioles, unspecified: Secondary | ICD-10-CM

## 2022-10-24 DIAGNOSIS — I2510 Atherosclerotic heart disease of native coronary artery without angina pectoris: Secondary | ICD-10-CM

## 2022-10-24 DIAGNOSIS — I10 Essential (primary) hypertension: Secondary | ICD-10-CM | POA: Diagnosis not present

## 2022-10-24 NOTE — Patient Instructions (Signed)
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
## 2022-10-29 ENCOUNTER — Other Ambulatory Visit: Payer: Self-pay | Admitting: Internal Medicine

## 2022-10-31 ENCOUNTER — Ambulatory Visit (INDEPENDENT_AMBULATORY_CARE_PROVIDER_SITE_OTHER): Payer: Medicare HMO

## 2022-10-31 DIAGNOSIS — E538 Deficiency of other specified B group vitamins: Secondary | ICD-10-CM | POA: Diagnosis not present

## 2022-10-31 MED ORDER — CYANOCOBALAMIN 1000 MCG/ML IJ SOLN
1000.0000 ug | Freq: Once | INTRAMUSCULAR | Status: AC
  Administered 2022-10-31: 1000 ug via INTRAMUSCULAR

## 2022-10-31 NOTE — Progress Notes (Signed)
Pt presented for their vitamin B12 injection. Pt was identified through two identifiers. Pt tolerated shot well in their left  deltoid.  

## 2022-11-15 ENCOUNTER — Telehealth: Payer: Self-pay | Admitting: Internal Medicine

## 2022-11-15 MED ORDER — AMLODIPINE BESYLATE 10 MG PO TABS: 10.0000 mg | ORAL_TABLET | Freq: Every day | ORAL | 3 refills | Status: DC

## 2022-11-15 NOTE — Telephone Encounter (Signed)
Prescription Request  11/15/2022  Is this a "Controlled Substance" medicine? No  LOV: 10/07/2022  What is the name of the medication or equipment? amLODipine   Have you contacted your pharmacy to request a refill? Yes   Which pharmacy would you like this sent to?  [id number] [address], [place] AT [facility] OF [address] [place] [address] Phone: [phone] Fax: [phone]    Patient notified that their request is being sent to the clinical staff for review and that they should receive a response within 2 business days.   Please advise at [facility] [phone]

## 2022-11-15 NOTE — Telephone Encounter (Signed)
Medication has been refilled.

## 2022-12-02 ENCOUNTER — Ambulatory Visit (INDEPENDENT_AMBULATORY_CARE_PROVIDER_SITE_OTHER): Payer: Medicare HMO

## 2022-12-02 DIAGNOSIS — E538 Deficiency of other specified B group vitamins: Secondary | ICD-10-CM | POA: Diagnosis not present

## 2022-12-02 MED ORDER — CYANOCOBALAMIN 1000 MCG/ML IJ SOLN
1000.0000 ug | Freq: Once | INTRAMUSCULAR | Status: AC
  Administered 2022-12-02: 1000 ug via INTRAMUSCULAR

## 2022-12-02 NOTE — Progress Notes (Signed)
Pt presented for their vitamin B12 injection. Pt was identified through two identifiers. Pt tolerated shot well in their right deltoid.  

## 2022-12-12 ENCOUNTER — Telehealth: Payer: Self-pay | Admitting: Internal Medicine

## 2022-12-12 ENCOUNTER — Other Ambulatory Visit: Payer: Self-pay

## 2022-12-12 MED ORDER — PANTOPRAZOLE SODIUM 40 MG PO TBEC: DELAYED_RELEASE_TABLET | ORAL | 3 refills | Status: DC

## 2022-12-12 NOTE — Telephone Encounter (Signed)
sent 

## 2022-12-12 NOTE — Telephone Encounter (Signed)
Pt need a refill on pantoprazole sent to [facility]

## 2023-01-02 ENCOUNTER — Ambulatory Visit (INDEPENDENT_AMBULATORY_CARE_PROVIDER_SITE_OTHER): Payer: Medicare HMO

## 2023-01-02 DIAGNOSIS — E538 Deficiency of other specified B group vitamins: Secondary | ICD-10-CM

## 2023-01-02 MED ORDER — CYANOCOBALAMIN 1000 MCG/ML IJ SOLN
1000.0000 ug | Freq: Once | INTRAMUSCULAR | Status: AC
  Administered 2023-01-02: 1000 ug via INTRAMUSCULAR

## 2023-01-02 NOTE — Progress Notes (Addendum)
Patient arrived for her B12 injection. Patient was administered her B12 into her left deltoid. Patient tolerated the B12 injection well and did not show any signs of distress or voice any concerns.

## 2023-01-07 ENCOUNTER — Ambulatory Visit (INDEPENDENT_AMBULATORY_CARE_PROVIDER_SITE_OTHER): Payer: Medicare HMO | Admitting: Internal Medicine

## 2023-01-07 ENCOUNTER — Encounter: Payer: Self-pay | Admitting: Internal Medicine

## 2023-01-07 VITALS — BP 136/78 | HR 57 | Temp 97.9°F | Ht 59.0 in | Wt 151.2 lb

## 2023-01-07 DIAGNOSIS — Z8709 Personal history of other diseases of the respiratory system: Secondary | ICD-10-CM | POA: Diagnosis not present

## 2023-01-07 DIAGNOSIS — I130 Hypertensive heart and chronic kidney disease with heart failure and stage 1 through stage 4 chronic kidney disease, or unspecified chronic kidney disease: Secondary | ICD-10-CM | POA: Diagnosis not present

## 2023-01-07 DIAGNOSIS — I152 Hypertension secondary to endocrine disorders: Secondary | ICD-10-CM | POA: Diagnosis not present

## 2023-01-07 DIAGNOSIS — I2510 Atherosclerotic heart disease of native coronary artery without angina pectoris: Secondary | ICD-10-CM

## 2023-01-07 DIAGNOSIS — N183 Chronic kidney disease, stage 3 unspecified: Secondary | ICD-10-CM

## 2023-01-07 DIAGNOSIS — E785 Hyperlipidemia, unspecified: Secondary | ICD-10-CM | POA: Diagnosis not present

## 2023-01-07 DIAGNOSIS — M5416 Radiculopathy, lumbar region: Secondary | ICD-10-CM

## 2023-01-07 DIAGNOSIS — E1169 Type 2 diabetes mellitus with other specified complication: Secondary | ICD-10-CM

## 2023-01-07 DIAGNOSIS — D508 Other iron deficiency anemias: Secondary | ICD-10-CM | POA: Diagnosis not present

## 2023-01-07 DIAGNOSIS — E1159 Type 2 diabetes mellitus with other circulatory complications: Secondary | ICD-10-CM

## 2023-01-07 DIAGNOSIS — I5030 Unspecified diastolic (congestive) heart failure: Secondary | ICD-10-CM

## 2023-01-07 DIAGNOSIS — E669 Obesity, unspecified: Secondary | ICD-10-CM

## 2023-01-07 DIAGNOSIS — I70209 Unspecified atherosclerosis of native arteries of extremities, unspecified extremity: Secondary | ICD-10-CM | POA: Diagnosis not present

## 2023-01-07 LAB — COMPREHENSIVE METABOLIC PANEL
ALT: 11 U/L (ref 0–35)
AST: 18 U/L (ref 0–37)
Albumin: 4 g/dL (ref 3.5–5.2)
Alkaline Phosphatase: 60 U/L (ref 39–117)
BUN: 14 mg/dL (ref 6–23)
CO2: 25 mEq/L (ref 19–32)
Calcium: 9.4 mg/dL (ref 8.4–10.5)
Chloride: 109 mEq/L (ref 96–112)
Creatinine, Ser: 0.96 mg/dL (ref 0.40–1.20)
GFR: 59.58 mL/min — ABNORMAL LOW (ref >60.00)
Glucose, Bld: 89 mg/dL (ref 70–99)
Potassium: 4.8 mEq/L (ref 3.5–5.1)
Sodium: 142 mEq/L (ref 135–145)
Total Bilirubin: 0.8 mg/dL (ref 0.2–1.2)
Total Protein: 7.3 g/dL (ref 6.0–8.3)

## 2023-01-07 LAB — HEMOGLOBIN A1C: Hgb A1c MFr Bld: 5.5 % (ref 4.6–6.5)

## 2023-01-07 MED ORDER — HYDROCODONE-ACETAMINOPHEN 10-325 MG PO TABS: 1.0000 | ORAL_TABLET | Freq: Four times a day (QID) | ORAL | 0 refills | Status: DC | PRN

## 2023-01-07 MED ORDER — AMLODIPINE BESYLATE 2.5 MG PO TABS: 2.5000 mg | ORAL_TABLET | Freq: Every day | ORAL | 1 refills | Status: DC

## 2023-01-07 MED ORDER — GABAPENTIN 300 MG PO CAPS: 300.0000 mg | ORAL_CAPSULE | Freq: Three times a day (TID) | ORAL | 1 refills | Status: DC

## 2023-01-07 MED ORDER — AMLODIPINE BESYLATE 10 MG PO TABS: 10.0000 mg | ORAL_TABLET | Freq: Every day | ORAL | 3 refills | Status: DC

## 2023-01-07 NOTE — Assessment & Plan Note (Signed)
We reviewed the reason for statin adherence and the  findings of prior CT scan today..  Patient is tolerating rosuvastatin and LDL is well below 70  .   Lab Results  Component Value Date   CHOL 119 10/07/2022   HDL 46.80 10/07/2022   LDLCALC 31 07/04/2022   LDLDIRECT 36.0 10/07/2022   TRIG 246.0 (H) 10/07/2022   CHOLHDL 3 10/07/2022

## 2023-01-07 NOTE — Assessment & Plan Note (Signed)
Stable.  Advised the improved control of BP is needed,  ;  she is  avoiding   NSAIDS.  Lab Results  Component Value Date   CREATININE 0.96 01/07/2023   . Lab Results  Component Value Date   NA 142 01/07/2023   K 4.8 01/07/2023   CL 109 01/07/2023   CO2 25 01/07/2023   Lab Results  Component Value Date   LABMICR 20.3 07/04/2022   MICROALBUR 1.0 07/13/2021   MICROALBUR <0.7 12/13/2016

## 2023-01-07 NOTE — Patient Instructions (Signed)
You are doing well!  Continue  12.5 mg amlodipine and 25 mg carvedilol

## 2023-01-07 NOTE — Progress Notes (Signed)
Subjective:  Patient ID: Suzanne Cole, female    DOB: 1951/07/22  Age: 72 y.o. MRN: [id number]  CC: The primary encounter diagnosis was Hyperlipidemia LDL goal <70. 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   HPI [employer] presents for  Chief Complaint  Patient presents with   Medical Management of Chronic Issues    3 month follow up on diabetes and medication refill   1) Chronic back pain:  her pai is managed with hydrocodone taken 3 to 4 times daily.  Refill  history confirmed via [place] Controlled Substance databas, accessed by me today.. narcotics contract signed.   1) type 2 DM:  she  feels generally well, is more active and checking blood sugars once daily at variable times.  BS have been under 130 fasting and < 150 post prandially.  Denies any recent hypoglyemic events.  Taking his medications as directed. Following a carbohydrate modified diet 6 days per week. Denies numbness, burning and tingling of extremities. Appetite is good.    diet controlled . Intolerant of ACE/ARB,  taking rosuvastatin     3) HTN:  BP now < 140/90 most of time on 12.5 mg amlodipine and 25 mg carvedilol bid and she is not experiencing edema, even with a recent sprained ankle .  She has had hyperkalemia with ARB/ACE Inhibitor. Alternatives like hydralazine would required tid dosing,  she is not interested In changing meds   4) Anemia:  she reports that her energy level has significantly  improved with iron and B12 supplementation .  Being more  active outside.    5) had a prolonged bronchitis in January after husband gave it to her.  Lasted 2 weeks. Feels like it might have been RSV.  Use  a sponful of local honey for the wheezing and cough   Outpatient Medications Prior to Visit  Medication Sig Dispense Refill   albuterol (PROVENTIL) (2.5 MG/3ML) 0.083% nebulizer solution Take 3 mLs (2.5 mg total) by nebulization every 6 (six) hours as needed for wheezing or shortness of breath. 150 mL 1   albuterol (VENTOLIN HFA) 108 (90 Base) MCG/ACT inhaler INHALE 2 PUFFS BY MOUTH EVERY 6 HOURS AS NEEDED FOR WHEEZING OR SHORTNESS OF BREATH 18 g 2   aspirin 81 MG tablet Take 81 mg by mouth as needed for pain.     carvedilol (COREG) 25 MG tablet TAKE 1 TABLET(25 MG) BY MOUTH TWICE DAILY 180 tablet 3   cetirizine (ZYRTEC) 10 MG tablet Take 10 mg by mouth daily.     Cholecalciferol (VITAMIN D3) 1000 units CHEW Chew 1 tablet by mouth daily.     Ferrous Sulfate (IRON) 142 (45 Fe) MG TBCR Take 1 tablet by mouth daily. 30 tablet 1   fluticasone (FLONASE) 50 MCG/ACT nasal spray Place 2 sprays into both nostrils daily. 16 g 6   methocarbamol (ROBAXIN) 750 MG tablet TAKE 1 TABLET(750 MG) BY MOUTH FOUR TIMES DAILY 360 tablet 1   pantoprazole (PROTONIX) 40 MG tablet TAKE 1 TABLET(40 MG) BY MOUTH DAILY 90 tablet 3   rosuvastatin (CRESTOR) 40 MG tablet TAKE 1 TABLET(40 MG)  BY MOUTH DAILY 90 tablet 3   amLODipine (NORVASC) 10 MG tablet Take 1 tablet (10 mg total) by mouth at bedtime. 90 tablet 3   amLODipine (NORVASC) 2.5 MG tablet Take 1 tablet (2.5 mg total) by mouth daily. 90 tablet 1   HYDROcodone-acetaminophen (NORCO) 10-325 MG tablet Take 1 tablet by mouth every 6 (six) hours as needed. 120 tablet 0   olopatadine (PATANOL) 0.1 % ophthalmic solution Place 1 drop into both eyes 2 (two) times daily. (Patient not taking: Reported on 01/07/2023) 5 mL 1   gabapentin (NEURONTIN) 300 MG capsule Take 1 capsule (300 mg total) by mouth 3 (three) times daily. 270 capsule  1   No facility-administered medications prior to visit.    Review of Systems;  Patient denies headache, fevers, malaise, unintentional weight loss, skin rash, eye pain, sinus congestion and sinus pain, sore throat, dysphagia,  hemoptysis , cough, dyspnea, wheezing, chest pain, palpitations, orthopnea, edema, abdominal pain, nausea, melena, diarrhea, constipation, flank pain, dysuria, hematuria, urinary  Frequency, nocturia, numbness, tingling, seizures,  Focal weakness, Loss of consciousness,  Tremor, insomnia, depression, anxiety, and suicidal ideation.      Objective:  BP 136/78   Pulse (!) 57   Temp 97.9 F (36.6 C) (Oral)   Ht '4\' 11"'$  (1.499 m)   Wt 151 lb 3.2 oz (68.6 kg)   SpO2 97%   BMI 30.54 kg/m   BP Readings from Last 3 Encounters:  01/07/23 136/78  10/24/22 (!) 140/70  10/07/22 (!) 146/80    Wt Readings from Last 3 Encounters:  01/07/23 151 lb 3.2 oz (68.6 kg)  10/24/22 157 lb 4 oz (71.3 kg)  10/07/22 155 lb 12.8 oz (70.7 kg)    Physical Exam  Lab Results  Component Value Date   HGBA1C 5.5 01/07/2023   HGBA1C 5.8 10/07/2022   HGBA1C 5.6 07/04/2022    Lab Results  Component Value Date   CREATININE 0.96 01/07/2023   CREATININE 1.04 10/07/2022   CREATININE 1.03 07/04/2022    Lab Results  Component Value Date   WBC 6.1 09/03/2022   HGB 11.9 (L) 09/03/2022   HCT 37.7 09/03/2022   PLT 268 09/03/2022   GLUCOSE 89 01/07/2023   CHOL 119 10/07/2022   TRIG 246.0 (H) 10/07/2022   HDL 46.80 10/07/2022   LDLDIRECT 36.0 10/07/2022   LDLCALC 31 07/04/2022   ALT 11 01/07/2023   AST 18 01/07/2023   NA 142 01/07/2023   K 4.8 01/07/2023   CL 109 01/07/2023   CREATININE 0.96 01/07/2023   BUN 14 01/07/2023   CO2 25 01/07/2023   TSH 1.060 06/19/2022   INR 1.0 06/20/2022   HGBA1C 5.5 01/07/2023   MICROALBUR 1.0 07/13/2021    NM Myocar Multi W/Spect W/Wall Motion / EF  Result Date: 06/25/2022 Challenging study Pharmacological myocardial perfusion  imaging study with no significant  ischemia Small region fixed defect mid to distal anteroseptal wall noted, unable to exclude breast attenuation artifact Unable to visualize wall motion, ejection fraction not calculated secondary to significant GI uptake artifact No EKG changes concerning for ischemia at peak stress or in recovery. Likely low risk scan, consider alternate imaging study if clinically indicated Signed, Esmond Plants, MD, Ph.D [facility]    Assessment & Plan:  .Hyperlipidemia LDL goal <70  Obesity, diabetes, and hypertension syndrome ([place]) Assessment & Plan: Eye exam is scheduled   Metformin recommended but deferred by patient. 0zempic discussed but declined. She has reduced a1c to 5.5 with better diet.  Continue amlodipine  at 12.5 mg daily and carvedilol 25 mg bid    Lab Results  Component Value Date   HGBA1C 5.5 01/07/2023    Lab Results  Component Value Date   LABMICR 20.3 07/04/2022   MICROALBUR 1.0 07/13/2021   MICROALBUR <0.7 12/13/2016       Orders: -     Comprehensive metabolic panel -     Hemoglobin A1c  Coronary artery disease involving native coronary artery of native heart without angina pectoris Assessment & Plan: Arlina Robes very well with no anginal symptoms.  Continue aspirin 81 mg daily.   Recent Lexiscan Myoview showed no evidence of ischemia.  Most of her symptoms improved after correcting her anemia.   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  Patient is tolerating rosuvastatin and LDL is well below 70  .   Lab Results  Component Value Date   CHOL 119  10/07/2022   HDL 46.80 10/07/2022   LDLCALC 31 07/04/2022   LDLDIRECT 36.0 10/07/2022   TRIG 246.0 (H) 10/07/2022   CHOLHDL 3 10/07/2022      CKD (chronic kidney disease), symptom management only, stage 3 (moderate) (HCC) Assessment & Plan: Stable.  Advised the improved control of BP is needed,  ;  she is  avoiding   NSAIDS.  Lab Results  Component Value Date   CREATININE 0.96 01/07/2023   . Lab Results  Component Value Date   NA 142 01/07/2023   K 4.8 01/07/2023   CL 109 01/07/2023   CO2 25 01/07/2023   Lab Results  Component Value Date   LABMICR 20.3 07/04/2022   MICROALBUR 1.0 07/13/2021   MICROALBUR <0.7 12/13/2016        Other iron deficiency anemia Assessment & Plan: Hemoglobin has improved since started on oral iron supplementation. And she has more energy Recommend continue and repeat cbc and iron panel in 2-3 weeks.    Lumbar radiculitis Assessment & Plan: Secondary to DDD and scoliosis managed with vicodin and methocarbamol.  NSAIDS  C/i due to CKD   .  She has not had any ER visits  And has not requested any early refills.  Her Refill history was confirmed via [place] Controlled Substance database by me today during her visit and there have been no prescriptions of controlled substances filled from any providers other than me. .Her pain is better controlled on the current regimen and she is staying as  Active as her pain which now.  Vicodin will be refilled for September, October and November    Other orders -     Gabapentin; Take 1 capsule (300 mg total) by mouth 3 (three) times daily.  Dispense: 270 capsule; Refill: 1 -     amLODIPine Besylate; Take 1 tablet (2.5 mg total) by mouth daily. IN ADDITION TO 10 MG DOSE  Dispense: 90 tablet; Refill: 1 -     amLODIPine Besylate; Take 1 tablet (10 mg total) by mouth at bedtime. IN ADDITION TO 2.5 MG DOSE  Dispense: 90 tablet; Refill: 3 -     HYDROcodone-Acetaminophen; Take 1 tablet by mouth  every 6 (six) hours as  needed.  Dispense: 120 tablet; Refill: 0    therapeutics .   Follow-up: Return in about 3 months (around 04/07/2023) for chronic pain management.   [name], MD

## 2023-01-07 NOTE — Assessment & Plan Note (Signed)
Suzanne Cole very well with no anginal symptoms.  Continue aspirin 81 mg daily.   Recent Lexiscan Myoview showed no evidence of ischemia.  Most of her symptoms improved after correcting her anemia.

## 2023-01-07 NOTE — Assessment & Plan Note (Signed)
Eye exam is scheduled   Metformin recommended but deferred by patient. 0zempic discussed but declined. She has reduced a1c to 5.5 with better diet.    Continue amlodipine  at 12.5 mg daily and carvedilol 25 mg bid    Lab Results  Component Value Date   HGBA1C 5.5 01/07/2023    Lab Results  Component Value Date   LABMICR 20.3 07/04/2022   MICROALBUR 1.0 07/13/2021   MICROALBUR <0.7 12/13/2016

## 2023-01-07 NOTE — Assessment & Plan Note (Signed)
Secondary to DDD and scoliosis managed with vicodin and methocarbamol.  NSAIDS  C/i due to CKD   .  She has not had any ER visits  And has not requested any early refills.  Her Refill history was confirmed via [place] Controlled Substance database by me today during her visit and there have been no prescriptions of controlled substances filled from any providers other than me. .Her pain is better controlled on the current regimen and she is staying as  Active as her pain which now.  Vicodin will be refilled for September, October and November

## 2023-01-07 NOTE — Assessment & Plan Note (Signed)
Hemoglobin has improved since started on oral iron supplementation. And she has more energy Recommend continue and repeat cbc and iron panel in 2-3 weeks.

## 2023-01-07 NOTE — Assessment & Plan Note (Signed)
Her BP is not at gaol with maximal dose of amlodipine to  carvedilol

## 2023-01-12 LAB — RSV(RESPIRATORY SYNCYTIAL VIRUS) AB, BLOOD: RSV Antibodies: <1:8 {titer}

## 2023-01-17 ENCOUNTER — Inpatient Hospital Stay: Payer: Medicare HMO

## 2023-01-22 ENCOUNTER — Inpatient Hospital Stay: Payer: Medicare HMO | Admitting: Oncology

## 2023-01-30 ENCOUNTER — Ambulatory Visit: Payer: Medicare HMO

## 2023-02-03 ENCOUNTER — Ambulatory Visit: Payer: Medicare HMO

## 2023-02-03 ENCOUNTER — Ambulatory Visit (INDEPENDENT_AMBULATORY_CARE_PROVIDER_SITE_OTHER): Payer: Medicare HMO

## 2023-02-03 DIAGNOSIS — E538 Deficiency of other specified B group vitamins: Secondary | ICD-10-CM

## 2023-02-03 MED ORDER — CYANOCOBALAMIN 1000 MCG/ML IJ SOLN
1000.0000 ug | Freq: Once | INTRAMUSCULAR | Status: AC
  Administered 2023-02-03: 1000 ug via INTRAMUSCULAR

## 2023-02-03 NOTE — Progress Notes (Signed)
presents today for injection per MD orders. B12 injection administered IM in right Upper Arm. Administration without incident. Patient tolerated well.  Cutter Passey,cma  

## 2023-03-10 ENCOUNTER — Ambulatory Visit (INDEPENDENT_AMBULATORY_CARE_PROVIDER_SITE_OTHER): Payer: Medicare HMO

## 2023-03-10 DIAGNOSIS — E538 Deficiency of other specified B group vitamins: Secondary | ICD-10-CM | POA: Diagnosis not present

## 2023-03-10 MED ORDER — CYANOCOBALAMIN 1000 MCG/ML IJ SOLN
1000.0000 ug | Freq: Once | INTRAMUSCULAR | Status: AC
  Administered 2023-03-10: 1000 ug via INTRAMUSCULAR

## 2023-03-10 NOTE — Progress Notes (Signed)
Patient arrived for a B12 injection and it was administered into her left deltoid. Patient tolerated the injection well and did not show any signs of distress or voice any concerns. 

## 2023-04-09 ENCOUNTER — Encounter: Payer: Self-pay | Admitting: Internal Medicine

## 2023-04-09 ENCOUNTER — Ambulatory Visit (INDEPENDENT_AMBULATORY_CARE_PROVIDER_SITE_OTHER): Payer: Medicare HMO | Admitting: Internal Medicine

## 2023-04-09 VITALS — BP 130/78 | HR 59 | Temp 97.8°F | Ht 59.0 in | Wt 144.6 lb

## 2023-04-09 DIAGNOSIS — M5416 Radiculopathy, lumbar region: Secondary | ICD-10-CM

## 2023-04-09 DIAGNOSIS — Z1231 Encounter for screening mammogram for malignant neoplasm of breast: Secondary | ICD-10-CM

## 2023-04-09 DIAGNOSIS — I152 Hypertension secondary to endocrine disorders: Secondary | ICD-10-CM

## 2023-04-09 DIAGNOSIS — I700 Atherosclerosis of aorta: Secondary | ICD-10-CM | POA: Diagnosis not present

## 2023-04-09 DIAGNOSIS — E1159 Type 2 diabetes mellitus with other circulatory complications: Secondary | ICD-10-CM

## 2023-04-09 DIAGNOSIS — E538 Deficiency of other specified B group vitamins: Secondary | ICD-10-CM

## 2023-04-09 DIAGNOSIS — E669 Obesity, unspecified: Secondary | ICD-10-CM | POA: Diagnosis not present

## 2023-04-09 DIAGNOSIS — E1169 Type 2 diabetes mellitus with other specified complication: Secondary | ICD-10-CM

## 2023-04-09 MED ORDER — HYDROCODONE-ACETAMINOPHEN 10-325 MG PO TABS: 1.0000 | ORAL_TABLET | Freq: Four times a day (QID) | ORAL | 0 refills | Status: DC | PRN

## 2023-04-09 MED ORDER — CYANOCOBALAMIN 1000 MCG/ML IJ SOLN
1000.0000 ug | Freq: Once | INTRAMUSCULAR | Status: AC
  Administered 2023-04-09: 1000 ug via INTRAMUSCULAR

## 2023-04-09 NOTE — Progress Notes (Signed)
Subjective:  Patient ID: Suzanne Cole, female    DOB: 1951/05/31  Age: 72 y.o. MRN: [id number]  CC: The primary encounter diagnosis was Thoracic aortic atherosclerosis (HCC). Diagnoses of Encounter for screening mammogram for malignant neoplasm of breast, B12 deficiency, Obesity, diabetes, and hypertension syndrome (HCC), and Lumbar radiculitis were also pertinent to this visit.   HPI Suzanne Cole presents for  Chief Complaint  Patient presents with   Medical Management of Chronic Issues    3 month follow up     Jonella is feeling great .  She has recently returned from a trip to [place] , where she Celebrated husband's retirement with a trip to [place] and met  relatives from [place] who were related to her mother who died when she was  8   Obesity, HTN , DM :  she has been following a low GI diet and losing weight intentionally.   Working in her yard and walking for exercise.  Taking carvedilol and amlodipine (12.5 mg daily) due  to history of hyperkalemia with ACE inhibitor.  .  Taking rosuvastatin 40 mg daily  Chronic pain:  secondary to severe scoliosis.  Refill history confirmed via [place] Controlled Substance databas, accessed by me today.. using her current regimen she is able to lead a fairly normal life.    Thoracic Aortic atherosclerosis:   Patient is tolerating high potency statin therapy  for risk reduction and  stabilization of atherosclerotic placque noted on prior abdominal CT which was reviewed during today's visit       Outpatient Medications Prior to Visit  Medication Sig Dispense Refill   albuterol (PROVENTIL) (2.5 MG/3ML) 0.083% nebulizer solution Take 3 mLs (2.5 mg total) by nebulization every 6 (six) hours as needed for wheezing or shortness of breath. 150 mL 1   albuterol (VENTOLIN HFA) 108 (90 Base) MCG/ACT inhaler INHALE 2 PUFFS BY MOUTH EVERY 6 HOURS AS NEEDED FOR WHEEZING OR SHORTNESS OF BREATH 18 g 2   amLODipine (NORVASC) 10 MG tablet Take 1 tablet (10 mg  total) by mouth at bedtime. IN ADDITION TO 2.5 MG DOSE 90 tablet 3   amLODipine (NORVASC) 2.5 MG tablet Take 1 tablet (2.5 mg total) by mouth daily. IN ADDITION TO 10 MG DOSE 90 tablet 1   aspirin 81 MG tablet Take 81 mg by mouth as needed for pain.     carvedilol (COREG) 25 MG tablet TAKE 1 TABLET(25 MG) BY MOUTH TWICE DAILY 180 tablet 3   cetirizine (ZYRTEC) 10 MG tablet Take 10 mg by mouth daily.     Cholecalciferol (VITAMIN D3) 1000 units CHEW Chew 1 tablet by mouth daily.     Ferrous Sulfate (IRON) 142 (45 Fe) MG TBCR Take 1 tablet by mouth daily. 30 tablet 1   fluticasone (FLONASE) 50 MCG/ACT nasal spray Place 2 sprays into both nostrils daily. 16 g 6   gabapentin (NEURONTIN) 300 MG capsule Take 1 capsule (300 mg total) by mouth 3 (three) times daily. 270 capsule 1   methocarbamol (ROBAXIN) 750 MG tablet TAKE 1 TABLET(750 MG) BY MOUTH FOUR TIMES DAILY 360 tablet 1   pantoprazole (PROTONIX) 40 MG tablet TAKE 1 TABLET(40 MG) BY MOUTH DAILY 90 tablet 3   rosuvastatin (CRESTOR) 40 MG tablet TAKE 1 TABLET(40 MG) BY MOUTH DAILY 90 tablet 3   HYDROcodone-acetaminophen (NORCO) 10-325 MG tablet Take 1 tablet by mouth every 6 (six) hours as needed. 120 tablet 0   olopatadine (PATANOL) 0.1 % ophthalmic solution Place 1  drop into both eyes 2 (two) times daily. (Patient not taking: Reported on 01/07/2023) 5 mL 1   No facility-administered medications prior to visit.    Review of Systems;  Patient denies headache, fevers, malaise, unintentional weight loss, skin rash, eye pain, sinus congestion and sinus pain, sore throat, dysphagia,  hemoptysis , cough, dyspnea, wheezing, chest pain, palpitations, orthopnea, edema, abdominal pain, nausea, melena, diarrhea, constipation, flank pain, dysuria, hematuria, urinary  Frequency, nocturia, numbness, tingling, seizures,  Focal weakness, Loss of consciousness,  Tremor, insomnia, depression, anxiety, and suicidal ideation.      Objective:  BP 130/78   Pulse  (!) 59   Temp 97.8 F (36.6 C) (Oral)   Ht 4\' 11"  (1.499 m)   Wt 144 lb 9.6 oz (65.6 kg)   SpO2 96%   BMI 29.21 kg/m   BP Readings from Last 3 Encounters:  04/09/23 130/78  01/07/23 136/78  10/24/22 (!) 140/70    Wt Readings from Last 3 Encounters:  04/09/23 144 lb 9.6 oz (65.6 kg)  01/07/23 151 lb 3.2 oz (68.6 kg)  10/24/22 157 lb 4 oz (71.3 kg)    Physical Exam Vitals reviewed.  Constitutional:      General: She is not in acute distress.    Appearance: Normal appearance. She is normal weight. She is not ill-appearing, toxic-appearing or diaphoretic.  HENT:     Head: Normocephalic.  Eyes:     General: No scleral icterus.       Right eye: No discharge.        Left eye: No discharge.     Conjunctiva/sclera: Conjunctivae normal.  Cardiovascular:     Rate and Rhythm: Normal rate and regular rhythm.     Heart sounds: Normal heart sounds.  Pulmonary:     Effort: Pulmonary effort is normal. No respiratory distress.     Breath sounds: Normal breath sounds.  Musculoskeletal:        General: Normal range of motion.  Skin:    General: Skin is warm and dry.  Neurological:     General: No focal deficit present.     Mental Status: She is alert and oriented to person, place, and time. Mental status is at baseline.  Psychiatric:        Mood and Affect: Mood normal.        Behavior: Behavior normal.        Thought Content: Thought content normal.        Judgment: Judgment normal.    Lab Results  Component Value Date   HGBA1C 5.5 01/07/2023   HGBA1C 5.8 10/07/2022   HGBA1C 5.6 07/04/2022    Lab Results  Component Value Date   CREATININE 0.96 01/07/2023   CREATININE 1.04 10/07/2022   CREATININE 1.03 07/04/2022    Lab Results  Component Value Date   WBC 6.1 09/03/2022   HGB 11.9 (L) 09/03/2022   HCT 37.7 09/03/2022   PLT 268 09/03/2022   GLUCOSE 89 01/07/2023   CHOL 119 10/07/2022   TRIG 246.0 (H) 10/07/2022   HDL 46.80 10/07/2022   LDLDIRECT 36.0 10/07/2022    LDLCALC 31 07/04/2022   ALT 11 01/07/2023   AST 18 01/07/2023   NA 142 01/07/2023   K 4.8 01/07/2023   CL 109 01/07/2023   CREATININE 0.96 01/07/2023   BUN 14 01/07/2023   CO2 25 01/07/2023   TSH 1.060 06/19/2022   INR 1.0 06/20/2022   HGBA1C 5.5 01/07/2023   MICROALBUR 1.0 07/13/2021    NM  Myocar Multi W/Spect W/Wall Motion / EF  Result Date: 06/25/2022 Challenging study Pharmacological myocardial perfusion imaging study with no significant  ischemia Small region fixed defect mid to distal anteroseptal wall noted, unable to exclude breast attenuation artifact Unable to visualize wall motion, ejection fraction not calculated secondary to significant GI uptake artifact No EKG changes concerning for ischemia at peak stress or in recovery. Likely low risk scan, consider alternate imaging study if clinically indicated Signed, Dossie Arbour, MD, Ph.D [facility]    Assessment & Plan:  .Thoracic aortic atherosclerosis [facility]) Assessment & Plan: Reviewed findings of prior CT scan today..  Patient is tolerating high potency statin therapy and LDL is < 70     Encounter for screening mammogram for malignant neoplasm of breast -     3D Screening Mammogram, Left and Right; Future  B12 deficiency -     Cyanocobalamin  Obesity, diabetes, and hypertension syndrome (HCC) Assessment & Plan: Eye exam is scheduled   Metformin recommended but deferred by patient. 0zempic discussed but declined. She has reduced a1c to 5.5 with better diet.    Continue amlodipine  at 12.5 mg daily and carvedilol 25 mg bid    Lab Results  Component Value Date   HGBA1C 5.5 01/07/2023    Lab Results  Component Value Date   LABMICR 20.3 07/04/2022   MICROALBUR 1.0 07/13/2021   MICROALBUR <0.7 12/13/2016        Lumbar radiculitis Assessment & Plan: Secondary to DDD and scoliosis .  Her pain is managed with vicodin and methocarbamol.  NSAIDS  C/i due to CKD   .  She has not had any ER visits  And has  not requested any early refills.  Her Refill history was confirmed via [place] Controlled Substance database by me today during her visit and there have been no prescriptions of controlled substances filled from any providers other than me. .Her pain is better controlled on the current regimen and she is staying as  Active as her pain which now.  Vicodin will be refilled for June .July and August    Other orders -     HYDROcodone-Acetaminophen; Take 1 tablet by mouth every 6 (six) hours as needed.  Dispense: 120 tablet; Refill: 0 -     HYDROcodone-Acetaminophen; Take 1 tablet by mouth every 6 (six) hours as needed.  Dispense: 120 tablet; Refill: 0 -     HYDROcodone-Acetaminophen; Take 1 tablet by mouth every 6 (six) hours as needed.  Dispense: 120 tablet; Refill: 0   .   Follow-up: Return in about 3 months (around 07/10/2023) for follow up diabetes.   Sherlene Shams, MD

## 2023-04-09 NOTE — Assessment & Plan Note (Signed)
Eye exam is scheduled   Metformin recommended but deferred by patient. 0zempic discussed but declined. She has reduced a1c to 5.5 with better diet.    Continue amlodipine  at 12.5 mg daily and carvedilol 25 mg bid    Lab Results  Component Value Date   HGBA1C 5.5 01/07/2023    Lab Results  Component Value Date   LABMICR 20.3 07/04/2022   MICROALBUR 1.0 07/13/2021   MICROALBUR <0.7 12/13/2016      

## 2023-04-09 NOTE — Patient Instructions (Addendum)
YOUR MAMMOGRAM IS DUE, Fuller Song [facility] at [phone] AND GET THIS SCHEDULED!  Please check your BP a few times at home and send me the readings  We will repeat your labs at your next visit

## 2023-04-13 NOTE — Assessment & Plan Note (Addendum)
Secondary to DDD and scoliosis .  Her pain is managed with vicodin and methocarbamol.  NSAIDS  C/i due to CKD   .  She has not had any ER visits  And has not requested any early refills.  Her Refill history was confirmed via [place] Controlled Substance database by me today during her visit and there have been no prescriptions of controlled substances filled from any providers other than me. .Her pain is better controlled on the current regimen and she is staying as  Active as her pain which now.  Vicodin will be refilled for June .July and August

## 2023-04-13 NOTE — Assessment & Plan Note (Signed)
Reviewed findings of prior CT scan today..  Patient is tolerating high potency statin therapy and LDL is < 70   

## 2023-04-14 DIAGNOSIS — Z1231 Encounter for screening mammogram for malignant neoplasm of breast: Secondary | ICD-10-CM | POA: Diagnosis not present

## 2023-04-14 LAB — HM MAMMOGRAPHY

## 2023-04-15 ENCOUNTER — Encounter: Payer: Self-pay | Admitting: Internal Medicine

## 2023-05-08 ENCOUNTER — Ambulatory Visit (INDEPENDENT_AMBULATORY_CARE_PROVIDER_SITE_OTHER): Payer: Medicare HMO | Admitting: *Deleted

## 2023-05-08 DIAGNOSIS — E538 Deficiency of other specified B group vitamins: Secondary | ICD-10-CM | POA: Diagnosis not present

## 2023-05-08 MED ORDER — CYANOCOBALAMIN 1000 MCG/ML IJ SOLN
1000.0000 ug | Freq: Once | INTRAMUSCULAR | Status: AC
  Administered 2023-05-08: 1000 ug via INTRAMUSCULAR

## 2023-05-08 NOTE — Progress Notes (Signed)
Pt received B12 injection in Left  deltoid muscle. Pt tolerated it well with no complaints or concerns.

## 2023-05-09 ENCOUNTER — Ambulatory Visit: Payer: Medicare HMO | Admitting: Cardiovascular Disease

## 2023-06-05 ENCOUNTER — Other Ambulatory Visit: Payer: Self-pay | Admitting: Internal Medicine

## 2023-06-09 ENCOUNTER — Ambulatory Visit (INDEPENDENT_AMBULATORY_CARE_PROVIDER_SITE_OTHER): Payer: Medicare HMO

## 2023-06-09 DIAGNOSIS — E538 Deficiency of other specified B group vitamins: Secondary | ICD-10-CM | POA: Diagnosis not present

## 2023-06-09 MED ORDER — CYANOCOBALAMIN 1000 MCG/ML IJ SOLN
1000.0000 ug | Freq: Once | INTRAMUSCULAR | Status: AC
  Administered 2023-06-09: 1000 ug via INTRAMUSCULAR

## 2023-06-09 NOTE — Progress Notes (Signed)
Patient arrived for a B12 injection and it was administered into her right deltoid. Patient tolerated the injection well and did not show any signs of distress or voice any concerns. 

## 2023-06-11 ENCOUNTER — Encounter (INDEPENDENT_AMBULATORY_CARE_PROVIDER_SITE_OTHER): Payer: Self-pay

## 2023-06-27 ENCOUNTER — Ambulatory Visit: Payer: Self-pay

## 2023-06-27 NOTE — Patient Outreach (Signed)
  Care Coordination   Initial Visit Note   06/27/2023 Name: Suzanne Cole MRN: [id number] DOB: 1951-03-13  Suzanne Cole is a 72 y.o. year old female who sees Darrick Huntsman, Mar Daring, MD for primary care. I spoke with  Suzanne Cole by phone today.  What matters to the patients health and wellness today?  Patient requests a call back on another day to discuss [facility].    Goals Addressed   None     SDOH assessments and interventions completed:  No     Care Coordination Interventions:  No, not indicated   Follow up plan:  Call another day.    Encounter Outcome:  Pt. Visit Completed

## 2023-07-07 ENCOUNTER — Other Ambulatory Visit: Payer: Self-pay | Admitting: Internal Medicine

## 2023-07-07 NOTE — Telephone Encounter (Signed)
Refilled: 01/07/2023 Last OV: 04/09/2023 Next OV: 07/10/2023

## 2023-07-08 ENCOUNTER — Other Ambulatory Visit: Payer: Self-pay

## 2023-07-08 MED ORDER — ROSUVASTATIN CALCIUM 40 MG PO TABS: ORAL_TABLET | ORAL | 3 refills | Status: DC

## 2023-07-10 ENCOUNTER — Ambulatory Visit (INDEPENDENT_AMBULATORY_CARE_PROVIDER_SITE_OTHER): Payer: Medicare HMO | Admitting: Internal Medicine

## 2023-07-10 ENCOUNTER — Encounter: Payer: Self-pay | Admitting: Internal Medicine

## 2023-07-10 VITALS — BP 132/74 | HR 65 | Ht 59.0 in | Wt 138.6 lb

## 2023-07-10 DIAGNOSIS — I130 Hypertensive heart and chronic kidney disease with heart failure and stage 1 through stage 4 chronic kidney disease, or unspecified chronic kidney disease: Secondary | ICD-10-CM

## 2023-07-10 DIAGNOSIS — E1159 Type 2 diabetes mellitus with other circulatory complications: Secondary | ICD-10-CM | POA: Diagnosis not present

## 2023-07-10 DIAGNOSIS — N183 Chronic kidney disease, stage 3 unspecified: Secondary | ICD-10-CM

## 2023-07-10 DIAGNOSIS — I2510 Atherosclerotic heart disease of native coronary artery without angina pectoris: Secondary | ICD-10-CM

## 2023-07-10 DIAGNOSIS — R221 Localized swelling, mass and lump, neck: Secondary | ICD-10-CM

## 2023-07-10 DIAGNOSIS — I152 Hypertension secondary to endocrine disorders: Secondary | ICD-10-CM

## 2023-07-10 DIAGNOSIS — E538 Deficiency of other specified B group vitamins: Secondary | ICD-10-CM

## 2023-07-10 DIAGNOSIS — Z862 Personal history of diseases of the blood and blood-forming organs and certain disorders involving the immune mechanism: Secondary | ICD-10-CM

## 2023-07-10 DIAGNOSIS — E785 Hyperlipidemia, unspecified: Secondary | ICD-10-CM

## 2023-07-10 DIAGNOSIS — E1169 Type 2 diabetes mellitus with other specified complication: Secondary | ICD-10-CM | POA: Diagnosis not present

## 2023-07-10 DIAGNOSIS — N281 Cyst of kidney, acquired: Secondary | ICD-10-CM

## 2023-07-10 DIAGNOSIS — I5030 Unspecified diastolic (congestive) heart failure: Secondary | ICD-10-CM

## 2023-07-10 DIAGNOSIS — E669 Obesity, unspecified: Secondary | ICD-10-CM | POA: Diagnosis not present

## 2023-07-10 DIAGNOSIS — M5416 Radiculopathy, lumbar region: Secondary | ICD-10-CM

## 2023-07-10 LAB — COMPREHENSIVE METABOLIC PANEL
ALT: 8 U/L (ref 0–35)
AST: 15 U/L (ref 0–37)
Albumin: 4.1 g/dL (ref 3.5–5.2)
Alkaline Phosphatase: 50 U/L (ref 39–117)
BUN: 13 mg/dL (ref 6–23)
CO2: 27 mEq/L (ref 19–32)
Calcium: 9.4 mg/dL (ref 8.4–10.5)
Chloride: 105 mEq/L (ref 96–112)
Creatinine, Ser: 1.04 mg/dL (ref 0.40–1.20)
GFR: 53.93 mL/min — ABNORMAL LOW (ref >60.00)
Glucose, Bld: 97 mg/dL (ref 70–99)
Potassium: 4.3 mEq/L (ref 3.5–5.1)
Sodium: 139 mEq/L (ref 135–145)
Total Bilirubin: 1.1 mg/dL (ref 0.2–1.2)
Total Protein: 7.4 g/dL (ref 6.0–8.3)

## 2023-07-10 LAB — LIPID PANEL
Cholesterol: 119 mg/dL (ref 0–200)
HDL: 44 mg/dL (ref >39.00)
NonHDL: 74.68
Total CHOL/HDL Ratio: 3
Triglycerides: 245 mg/dL — ABNORMAL HIGH (ref 0.0–149.0)
VLDL: 49 mg/dL — ABNORMAL HIGH (ref 0.0–40.0)

## 2023-07-10 LAB — LDL CHOLESTEROL, DIRECT: Direct LDL: 48 mg/dL

## 2023-07-10 LAB — MICROALBUMIN / CREATININE URINE RATIO
Creatinine,U: 128.8 mg/dL
Microalb Creat Ratio: 1.1 mg/g (ref 0.0–30.0)
Microalb, Ur: 1.4 mg/dL (ref 0.0–1.9)

## 2023-07-10 LAB — HEMOGLOBIN A1C: Hgb A1c MFr Bld: 5.6 % (ref 4.6–6.5)

## 2023-07-10 MED ORDER — AMLODIPINE BESYLATE 2.5 MG PO TABS: 2.5000 mg | ORAL_TABLET | Freq: Every day | ORAL | 1 refills | Status: DC

## 2023-07-10 MED ORDER — CYANOCOBALAMIN 1000 MCG/ML IJ SOLN
1000.0000 ug | Freq: Once | INTRAMUSCULAR | Status: AC
Start: 2023-07-10 — End: 2023-07-10
  Administered 2023-07-10: 1000 ug via INTRAMUSCULAR

## 2023-07-10 MED ORDER — HYDROCODONE-ACETAMINOPHEN 10-325 MG PO TABS: 1.0000 | ORAL_TABLET | Freq: Four times a day (QID) | ORAL | 0 refills | Status: DC | PRN

## 2023-07-10 MED ORDER — PREDNISONE 10 MG PO TABS: ORAL_TABLET | ORAL | 0 refills | Status: DC

## 2023-07-10 NOTE — Progress Notes (Signed)
Subjective:  Patient ID: Suzanne Cole, female    DOB: June 13, 1951  Age: 72 y.o. MRN: [id number]  CC: The primary encounter diagnosis was Hyperlipidemia LDL goal <70. 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   HPI Suzanne Cole presents for  Chief Complaint  Patient presents with   Medical Management of Chronic Issues   1) Type 2 DM:  She  feels generally well,  is swimming regularly (with her dogs in her home pool) and losing  weight. She has lost 20 lbs since December.  Checking  blood sugars less than once daily at variable times, usually only if she feels she may be having a hypoglycemic event. .  BS have been under 130 fasting and < 150 post prandially.  Denies any recent hypoglyemic events.  Taking   medications as directed. Following a carbohydrate modified diet 6 days per week. Denies numbness, burning and tingling of extremities. Appetite is good.     2) Chronic pain :  using vicodin  for management of chronic back pain secondary to severe acquired scoliosis and  CKD . [name] She has not had any ER visits  And has not requested any early refills.  Her Refill history was confirmed via [place] Controlled Substance database by me today during her visit and there have been no prescriptions of controlled substances filled from any providers other than me. [name]    3) right sided neck mass:  present since 2015 .quite enlarged today.  Reviewed prior workup in 2015: CT neck was done ,  she was referred to ENT ,  Dr Willeen Cass did an FNA  which was " of limited diagnostic utility ."    Excisional  biopsy was recommended but deferred by patient .  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  Also using a massage recliner which she enjoys   5) HTN:  taking max doses of carvedilol (25 mg bid) and 12.5 mg amlodipine without edema or hypotension   Outpatient Medications Prior to Visit  Medication Sig Dispense Refill   albuterol (PROVENTIL) (2.5 MG/3ML) 0.083% nebulizer solution Take 3 mLs (2.5 mg total) by nebulization every 6 (six) hours as needed for wheezing or shortness of breath. 150 mL 1   albuterol (VENTOLIN HFA) 108 (90 Base) MCG/ACT inhaler INHALE 2 PUFFS BY MOUTH EVERY 6 HOURS AS NEEDED FOR WHEEZING OR SHORTNESS OF BREATH 18 g 2   amLODipine (NORVASC) 10 MG tablet Take 1 tablet (10 mg total) by mouth at bedtime. IN ADDITION TO 2.5 MG DOSE 90 tablet 3   aspirin 81 MG tablet Take 81 mg by mouth as needed for pain.     carvedilol (COREG) 25 MG tablet TAKE 1 TABLET(25 MG) BY MOUTH TWICE DAILY 180 tablet 3   cetirizine (ZYRTEC) 10 MG tablet Take 10 mg by mouth daily.     Cholecalciferol (VITAMIN D3) 1000 units CHEW Chew 1 tablet by mouth daily.     Ferrous Sulfate (IRON) 142 (45 Fe)  MG TBCR Take 1 tablet by mouth daily. 30 tablet 1   fluticasone (FLONASE) 50 MCG/ACT nasal spray Place 2 sprays into both nostrils daily. 16 g 6   gabapentin (NEURONTIN) 300 MG capsule TAKE 1 CAPSULE(300 MG) BY MOUTH THREE TIMES DAILY 270 capsule 1   methocarbamol (ROBAXIN) 750 MG tablet TAKE 1 TABLET(750 MG) BY MOUTH FOUR TIMES DAILY 360 tablet 1   pantoprazole (PROTONIX) 40 MG tablet TAKE 1 TABLET(40 MG) BY MOUTH DAILY 90 tablet 3   rosuvastatin (CRESTOR) 40 MG tablet TAKE 1 TABLET(40 MG) BY MOUTH DAILY 90 tablet 3   HYDROcodone-acetaminophen (LORTAB) 10-500 MG tablet Take 1 tablet by mouth every 6 (six) hours as needed for pain (maxium 4 daily ). 124 tablet 3    HYDROcodone-acetaminophen (NORCO) 10-325 MG tablet Take 1 tablet by mouth every 6 (six) hours as needed. 120 tablet 0   HYDROcodone-acetaminophen (LORTAB) 10-500 MG per tablet Take 1 tablet by mouth every 6 (six) hours as needed for pain (maxium 4 daily ). 124 tablet 3   HYDROcodone-acetaminophen (NORCO) 10-325 MG tablet Take 1 tablet by mouth every 6 (six) hours as needed. 120 tablet 0   No facility-administered medications prior to visit.    Review of Systems;  Patient denies headache, fevers, malaise, unintentional weight loss, skin rash, eye pain, sinus congestion and sinus pain, sore throat, dysphagia,  hemoptysis , cough, dyspnea, wheezing, chest pain, palpitations, orthopnea, edema, abdominal pain, nausea, melena, diarrhea, constipation, flank pain, dysuria, hematuria, urinary  Frequency, nocturia, numbness, tingling, seizures,  Focal weakness, Loss of consciousness,  Tremor, insomnia, depression, anxiety, and suicidal ideation.      Objective:  BP 132/74   Pulse 65   Ht 4\' 11"  (1.499 m)   Wt 138 lb 9.6 oz (62.9 kg)   SpO2 96%   BMI 27.99 kg/m   BP Readings from Last 3 Encounters:  07/10/23 132/74  04/09/23 130/78  01/07/23 136/78    Wt Readings from Last 3 Encounters:  07/10/23 138 lb 9.6 oz (62.9 kg)  04/09/23 144 lb 9.6 oz (65.6 kg)  01/07/23 151 lb 3.2 oz (68.6 kg)    Physical Exam Vitals reviewed.  Constitutional:      General: She is not in acute distress.    Appearance: Normal appearance. She is normal weight. She is not ill-appearing, toxic-appearing or diaphoretic.  HENT:     Head: Normocephalic.  Eyes:     General: No scleral icterus.       Right eye: No discharge.        Left eye: No discharge.     Conjunctiva/sclera: Conjunctivae normal.  Neck:     Vascular: No carotid bruit.      Comments: Cherry tomato sized asymptomatic mass in superficial cervical chain , non pulsatile Cardiovascular:     Rate and Rhythm: Normal rate and regular rhythm.      Heart sounds: Normal heart sounds.  Pulmonary:     Effort: Pulmonary effort is normal. No respiratory distress.     Breath sounds: Normal breath sounds.  Musculoskeletal:        General: Normal range of motion.  Lymphadenopathy:     Cervical: Cervical adenopathy present.     Right cervical: Superficial cervical adenopathy present.  Skin:    General: Skin is warm and dry.  Neurological:     General: No focal deficit present.     Mental Status: She is alert and oriented to person, place, and time. Mental status is at baseline.  Psychiatric:        Mood and Affect: Mood normal.        Behavior: Behavior normal.        Thought Content: Thought content normal.        Judgment: Judgment normal.    Lab Results  Component Value Date   HGBA1C 5.6 07/10/2023   HGBA1C 5.5 01/07/2023   HGBA1C 5.8 10/07/2022    Lab Results  Component Value Date   CREATININE 1.04 07/10/2023   CREATININE 0.96 01/07/2023   CREATININE 1.04 10/07/2022    Lab Results  Component Value Date   WBC 6.1 09/03/2022   HGB 11.9 (L) 09/03/2022   HCT 37.7 09/03/2022   PLT 268 09/03/2022   GLUCOSE 97 07/10/2023   CHOL 119 07/10/2023   TRIG 245.0 (H) 07/10/2023   HDL 44.00 07/10/2023   LDLDIRECT 48.0 07/10/2023   LDLCALC 31 07/04/2022   ALT 8 07/10/2023   AST 15 07/10/2023   NA 139 07/10/2023   K 4.3 07/10/2023   CL 105 07/10/2023   CREATININE 1.04 07/10/2023   BUN 13 07/10/2023   CO2 27 07/10/2023   TSH 1.060 06/19/2022   INR 1.0 06/20/2022   HGBA1C 5.6 07/10/2023   MICROALBUR 1.4 07/10/2023    NM Myocar Multi W/Spect W/Wall Motion / EF  Result Date: 06/25/2022 Challenging study Pharmacological myocardial perfusion imaging study with no significant  ischemia Small region fixed defect mid to distal anteroseptal wall noted, unable to exclude breast attenuation artifact Unable to visualize wall motion, ejection fraction not calculated secondary to significant GI uptake artifact No EKG changes concerning  for ischemia at peak stress or in recovery. Likely low risk scan, consider alternate imaging study if clinically indicated Signed, Dossie Arbour, MD, Ph.D [facility]    Assessment & Plan:  .Hyperlipidemia LDL goal <70 -     Lipid panel -     LDL cholesterol, direct  Obesity, diabetes, and hypertension syndrome (HCC) Assessment & Plan: Eye exam is scheduled   Metformin recommended but deferred by patient. 0zempic discussed but declined. She has rmaintained an A1c  6.0 and lost weight with low GI diet and exercise. Foot exam normal today.  ACE/ARB C/I.   Continue amlodipine  at 12.5 mg daily and carvedilol 25 mg bid    Lab Results  Component Value Date   HGBA1C 5.6 07/10/2023    Lab Results  Component Value Date   LABMICR 20.3 07/04/2022   MICROALBUR 1.4 07/10/2023   MICROALBUR 1.0 07/13/2021       Orders: -     Microalbumin / creatinine urine ratio -     Hemoglobin A1c -     Comprehensive metabolic panel  Renal cyst, acquired, left  Neck mass Assessment & Plan: She continues to defer workup , including repeat imaging   Orders: -     US SOFT TISSUE HEAD & NECK (NON-THYROID); Future  B12 deficiency -     Cyanocobalamin  History of anemia Assessment & Plan: With b12 and iron deficiencies noted.  Anemia has resolved with supplementation.   She is up to date on Colonoscopy and has internal hemorroids which bleed periodically.  Continue follow up with hematology  Lab Results  Component Value Date   WBC 6.1 09/03/2022   HGB 11.9 (L) 09/03/2022   HCT 37.7 09/03/2022   MCV 89.8 09/03/2022   PLT 268 09/03/2022   Lab Results  Component Value Date   IRON 77 09/03/2022   TIBC 403 09/03/2022  FERRITIN 26 09/03/2022   Lab Results  Component Value Date   VITAMINB12 580 08/01/2022      CKD (chronic kidney disease), symptom management only, stage 3 (moderate) (HCC) Assessment & Plan: Stable.  Continue aggressive conrol of hypertension and avoidance of  NSAIDS.   ACE/ARB are C/I  Lab Results  Component Value Date   CREATININE 1.04 07/10/2023   . 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   Continue aspirin 81 mg daily. Crestor 40 mg and carvedilol.     Lab Results  Component Value Date   CHOL 119 07/10/2023   HDL 44.00 07/10/2023   LDLCALC 31 07/04/2022   LDLDIRECT 48.0 07/10/2023   TRIG 245.0 (H) 07/10/2023   CHOLHDL 3 07/10/2023      Lumbar radiculitis Assessment & Plan: Secondary to DDD and scoliosis .  Her pain is managed with vicodin and methocarbamol.  NSAIDS  C/i due to CKD   .  She has not had any ER visits  And has not requested any early refills.  Her Refill history was confirmed via [place] Controlled Substance database by me today during her visit and there have been no prescriptions of controlled substances filled from any providers other than me. .Her pain is better controlled on the current regimen and she is staying as  Active as her pain which now.  Vicodin will be refilled for 3 months    Other orders -     amLODIPine Besylate; Take 1 tablet (2.5 mg total) by mouth daily. IN ADDITION TO 10 MG DOSE  Dispense: 90 tablet; Refill: 1 -     predniSONE; 6 tablets on Day 1 , then reduce by 1 tablet daily until gone  Dispense: 21 tablet; Refill: 0 -     HYDROcodone-Acetaminophen; Take 1 tablet by mouth every 6 (six) hours as needed.  Dispense: 120 tablet; Refill: 0 -     HYDROcodone-Acetaminophen; Take  1 tablet by mouth every 6 (six) hours as needed.  Dispense: 120 tablet; Refill: 0 -     HYDROcodone-Acetaminophen; Take 1 tablet by mouth every 6 (six) hours as needed.  Dispense: 120 tablet; Refill: 0     I provided  40 minutes on the day of this face-to-face  encounter reviewing patient's last visit with me, patient's  most recent visit with cardiology,  prior  surgical and non surgical procedures, previous  labs and imaging studies, counseling on currently addressed issues,  and post visit ordering to diagnostics and therapeutics .    Follow-up: Return in about 3 months (around 10/10/2023) for chronic pain management.   Sherlene Shams, MD

## 2023-07-10 NOTE — Assessment & Plan Note (Addendum)
She continues to defer workup , including repeat imaging

## 2023-07-10 NOTE — Patient Instructions (Signed)
Ultrasound of neck mass ordered   If you change your mind about 1) removal of neck mass or 2) Imaging of the cyst on your kidney,  PLEASE let me know   CONGRATS ON THE WEIGHT LOSS!

## 2023-07-11 MED ORDER — HYDROCODONE-ACETAMINOPHEN 10-325 MG PO TABS: 1.0000 | ORAL_TABLET | Freq: Four times a day (QID) | ORAL | 0 refills | Status: DC | PRN

## 2023-07-11 NOTE — Assessment & Plan Note (Signed)
Her BP is at goal with 12.5 mg of amlodipine and 25 mg carvedilol

## 2023-07-11 NOTE — Assessment & Plan Note (Signed)
Stable.  Continue aggressive conrol of hypertension and avoidance of  NSAIDS.  ACE/ARB are C/I  Lab Results  Component Value Date   CREATININE 1.04 07/10/2023   . Lab Results  Component Value Date   NA 139 07/10/2023   K 4.3 07/10/2023   CL 105 07/10/2023   CO2 27 07/10/2023   Lab Results  Component Value Date   LABMICR 20.3 07/04/2022   MICROALBUR 1.4 07/10/2023   MICROALBUR 1.0 07/13/2021

## 2023-07-11 NOTE — Assessment & Plan Note (Signed)
She remains asymptomatic.   Continue aspirin 81 mg daily. Crestor 40 mg and carvedilol.     Lab Results  Component Value Date   CHOL 119 07/10/2023   HDL 44.00 07/10/2023   LDLCALC 31 07/04/2022   LDLDIRECT 48.0 07/10/2023   TRIG 245.0 (H) 07/10/2023   CHOLHDL 3 07/10/2023

## 2023-07-11 NOTE — Assessment & Plan Note (Signed)
With b12 and iron deficiencies noted.  Anemia has resolved with supplementation.   She is up to date on Colonoscopy and has internal hemorroids which bleed periodically.  Continue follow up with hematology  Lab Results  Component Value Date   WBC 6.1 09/03/2022   HGB 11.9 (L) 09/03/2022   HCT 37.7 09/03/2022   MCV 89.8 09/03/2022   PLT 268 09/03/2022   Lab Results  Component Value Date   IRON 77 09/03/2022   TIBC 403 09/03/2022   FERRITIN 26 09/03/2022   Lab Results  Component Value Date   VITAMINB12 580 08/01/2022

## 2023-07-11 NOTE — Assessment & Plan Note (Signed)
Secondary to DDD and scoliosis .  Her pain is managed with vicodin and methocarbamol.  NSAIDS  C/i due to CKD   .  She has not had any ER visits  And has not requested any early refills.  Her Refill history was confirmed via [place] Controlled Substance database by me today during her visit and there have been no prescriptions of controlled substances filled from any providers other than me. .Her pain is better controlled on the current regimen and she is staying as  Active as her pain which now.  Vicodin will be refilled for 3 months

## 2023-07-11 NOTE — Assessment & Plan Note (Signed)
Eye exam is scheduled   Metformin recommended but deferred by patient. 0zempic discussed but declined. She has rmaintained an A1c  6.0 and lost weight with low GI diet and exercise. Foot exam normal today.  ACE/ARB C/I.   Continue amlodipine  at 12.5 mg daily and carvedilol 25 mg bid    Lab Results  Component Value Date   HGBA1C 5.6 07/10/2023    Lab Results  Component Value Date   LABMICR 20.3 07/04/2022   MICROALBUR 1.4 07/10/2023   MICROALBUR 1.0 07/13/2021

## 2023-08-07 ENCOUNTER — Ambulatory Visit: Payer: Medicare HMO

## 2023-08-11 ENCOUNTER — Ambulatory Visit (INDEPENDENT_AMBULATORY_CARE_PROVIDER_SITE_OTHER): Payer: Medicare HMO

## 2023-08-11 DIAGNOSIS — E538 Deficiency of other specified B group vitamins: Secondary | ICD-10-CM

## 2023-08-11 MED ORDER — CYANOCOBALAMIN 1000 MCG/ML IJ SOLN
1000.0000 ug | Freq: Once | INTRAMUSCULAR | Status: AC
Start: 2023-08-11 — End: 2023-09-10
  Administered 2023-09-10: 1000 ug via INTRAMUSCULAR

## 2023-08-11 NOTE — Progress Notes (Signed)
Pt presented for their vitamin B12 injection. Pt was identified through two identifiers. Pt tolerated shot well in their right deltoid.  I asked pt if she would like to switch it up because her last B12 was given in the right deltoid. Pt stated no it was given on the left side the last two times. I informed pt that we have to document everything and her last B12 clinical note stated she got it done in the right. :    Author: Francene Boyers, CMA Author Type: Certified Engineer, site Filed: 06/09/2023 11:08 AM  Note Status: Signed Cosign: Cosigned by Sherlene Shams, MD at 06/11/2023  9:42 AM Encounter Date: 06/09/2023  Editor: Francene Boyers, CMA (Certified Engineer, site)              Patient arrived for a B12 injection and it was administered into her right deltoid. Patient tolerated the injection well and did not show any signs of distress or voice any concerns.       Pt still insisted on getting B12 in the right deltoid.

## 2023-08-18 ENCOUNTER — Other Ambulatory Visit: Payer: Self-pay | Admitting: Internal Medicine

## 2023-09-08 ENCOUNTER — Ambulatory Visit: Payer: Medicare HMO

## 2023-09-10 ENCOUNTER — Ambulatory Visit: Payer: Medicare HMO | Admitting: *Deleted

## 2023-09-10 ENCOUNTER — Ambulatory Visit (INDEPENDENT_AMBULATORY_CARE_PROVIDER_SITE_OTHER): Payer: Medicare HMO | Admitting: *Deleted

## 2023-09-10 VITALS — BP 138/78 | HR 62 | Temp 97.0°F | Ht 59.0 in | Wt 135.0 lb

## 2023-09-10 DIAGNOSIS — Z0000 Encounter for general adult medical examination without abnormal findings: Secondary | ICD-10-CM

## 2023-09-10 DIAGNOSIS — E538 Deficiency of other specified B group vitamins: Secondary | ICD-10-CM | POA: Diagnosis not present

## 2023-09-10 NOTE — Patient Instructions (Addendum)
Managing Pain Without Opioids  Opioids are strong medicines used to treat moderate to severe pain. For some people, especially those who have long-term (chronic) pain, opioids may not be the best choice for pain management due to: Side effects like nausea, constipation, and sleepiness. The risk of addiction (opioid use disorder). The longer you take opioids, the greater your risk of addiction. Pain that lasts for more than 3 months is called chronic pain. Managing chronic pain usually requires more than one approach and is often provided by a team of health care providers working together (multidisciplinary approach). Pain management may be done at a pain management center or pain clinic. How to manage pain without the use of opioids Use non-opioid medicines Non-opioid medicines for pain may include: Over-the-counter or prescription non-steroidal anti-inflammatory drugs (NSAIDs). These may be the first medicines used for pain. They work well for muscle and bone pain, and they reduce swelling. Acetaminophen. This over-the-counter medicine may work well for milder pain but not swelling. Antidepressants. These may be used to treat chronic pain. A certain type of antidepressant (tricyclics) is often used. These medicines are given in lower doses for pain than when used for depression. Anticonvulsants. These are usually used to treat seizures but may also reduce nerve (neuropathic) pain. Muscle relaxants. These relieve pain caused by sudden muscle tightening (spasms). You may also use a pain medicine that is applied to the skin as a patch, cream, or gel (topical analgesic), such as a numbing medicine. These may cause fewer side effects than medicines taken by mouth. Do certain therapies as directed Some therapies can help with pain management. They include: Physical therapy. You will do exercises to gain strength and flexibility. A physical therapist may teach you exercises to move and stretch parts of  your body that are weak, stiff, or painful. You can learn these exercises at physical therapy visits and practice them at home. Physical therapy may also involve: Massage. Heat wraps or applying heat or cold to affected areas. Electrical signals that interrupt pain signals (transcutaneous electrical nerve stimulation, TENS). Weak lasers that reduce pain and swelling (low-level laser therapy). Signals from your body that help you learn to regulate pain (biofeedback). Occupational therapy. This helps you to learn ways to function at home and work with less pain. Recreational therapy. This involves trying new activities or hobbies, such as a physical activity or drawing. Mental health therapy, including: Cognitive behavioral therapy (CBT). This helps you learn coping skills for dealing with pain. Acceptance and commitment therapy (ACT) to change the way you think and react to pain. Relaxation therapies, including muscle relaxation exercises and mindfulness-based stress reduction. Pain management counseling. This may be individual, family, or group counseling.  Receive medical treatments Medical treatments for pain management include: Nerve block injections. These may include a pain blocker and anti-inflammatory medicines. You may have injections: Near the spine to relieve chronic back or neck pain. Into joints to relieve back or joint pain. Into nerve areas that supply a painful area to relieve body pain. Into muscles (trigger point injections) to relieve some painful muscle conditions. A medical device placed near your spine to help block pain signals and relieve nerve pain or chronic back pain (spinal cord stimulation device). Acupuncture. Follow these instructions at home Medicines Take over-the-counter and prescription medicines only as told by your health care provider. If you are taking pain medicine, ask your health care providers about possible side effects to watch out for. Do not  drive or use heavy  machinery while taking prescription opioid pain medicine. Lifestyle  Do not use drugs or alcohol to reduce pain. If you drink alcohol, limit how much you have to: 0-1 drink a day for women who are not pregnant. 0-2 drinks a day for men. Know how much alcohol is in a drink. In the [place] one drink equals one 12 oz bottle of beer (355 mL), one 5 oz glass of wine (148 mL), or one 1 oz glass of hard liquor (44 mL). Do not use any products that contain nicotine or tobacco. These products include cigarettes, chewing tobacco, and vaping devices, such as e-cigarettes. If you need help quitting, ask your health care provider. Eat a healthy diet and maintain a healthy weight. Poor diet and excess weight may make pain worse. Eat foods that are high in fiber. These include fresh fruits and vegetables, whole grains, and beans. Limit foods that are high in fat and processed sugars, such as fried and sweet foods. Exercise regularly. Exercise lowers stress and may help relieve pain. Ask your health care provider what activities and exercises are safe for you. If your health care provider approves, join an exercise class that combines movement and stress reduction. Examples include yoga and tai chi. Get enough sleep. Lack of sleep may make pain worse. Lower stress as much as possible. Practice stress reduction techniques as told by your therapist. General instructions Work with all your pain management providers to find the treatments that work best for you. You are an important member of your pain management team. There are many things you can do to reduce pain on your own. Consider joining an online or in-person support group for people who have chronic pain. Keep all follow-up visits. This is important. Where to find more information You can find more information about managing pain without opioids from: American Academy of Pain Medicine: painmed.org Institute for Chronic Pain:  instituteforchronicpain.org American Chronic Pain Association: theacpa.org Contact a health care provider if: You have side effects from pain medicine. Your pain gets worse or does not get better with treatments or home therapy. You are struggling with anxiety or depression. Summary Many types of pain can be managed without opioids. Chronic pain may respond better to pain management without opioids. Pain is best managed when you and a team of health care providers work together. Pain management without opioids may include non-opioid medicines, medical treatments, physical therapy, mental health therapy, and lifestyle changes. Tell your health care providers if your pain gets worse or is not being managed well enough. This information is not intended to replace advice given to you by your health care provider. Make sure you discuss any questions you have with your health care provider. Document Revised: 02/07/2021 Document Reviewed: 02/07/2021 Elsevier Patient Education  2024 [employer] Ms. Urbanovsky , Thank you for taking time to come for your Medicare Wellness Visit. I appreciate your ongoing commitment to your health goals. Please review the following plan we discussed and let me know if I can assist you in the future.   Referrals/Orders/Follow-Ups/Clinician Recommendations: Remember to update your vaccines.  This is a list of the screening recommended for you and due dates:  Health Maintenance  Topic Date Due   Zoster (Shingles) Vaccine (1 of 2) 08/17/1970   Pneumonia Vaccine (2 of 2 - PPSV23 or PCV20) 05/12/2019   DTaP/Tdap/Td vaccine (2 - Td or Tdap) 08/20/2023   Flu Shot  02/09/2024*   Eye exam for diabetics  10/08/2023   Hemoglobin A1C  01/09/2024   Mammogram  04/13/2024   Yearly kidney function blood test for diabetes  07/09/2024   Yearly kidney health urinalysis for diabetes  07/09/2024   Complete foot exam   07/09/2024   Medicare Annual Wellness Visit  09/09/2024   Colon  Cancer Screening  09/01/2025   DEXA scan (bone density measurement)  Completed   Hepatitis C Screening  Completed   HPV Vaccine  Aged Out   COVID-19 Vaccine  Discontinued   Cologuard (Stool DNA test)  Discontinued  *Topic was postponed. The date shown is not the original due date.    Advanced directives: (Declined) Advance directive discussed with you today. Even though you declined this today, please call our office should you change your mind, and we can give you the proper paperwork for you to fill out.  Next Medicare Annual Wellness Visit scheduled for next year: Yes 09/15/24 @ 10:20 Preventive Care 65 Years and Older, Female Preventive care refers to lifestyle choices and visits with your health care provider that can promote health and wellness. Preventive care visits are also called wellness exams. What can I expect for my preventive care visit? Counseling Your health care provider may ask you questions about your: Medical history, including: Past medical problems. Family medical history. Pregnancy and menstrual history. History of falls. Current health, including: Memory and ability to understand (cognition). Emotional well-being. Home life and relationship well-being. Sexual activity and sexual health. Lifestyle, including: Alcohol, nicotine or tobacco, and drug use. Access to firearms. Diet, exercise, and sleep habits. Work and work Astronomer. Sunscreen use. Safety issues such as seatbelt and bike helmet use. Physical exam Your health care provider will check your: Height and weight. These may be used to calculate your BMI (body mass index). BMI is a measurement that tells if you are at a healthy weight. Waist circumference. This measures the distance around your waistline. This measurement also tells if you are at a healthy weight and may help predict your risk of certain diseases, such as type 2 diabetes and high blood pressure. Heart rate and blood pressure. Body  temperature. Skin for abnormal spots. What immunizations do I need?  Vaccines are usually given at various ages, according to a schedule. Your health care provider will recommend vaccines for you based on your age, medical history, and lifestyle or other factors, such as travel or where you work. What tests do I need? Screening Your health care provider may recommend screening tests for certain conditions. This may include: Lipid and cholesterol levels. Hepatitis C test. Hepatitis B test. HIV (human immunodeficiency virus) test. STI (sexually transmitted infection) testing, if you are at risk. Lung cancer screening. Colorectal cancer screening. Diabetes screening. This is done by checking your blood sugar (glucose) after you have not eaten for a while (fasting). Mammogram. Talk with your health care provider about how often you should have regular mammograms. BRCA-related cancer screening. This may be done if you have a family history of breast, ovarian, tubal, or peritoneal cancers. Bone density scan. This is done to screen for osteoporosis. Talk with your health care provider about your test results, treatment options, and if necessary, the need for more tests. Follow these instructions at home: Eating and drinking  Eat a diet that includes fresh fruits and vegetables, whole grains, lean protein, and low-fat dairy products. Limit your intake of foods with high amounts of sugar, saturated fats, and salt. Take vitamin and mineral supplements as recommended by your health care provider. Do not drink alcohol  if your health care provider tells you not to drink. If you drink alcohol: Limit how much you have to 0-1 drink a day. Know how much alcohol is in your drink. In the [place] one drink equals one 12 oz bottle of beer (355 mL), one 5 oz glass of wine (148 mL), or one 1 oz glass of hard liquor (44 mL). Lifestyle Brush your teeth every morning and night with fluoride toothpaste. Floss one  time each day. Exercise for at least 30 minutes 5 or more days each week. Do not use any products that contain nicotine or tobacco. These products include cigarettes, chewing tobacco, and vaping devices, such as e-cigarettes. If you need help quitting, ask your health care provider. Do not use drugs. If you are sexually active, practice safe sex. Use a condom or other form of protection in order to prevent STIs. Take aspirin only as told by your health care provider. Make sure that you understand how much to take and what form to take. Work with your health care provider to find out whether it is safe and beneficial for you to take aspirin daily. Ask your health care provider if you need to take a cholesterol-lowering medicine (statin). Find healthy ways to manage stress, such as: Meditation, yoga, or listening to music. Journaling. Talking to a trusted person. Spending time with friends and family. Minimize exposure to UV radiation to reduce your risk of skin cancer. Safety Always wear your seat belt while driving or riding in a vehicle. Do not drive: If you have been drinking alcohol. Do not ride with someone who has been drinking. When you are tired or distracted. While texting. If you have been using any mind-altering substances or drugs. Wear a helmet and other protective equipment during sports activities. If you have firearms in your house, make sure you follow all gun safety procedures. What's next? Visit your health care provider once a year for an annual wellness visit. Ask your health care provider how often you should have your eyes and teeth checked. Stay up to date on all vaccines. This information is not intended to replace advice given to you by your health care provider. Make sure you discuss any questions you have with your health care provider. Document Revised: 04/25/2021 Document Reviewed: 04/25/2021 Elsevier Patient Education  2024 [employer].
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
## 2023-09-10 NOTE — Progress Notes (Signed)
Pt received B12 injection in Left  deltoid muscle. Pt tolerated it well with no complaints or concerns.  

## 2023-09-13 ENCOUNTER — Other Ambulatory Visit: Payer: Self-pay | Admitting: Internal Medicine

## 2023-09-24 NOTE — Telephone Encounter (Signed)
Error

## 2023-10-08 ENCOUNTER — Ambulatory Visit (INDEPENDENT_AMBULATORY_CARE_PROVIDER_SITE_OTHER): Payer: Medicare HMO | Admitting: Internal Medicine

## 2023-10-08 ENCOUNTER — Encounter: Payer: Self-pay | Admitting: Internal Medicine

## 2023-10-08 VITALS — BP 158/64 | HR 60 | Temp 98.2°F | Resp 16 | Ht 59.0 in | Wt 139.1 lb

## 2023-10-08 DIAGNOSIS — R221 Localized swelling, mass and lump, neck: Secondary | ICD-10-CM | POA: Diagnosis not present

## 2023-10-08 DIAGNOSIS — E785 Hyperlipidemia, unspecified: Secondary | ICD-10-CM | POA: Diagnosis not present

## 2023-10-08 DIAGNOSIS — I10 Essential (primary) hypertension: Secondary | ICD-10-CM | POA: Diagnosis not present

## 2023-10-08 DIAGNOSIS — Z23 Encounter for immunization: Secondary | ICD-10-CM | POA: Diagnosis not present

## 2023-10-08 DIAGNOSIS — E1169 Type 2 diabetes mellitus with other specified complication: Secondary | ICD-10-CM

## 2023-10-08 DIAGNOSIS — E669 Obesity, unspecified: Secondary | ICD-10-CM

## 2023-10-08 DIAGNOSIS — E538 Deficiency of other specified B group vitamins: Secondary | ICD-10-CM

## 2023-10-08 DIAGNOSIS — I152 Hypertension secondary to endocrine disorders: Secondary | ICD-10-CM | POA: Diagnosis not present

## 2023-10-08 DIAGNOSIS — Z955 Presence of coronary angioplasty implant and graft: Secondary | ICD-10-CM

## 2023-10-08 DIAGNOSIS — E1159 Type 2 diabetes mellitus with other circulatory complications: Secondary | ICD-10-CM | POA: Diagnosis not present

## 2023-10-08 DIAGNOSIS — M5416 Radiculopathy, lumbar region: Secondary | ICD-10-CM

## 2023-10-08 LAB — COMPREHENSIVE METABOLIC PANEL
ALT: 6 U/L (ref 0–35)
AST: 12 U/L (ref 0–37)
Albumin: 4.4 g/dL (ref 3.5–5.2)
Alkaline Phosphatase: 50 U/L (ref 39–117)
BUN: 12 mg/dL (ref 6–23)
CO2: 28 meq/L (ref 19–32)
Calcium: 9.2 mg/dL (ref 8.4–10.5)
Chloride: 106 meq/L (ref 96–112)
Creatinine, Ser: 0.97 mg/dL (ref 0.40–1.20)
GFR: 58.53 mL/min — ABNORMAL LOW (ref >60.00)
Glucose, Bld: 92 mg/dL (ref 70–99)
Potassium: 4.5 meq/L (ref 3.5–5.1)
Sodium: 141 meq/L (ref 135–145)
Total Bilirubin: 1 mg/dL (ref 0.2–1.2)
Total Protein: 7.3 g/dL (ref 6.0–8.3)

## 2023-10-08 MED ORDER — CYANOCOBALAMIN 1000 MCG/ML IJ SOLN
1000.0000 ug | Freq: Once | INTRAMUSCULAR | Status: AC
  Administered 2023-10-08: 1000 ug via INTRAMUSCULAR

## 2023-10-08 MED ORDER — HYDROCODONE-ACETAMINOPHEN 10-325 MG PO TABS: 1.0000 | ORAL_TABLET | Freq: Four times a day (QID) | ORAL | 0 refills | Status: DC | PRN

## 2023-10-08 MED ORDER — HYDROCHLOROTHIAZIDE 12.5 MG PO TABS: 12.5000 mg | ORAL_TABLET | Freq: Every day | ORAL | 3 refills | Status: AC

## 2023-10-08 NOTE — Patient Instructions (Signed)
I am adding 12.5 mg hydrochlorothiazide once daily in the morning for your blood pressure   I will order the CT scan to reevaluate the lymph  node on the right side to see if it has changed since 2015

## 2023-10-08 NOTE — Progress Notes (Unsigned)
Subjective:  Patient ID: Suzanne Cole, female    DOB: 1951-04-20  Age: 72 y.o. MRN: [id number]  CC: The primary encounter diagnosis was Obesity, diabetes, and hypertension syndrome (HCC). Diagnoses of Hyperlipidemia LDL goal <70, Need for influenza vaccination, Neck mass, B12 deficiency, Lumbar radiculitis, and Primary hypertension were also pertinent to this visit.   HPI Suzanne Cole presents for  Chief Complaint  Patient presents with   Medical Management of Chronic Issues    3 month follow up     1) chronic back pain :  she continues to have back pain secondary to severe scoliosis and degenerative changes thathave resulted in  requires use of hydrocodone to manage   2) right sided neck mass:  she continues to decline imaging for surveillance despite incomplete diagnostic workup in 2015 and change in size of mass .  She has deferred  another biopsy.  Patient states that the mass changes in size  on a regular basis ,  starts out small in the morning,  by end of it is slightly larger. But never painful unless she rubs it or squeezes    3)  Type 2 DM:  She  feels generally well, but is not exercising regularly or trying to lose weight. Checking  blood sugars less than once daily at variable times, usually only if she feels she may be having a hypoglycemic event. .  BS have been under 130 fasting and < 150 post prandially.  Denies any recent hypoglyemic events.  Taking   medications as directed. Following a carbohydrate modified diet 6 days per week. Denies numbness, burning and tingling of extremities. Appetite is good.    4) RECENT abrasion of right achilles /posterior anle , left  when a shpper at [employer] into her with a shopping  cart  5) HTN:   Patient is taking her medications as prescribed and notes no adverse effects.  Home BP readings have been done about once per week and are  generally  > 130/80 .  She is avoiding added salt in her diet and walking regularly about 3 times  per week for exercise  . home readings 139/69    Outpatient Medications Prior to Visit  Medication Sig Dispense Refill   albuterol (PROVENTIL) (2.5 MG/3ML) 0.083% nebulizer solution Take 3 mLs (2.5 mg total) by nebulization every 6 (six) hours as needed for wheezing or shortness of breath. 150 mL 1   albuterol (VENTOLIN HFA) 108 (90 Base) MCG/ACT inhaler INHALE 2 PUFFS BY MOUTH EVERY 6 HOURS AS NEEDED FOR WHEEZING OR SHORTNESS OF BREATH 18 g 2   amLODipine (NORVASC) 10 MG tablet Take 1 tablet (10 mg total) by mouth at bedtime. IN ADDITION TO 2.5 MG DOSE 90 tablet 3   amLODipine (NORVASC) 2.5 MG tablet Take 1 tablet (2.5 mg total) by mouth daily. IN ADDITION TO 10 MG DOSE 90 tablet 1   aspirin 81 MG tablet Take 81 mg by mouth as needed for pain.     carvedilol (COREG) 25 MG tablet TAKE 1 TABLET(25 MG) BY MOUTH TWICE DAILY 180 tablet 3   cetirizine (ZYRTEC) 10 MG tablet Take 10 mg by mouth daily.     Cholecalciferol (VITAMIN D3) 1000 units CHEW Chew 1 tablet by mouth daily.     Ferrous Sulfate (IRON) 142 (45 Fe) MG TBCR Take 1 tablet by mouth daily. 30 tablet 1   fluticasone (FLONASE) 50 MCG/ACT nasal spray Place 2 sprays into both nostrils daily.  16 g 6   gabapentin (NEURONTIN) 300 MG capsule TAKE 1 CAPSULE(300 MG) BY MOUTH THREE TIMES DAILY 270 capsule 1   methocarbamol (ROBAXIN) 750 MG tablet TAKE 1 TABLET(750 MG) BY MOUTH FOUR TIMES DAILY 360 tablet 1   pantoprazole (PROTONIX) 40 MG tablet TAKE 1 TABLET(40 MG) BY MOUTH DAILY 90 tablet 3   predniSONE (DELTASONE) 10 MG tablet 6 tablets on Day 1 , then reduce by 1 tablet daily until gone 21 tablet 0   rosuvastatin (CRESTOR) 40 MG tablet TAKE 1 TABLET(40 MG) BY MOUTH DAILY 90 tablet 3   HYDROcodone-acetaminophen (NORCO) 10-325 MG tablet Take 1 tablet by mouth every 6 (six) hours as needed. 120 tablet 0   HYDROcodone-acetaminophen (NORCO) 10-325 MG tablet Take 1 tablet by mouth every 6 (six) hours as needed. 120 tablet 0   HYDROcodone-acetaminophen  (NORCO) 10-325 MG tablet Take 1 tablet by mouth every 6 (six) hours as needed. 120 tablet 0   No facility-administered medications prior to visit.    Review of Systems;  Patient denies headache, fevers, malaise, unintentional weight loss, skin rash, eye pain, sinus congestion and sinus pain, sore throat, dysphagia,  hemoptysis , cough, dyspnea, wheezing, chest pain, palpitations, orthopnea, edema, abdominal pain, nausea, melena, diarrhea, constipation, flank pain, dysuria, hematuria, urinary  Frequency, nocturia, numbness, tingling, seizures,  Focal weakness, Loss of consciousness,  Tremor, insomnia, depression, anxiety, and suicidal ideation.      Objective:  BP (!) 158/64   Pulse 60   Temp 98.2 F (36.8 C)   Resp 16   Ht 4\' 11"  (1.499 m)   Wt 139 lb 2 oz (63.1 kg)   SpO2 99%   BMI 28.10 kg/m   BP Readings from Last 3 Encounters:  10/08/23 (!) 158/64  09/10/23 138/78  07/10/23 132/74    Wt Readings from Last 3 Encounters:  10/08/23 139 lb 2 oz (63.1 kg)  09/10/23 135 lb (61.2 kg)  07/10/23 138 lb 9.6 oz (62.9 kg)    Physical Exam Vitals reviewed.  Constitutional:      General: She is not in acute distress.    Appearance: Normal appearance. She is normal weight. She is not ill-appearing, toxic-appearing or diaphoretic.  HENT:     Head: Normocephalic.  Eyes:     General: No scleral icterus.       Right eye: No discharge.        Left eye: No discharge.     Conjunctiva/sclera: Conjunctivae normal.  Cardiovascular:     Rate and Rhythm: Normal rate and regular rhythm.     Heart sounds: Normal heart sounds.  Pulmonary:     Effort: Pulmonary effort is normal. No respiratory distress.     Breath sounds: Normal breath sounds.  Musculoskeletal:        General: Normal range of motion.  Skin:    General: Skin is warm and dry.  Neurological:     General: No focal deficit present.     Mental Status: She is alert and oriented to person, place, and time. Mental status is at  baseline.  Psychiatric:        Mood and Affect: Mood normal.        Behavior: Behavior normal.        Thought Content: Thought content normal.        Judgment: Judgment normal.   Lab Results  Component Value Date   HGBA1C 5.6 07/10/2023   HGBA1C 5.5 01/07/2023   HGBA1C 5.8 10/07/2022    Lab  Results  Component Value Date   CREATININE 0.97 10/08/2023   CREATININE 1.04 07/10/2023   CREATININE 0.96 01/07/2023    Lab Results  Component Value Date   WBC 6.1 09/03/2022   HGB 11.9 (L) 09/03/2022   HCT 37.7 09/03/2022   PLT 268 09/03/2022   GLUCOSE 92 10/08/2023   CHOL 119 07/10/2023   TRIG 245.0 (H) 07/10/2023   HDL 44.00 07/10/2023   LDLDIRECT 48.0 07/10/2023   LDLCALC 31 07/04/2022   ALT 6 10/08/2023   AST 12 10/08/2023   NA 141 10/08/2023   K 4.5 10/08/2023   CL 106 10/08/2023   CREATININE 0.97 10/08/2023   BUN 12 10/08/2023   CO2 28 10/08/2023   TSH 1.060 06/19/2022   INR 1.0 06/20/2022   HGBA1C 5.6 07/10/2023   MICROALBUR 1.4 07/10/2023    NM Myocar Multi W/Spect W/Wall Motion / EF  Result Date: 06/25/2022 Challenging study Pharmacological myocardial perfusion imaging study with no significant  ischemia Small region fixed defect mid to distal anteroseptal wall noted, unable to exclude breast attenuation artifact Unable to visualize wall motion, ejection fraction not calculated secondary to significant GI uptake artifact No EKG changes concerning for ischemia at peak stress or in recovery. Likely low risk scan, consider alternate imaging study if clinically indicated Signed, Dossie Arbour, MD, Ph.D [facility]    Assessment & Plan:  .Obesity, diabetes, and hypertension syndrome (HCC) Assessment & Plan: Eye exam is scheduled   Metformin recommended but deferred by patient. 0zempic discussed but declined. She has rmaintained an A1c < 6.0 and lost weight with low GI diet and exercise. Foot exam normal today.  ACE/ARB C/I.     Lab Results  Component Value Date    HGBA1C 5.6 07/10/2023    Lab Results  Component Value Date   LABMICR 20.3 07/04/2022   MICROALBUR 1.4 07/10/2023   MICROALBUR 1.0 07/13/2021       Orders: -     Comprehensive metabolic panel; Future -     Hemoglobin A1c; Future -     TSH; Future -     CBC with Differential/Platelet; Future -     Comprehensive metabolic panel  Hyperlipidemia LDL goal <70 -     Lipid panel; Future -     LDL cholesterol, direct; Future  Need for influenza vaccination -     Flu Vaccine Trivalent High Dose (Fluad)  Neck mass -     CT SOFT TISSUE NECK W CONTRAST; Future  B12 deficiency -     Cyanocobalamin  Lumbar radiculitis Assessment & Plan: Secondary to DDD and scoliosis .  Her pain is managed with vicodin and methocarbamol.  NSAIDS  C/i due to CKD   .  She has not had any ER visits  And has not requested any early refills.  Her Refill history was confirmed via [place] Controlled Substance database by me today during her visit and there have been no prescriptions of controlled substances filled from any providers other than me. .Her pain is better controlled on the current regimen and she is staying as active as her pain which now.  Vicodin will be refilled for 3 months    Primary hypertension Assessment & Plan:  Home readings have been mostly >.130/80  she is on maximal doses of amlodipine and carvedilol.   Adding hydrochlorothiazide 12.5 mg     Other orders -     hydroCHLOROthiazide; Take 1 tablet (12.5 mg total) by mouth daily.  Dispense: 90 tablet; Refill: 3 -  HYDROcodone-Acetaminophen; Take 1 tablet by mouth every 6 (six) hours as needed.  Dispense: 120 tablet; Refill: 0 -     HYDROcodone-Acetaminophen; Take 1 tablet by mouth every 6 (six) hours as needed.  Dispense: 120 tablet; Refill: 0 -     HYDROcodone-Acetaminophen; Take 1 tablet by mouth every 6 (six) hours as needed.  Dispense: 120 tablet; Refill: 0     I provided 30 minutes of face-to-face time during this encounter  reviewing patient's last visit with me, patient's  most recent visit with cardiology,  ,  recent surgical and non surgical procedures, previous  labs and imaging studies, counseling on currently addressed issues,  and post visit ordering to diagnostics and therapeutics .   Follow-up: No follow-ups on file.   Sherlene Shams, MD

## 2023-10-09 NOTE — Assessment & Plan Note (Addendum)
Home readings have been mostly >.130/80  she is on maximal doses of amlodipine and carvedilol.   Adding hydrochlorothiazide 12.5 mg

## 2023-10-09 NOTE — Assessment & Plan Note (Signed)
Eye exam is scheduled   Metformin recommended but deferred by patient. 0zempic discussed but declined. She has rmaintained an A1c < 6.0 and lost weight with low GI diet and exercise. Foot exam normal today.  ACE/ARB C/I.     Lab Results  Component Value Date   HGBA1C 5.6 07/10/2023    Lab Results  Component Value Date   LABMICR 20.3 07/04/2022   MICROALBUR 1.4 07/10/2023   MICROALBUR 1.0 07/13/2021

## 2023-10-09 NOTE — Assessment & Plan Note (Signed)
She remains asymptomatic.  Continue plavix, crestor,  carvedilol

## 2023-10-09 NOTE — Assessment & Plan Note (Signed)
Secondary to DDD and scoliosis .  Her pain is managed with vicodin and methocarbamol.  NSAIDS  C/i due to CKD   .  She has not had any ER visits  And has not requested any early refills.  Her Refill history was confirmed via [place] Controlled Substance database by me today during her visit and there have been no prescriptions of controlled substances filled from any providers other than me. .Her pain is better controlled on the current regimen and she is staying as active as her pain which now.  Vicodin will be refilled for 3 months

## 2023-10-13 ENCOUNTER — Telehealth: Payer: Self-pay | Admitting: Internal Medicine

## 2023-10-13 ENCOUNTER — Ambulatory Visit: Payer: Medicare HMO | Admitting: Internal Medicine

## 2023-10-13 NOTE — Telephone Encounter (Signed)
Lft pt vm to call ofc to sch CT. thanks 

## 2023-10-15 DIAGNOSIS — H43811 Vitreous degeneration, right eye: Secondary | ICD-10-CM | POA: Diagnosis not present

## 2023-10-15 DIAGNOSIS — Z0100 Encounter for examination of eyes and vision without abnormal findings: Secondary | ICD-10-CM | POA: Diagnosis not present

## 2023-10-15 DIAGNOSIS — H25813 Combined forms of age-related cataract, bilateral: Secondary | ICD-10-CM | POA: Diagnosis not present

## 2023-10-15 DIAGNOSIS — E119 Type 2 diabetes mellitus without complications: Secondary | ICD-10-CM | POA: Diagnosis not present

## 2023-10-15 DIAGNOSIS — H35363 Drusen (degenerative) of macula, bilateral: Secondary | ICD-10-CM | POA: Diagnosis not present

## 2023-10-15 LAB — HM DIABETES EYE EXAM

## 2023-10-29 ENCOUNTER — Ambulatory Visit
Admission: RE | Admit: 2023-10-29 | Discharge: 2023-10-29 | Disposition: A | Discharge status: Home / Self Care | Payer: Medicare HMO | Source: Ambulatory Visit | Attending: Internal Medicine | Admitting: Internal Medicine

## 2023-10-29 DIAGNOSIS — R221 Localized swelling, mass and lump, neck: Secondary | ICD-10-CM | POA: Insufficient documentation

## 2023-10-29 DIAGNOSIS — K111 Hypertrophy of salivary gland: Secondary | ICD-10-CM | POA: Diagnosis not present

## 2023-10-29 DIAGNOSIS — E049 Nontoxic goiter, unspecified: Secondary | ICD-10-CM | POA: Diagnosis not present

## 2023-10-29 DIAGNOSIS — K118 Other diseases of salivary glands: Secondary | ICD-10-CM | POA: Diagnosis not present

## 2023-10-29 DIAGNOSIS — I6523 Occlusion and stenosis of bilateral carotid arteries: Secondary | ICD-10-CM | POA: Diagnosis not present

## 2023-10-29 MED ORDER — IOHEXOL 300 MG/ML  SOLN
75.0000 mL | Freq: Once | INTRAMUSCULAR | Status: AC | PRN
Start: 2023-10-29 — End: 2023-10-29
  Administered 2023-10-29: 75 mL via INTRAVENOUS

## 2023-11-07 ENCOUNTER — Ambulatory Visit (INDEPENDENT_AMBULATORY_CARE_PROVIDER_SITE_OTHER): Payer: Medicare HMO

## 2023-11-07 DIAGNOSIS — E538 Deficiency of other specified B group vitamins: Secondary | ICD-10-CM

## 2023-11-07 MED ORDER — CYANOCOBALAMIN 1000 MCG/ML IJ SOLN
1000.0000 ug | Freq: Once | INTRAMUSCULAR | Status: AC
  Administered 2023-11-07: 1000 ug via INTRAMUSCULAR

## 2023-11-07 NOTE — Progress Notes (Signed)
Patient is in office today for a nurse visit for B12 Injection. Patient Injection was given in the  Right deltoid. Patient tolerated injection well.

## 2023-11-11 ENCOUNTER — Other Ambulatory Visit: Payer: Self-pay

## 2023-11-11 MED ORDER — PANTOPRAZOLE SODIUM 40 MG PO TBEC: DELAYED_RELEASE_TABLET | ORAL | 3 refills | Status: DC

## 2023-11-14 ENCOUNTER — Telehealth: Payer: Self-pay

## 2023-11-14 NOTE — Telephone Encounter (Signed)
 Called pt via mobile number LVM to CB in regards to [place] results

## 2023-11-15 ENCOUNTER — Other Ambulatory Visit: Payer: Self-pay | Admitting: Internal Medicine

## 2023-11-24 ENCOUNTER — Telehealth: Payer: Self-pay

## 2023-11-24 NOTE — Telephone Encounter (Signed)
 Copied from CRM [id number]. Topic: General - Call Back - No Documentation >> Nov 24, 2023 10:32 AM Suzanne Cole wrote: Reason for CRM: Patient called stating she was returning a missed call from [place], I do not see documentation of call being made / please call [phone]

## 2023-11-25 NOTE — Telephone Encounter (Signed)
 Pt spoke to [place]. See imaging results

## 2023-12-05 ENCOUNTER — Ambulatory Visit (INDEPENDENT_AMBULATORY_CARE_PROVIDER_SITE_OTHER): Payer: Medicare HMO

## 2023-12-05 DIAGNOSIS — E538 Deficiency of other specified B group vitamins: Secondary | ICD-10-CM

## 2023-12-05 MED ORDER — CYANOCOBALAMIN 1000 MCG/ML IJ SOLN
1000.0000 ug | Freq: Once | INTRAMUSCULAR | Status: AC
  Administered 2023-12-05: 1000 ug via INTRAMUSCULAR

## 2023-12-05 NOTE — Progress Notes (Signed)
Patient presented for B 12 injection to left deltoid, patient voiced no concerns nor showed any signs of distress during injection.

## 2023-12-06 ENCOUNTER — Other Ambulatory Visit: Payer: Self-pay | Admitting: Internal Medicine

## 2023-12-13 ENCOUNTER — Other Ambulatory Visit: Payer: Self-pay | Admitting: Internal Medicine

## 2023-12-18 ENCOUNTER — Ambulatory Visit: Payer: Medicare HMO | Admitting: Medical

## 2023-12-22 DIAGNOSIS — D37030 Neoplasm of uncertain behavior of the parotid salivary glands: Secondary | ICD-10-CM | POA: Diagnosis not present

## 2023-12-25 ENCOUNTER — Other Ambulatory Visit: Payer: Self-pay | Admitting: Otolaryngology

## 2023-12-25 DIAGNOSIS — K118 Other diseases of salivary glands: Secondary | ICD-10-CM

## 2023-12-25 NOTE — Progress Notes (Signed)
Irish Lack, MD sent to [facility] PROCEDURE / BIOPSY REVIEW Date: 12/23/23  Requested Biopsy site: Right parotid Reason for request: Right parotid mass Imaging review: Best seen on [place] and CT  Decision: Approved Imaging modality to perform: Ultrasound Schedule with: No sedation / Local anesthetic Schedule for: Any VIR  Additional comments: @VIR : FNA first. Optional core bx. @Schedulers .  Please contact me with questions, concerns, or if issue pertaining to this request arise.  Reola Calkins, MD [facility]

## 2024-01-01 NOTE — Progress Notes (Signed)
Patient for US guided FNA/Core RT Parotid mass biopsy on Friday 01/02/24, I called and LVM for the patient on the phone and gave pre-procedure instructions. VM made pt aware to be here at 12:30p and check in at the [facility] registration desk. Called 01/01/24

## 2024-01-02 ENCOUNTER — Ambulatory Visit
Admission: RE | Admit: 2024-01-02 | Discharge: 2024-01-02 | Disposition: A | Discharge status: Home / Self Care | Payer: Medicare HMO | Source: Ambulatory Visit | Attending: Otolaryngology | Admitting: Otolaryngology

## 2024-01-02 DIAGNOSIS — K118 Other diseases of salivary glands: Secondary | ICD-10-CM | POA: Insufficient documentation

## 2024-01-02 DIAGNOSIS — D110 Benign neoplasm of parotid gland: Secondary | ICD-10-CM | POA: Diagnosis not present

## 2024-01-02 DIAGNOSIS — R221 Localized swelling, mass and lump, neck: Secondary | ICD-10-CM | POA: Diagnosis present

## 2024-01-02 MED ORDER — LIDOCAINE HCL (PF) 1 % IJ SOLN
10.0000 mL | Freq: Once | INTRAMUSCULAR | Status: AC
Start: 2024-01-02 — End: 2024-01-02
  Administered 2024-01-02: 10 mL via INTRADERMAL
  Filled 2024-01-02: qty 10

## 2024-01-06 LAB — CYTOLOGY - NON PAP

## 2024-01-07 ENCOUNTER — Ambulatory Visit: Payer: Medicare HMO | Admitting: Internal Medicine

## 2024-01-07 ENCOUNTER — Encounter: Payer: Self-pay | Admitting: Internal Medicine

## 2024-01-07 VITALS — BP 134/60 | HR 65 | Ht 59.0 in | Wt 134.6 lb

## 2024-01-07 DIAGNOSIS — E538 Deficiency of other specified B group vitamins: Secondary | ICD-10-CM | POA: Diagnosis not present

## 2024-01-07 DIAGNOSIS — N183 Chronic kidney disease, stage 3 unspecified: Secondary | ICD-10-CM | POA: Diagnosis not present

## 2024-01-07 DIAGNOSIS — E1169 Type 2 diabetes mellitus with other specified complication: Secondary | ICD-10-CM

## 2024-01-07 DIAGNOSIS — I5030 Unspecified diastolic (congestive) heart failure: Secondary | ICD-10-CM | POA: Diagnosis not present

## 2024-01-07 DIAGNOSIS — D119 Benign neoplasm of major salivary gland, unspecified: Secondary | ICD-10-CM | POA: Insufficient documentation

## 2024-01-07 DIAGNOSIS — I130 Hypertensive heart and chronic kidney disease with heart failure and stage 1 through stage 4 chronic kidney disease, or unspecified chronic kidney disease: Secondary | ICD-10-CM

## 2024-01-07 DIAGNOSIS — E669 Obesity, unspecified: Secondary | ICD-10-CM | POA: Diagnosis not present

## 2024-01-07 DIAGNOSIS — Z01818 Encounter for other preprocedural examination: Secondary | ICD-10-CM

## 2024-01-07 DIAGNOSIS — E1159 Type 2 diabetes mellitus with other circulatory complications: Secondary | ICD-10-CM

## 2024-01-07 DIAGNOSIS — I152 Hypertension secondary to endocrine disorders: Secondary | ICD-10-CM | POA: Diagnosis not present

## 2024-01-07 HISTORY — DX: Benign neoplasm of major salivary gland, unspecified: D11.9

## 2024-01-07 LAB — POCT GLYCOSYLATED HEMOGLOBIN (HGB A1C): Hemoglobin A1C: 5.4 % (ref 4.0–5.6)

## 2024-01-07 MED ORDER — AMLODIPINE BESYLATE 2.5 MG PO TABS: 2.5000 mg | ORAL_TABLET | Freq: Every day | ORAL | 1 refills | Status: DC

## 2024-01-07 MED ORDER — CYANOCOBALAMIN 1000 MCG/ML IJ SOLN
1000.0000 ug | Freq: Once | INTRAMUSCULAR | Status: AC
Start: 2024-01-07 — End: 2024-01-07
  Administered 2024-01-07: 1000 ug via INTRAMUSCULAR

## 2024-01-07 NOTE — Progress Notes (Unsigned)
After obtaining consent, and per orders of Dr. Tullo, injection of B-12 given IM in right deltoid by Theron Cumbie Lynn. Patient tolerated injection well.  

## 2024-01-07 NOTE — Assessment & Plan Note (Signed)
 Managed  with diet per patient preference:   Metformin recommended but deferred by patient. 0zempic discussed but declined. She has maintained an A1c < 6.0 and maintained an intended weight loss (with another 5 lbs lost since  November)t with low GI diet and exercise. Foot exam normal today.  T she has a contraindication ACE/ARB .  She is taking a high potency statin   Lab Results  Component Value Date   HGBA1C 5.6 07/10/2023    Lab Results  Component Value Date   LABMICR 20.3 07/04/2022   MICROALBUR 1.4 07/10/2023   MICROALBUR 1.0 07/13/2021

## 2024-01-07 NOTE — Assessment & Plan Note (Signed)
 Her BP is at goal with 12.5 mg of amlodipine and 25 mg carvedilol

## 2024-01-07 NOTE — Progress Notes (Addendum)
 Subjective:  Patient ID: Suzanne Cole, female    DOB: 1951-07-18  Age: 73 y.o. 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   HPI ADRIANNE SHACKLETON presents for  Chief Complaint  Patient presents with   Medical Management of Chronic Issues   1) chronic back pain:  last seen Nov 27 and 3 months of refills for hydrocodone written.  Refill history confirmed via [place] Controlled Substance databas, accessed by me today.. last refill was done on Jan 31 She has not had any ER visits  And has not requested any early refills.  Her Refill history was confirmed via [place] Controlled Substance database by me today during her visit and there have been no prescriptions of controlled substances filled from any providers other than me. [name]    Has been having more pain in the left hip lately,  getting in and out of the bed and in and out of the care is difficult. . Does not want to see orthopedics "I won't do a hip replacement)    2) Diabetes obesity hypertension :  she continues to lose weight intentionally with low GI diet and exercise.   Home BPs have been < 130/80 on amlodipine 12.5 mg and max dose carvedilol 25 mg bid without side effects (no edema, bradycardia).  She does not require medication for diabetes management   3) she underwent right parotid gland biopsy Feb 21 due to progressive enlargement.   The procedure was very painful,  Swelling has improved  with ice packs ,  bruising has resolved as well.   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 Has not smoked in ten years, (when gland was first noticed ) .  She is waiting to schedule  surgical removal of gland due  to its progressive enlargement.  She denies any trouble swallowing and has had no  chest pain in the last 6 weeks,    4) Allergic rhinitis aggravated by yardwork  using flonase at night, zyrtec .  And  using navage after working outside .  Still congested at times   Outpatient Medications Prior to Visit  Medication Sig Dispense Refill   albuterol (PROVENTIL) (2.5 MG/3ML) 0.083% nebulizer solution Take 3 mLs (2.5 mg total) by nebulization every 6 (six) hours as needed for wheezing or shortness of breath. 150 mL 1   albuterol (VENTOLIN HFA) 108 (90 Base) MCG/ACT inhaler INHALE 2 PUFFS BY MOUTH EVERY 6 HOURS AS NEEDED FOR WHEEZING OR SHORTNESS OF BREATH 18 g 2   amLODipine (NORVASC) 10 MG tablet TAKE 1 TABLET(10 MG) BY MOUTH AT BEDTIME 90 tablet 3   aspirin 81 MG tablet Take 81 mg by mouth as needed for pain.     carvedilol (COREG) 25 MG tablet TAKE 1 TABLET(25 MG) BY MOUTH TWICE DAILY 180 tablet 3   cetirizine (ZYRTEC) 10 MG tablet Take 10 mg by mouth daily.     Cholecalciferol (VITAMIN D3) 1000 units CHEW Chew 1 tablet by mouth daily.     Ferrous Sulfate (IRON) 142 (45 Fe) MG TBCR Take 1 tablet by mouth daily. 30 tablet 1   fluticasone (FLONASE) 50 MCG/ACT nasal spray Place 2 sprays into both nostrils daily. 16 g 6   gabapentin (NEURONTIN) 300 MG capsule TAKE 1  CAPSULE(300 MG) BY MOUTH THREE TIMES DAILY 270 capsule 1   hydrochlorothiazide (HYDRODIURIL) 12.5 MG tablet Take 1 tablet (12.5 mg total) by mouth daily. 90 tablet 3   HYDROcodone-acetaminophen (NORCO) 10-325 MG tablet Take 1 tablet by mouth every 6 (six) hours as needed. 120 tablet 0   HYDROcodone-acetaminophen (NORCO) 10-325 MG tablet Take 1 tablet by mouth every 6 (six) hours as needed. 120 tablet 0   methocarbamol (ROBAXIN) 750 MG tablet TAKE 1 TABLET(750 MG) BY MOUTH FOUR TIMES DAILY 360 tablet 1   pantoprazole (PROTONIX) 40 MG tablet TAKE 1 TABLET(40 MG) BY MOUTH DAILY 90 tablet 3   predniSONE (DELTASONE) 10 MG tablet 6 tablets on Day 1 , then reduce by 1 tablet daily until gone 21 tablet 0   rosuvastatin  (CRESTOR) 40 MG tablet TAKE 1 TABLET(40 MG) BY MOUTH DAILY 90 tablet 3   amLODipine (NORVASC) 2.5 MG tablet Take 1 tablet (2.5 mg total) by mouth daily. IN ADDITION TO 10 MG DOSE 90 tablet 1   HYDROcodone-acetaminophen (NORCO) 10-325 MG tablet Take 1 tablet by mouth every 6 (six) hours as needed. 120 tablet 0   No facility-administered medications prior to visit.    Review of Systems;  Patient denies headache, fevers, malaise, unintentional weight loss, skin rash, eye pain, sinus congestion and sinus pain, sore throat, dysphagia,  hemoptysis , cough, dyspnea, wheezing, chest pain, palpitations, orthopnea, edema, abdominal pain, nausea, melena, diarrhea, constipation, flank pain, dysuria, hematuria, urinary  Frequency, nocturia, numbness, tingling, seizures,  Focal weakness, Loss of consciousness,  Tremor, insomnia, depression, anxiety, and suicidal ideation.      Objective:  BP 134/60   Pulse 65   Ht 4\' 11"  (1.499 m)   Wt 134 lb 9.6 oz (61.1 kg)   SpO2 95%   BMI 27.19 kg/m   BP Readings from Last 3 Encounters:  01/07/24 134/60  01/02/24 (!) 145/55  10/08/23 (!) 158/64    Wt Readings from Last 3 Encounters:  01/07/24 134 lb 9.6 oz (61.1 kg)  10/08/23 139 lb 2 oz (63.1 kg)  09/10/23 135 lb (61.2 kg)    Physical Exam Vitals reviewed.  Constitutional:      General: She is not in acute distress.    Appearance: Normal appearance. She is normal weight. She is not ill-appearing, toxic-appearing or diaphoretic.  HENT:     Head: Normocephalic.  Eyes:     General: No scleral icterus.       Right eye: No discharge.        Left eye: No discharge.     Conjunctiva/sclera: Conjunctivae normal.  Neck:      Comments: Large right parotid mass  Cardiovascular:     Rate and Rhythm: Normal rate and regular rhythm.     Heart sounds: Normal heart sounds.  Pulmonary:     Effort: Pulmonary effort is normal. No respiratory distress.     Breath sounds: Normal breath sounds.   Musculoskeletal:        General: Normal range of motion.  Skin:    General: Skin is warm and dry.  Neurological:     General: No focal deficit present.     Mental Status: She is alert and oriented to person, place, and time. Mental status is at baseline.  Psychiatric:        Mood and Affect: Mood normal.        Behavior: Behavior normal.        Thought Content: Thought content normal.  Judgment: Judgment normal.    Lab Results  Component Value Date   HGBA1C 5.4 01/07/2024   HGBA1C 5.6 07/10/2023   HGBA1C 5.5 01/07/2023    Lab Results  Component Value Date   CREATININE 0.97 10/08/2023   CREATININE 1.04 07/10/2023   CREATININE 0.96 01/07/2023    Lab Results  Component Value Date   WBC 6.1 09/03/2022   HGB 11.9 (L) 09/03/2022   HCT 37.7 09/03/2022   PLT 268 09/03/2022   GLUCOSE 92 10/08/2023   CHOL 119 07/10/2023   TRIG 245.0 (H) 07/10/2023   HDL 44.00 07/10/2023   LDLDIRECT 48.0 07/10/2023   LDLCALC 31 07/04/2022   ALT 6 10/08/2023   AST 12 10/08/2023   NA 141 10/08/2023   K 4.5 10/08/2023   CL 106 10/08/2023   CREATININE 0.97 10/08/2023   BUN 12 10/08/2023   CO2 28 10/08/2023   TSH 1.060 06/19/2022   INR 1.0 06/20/2022   HGBA1C 5.4 01/07/2024   MICROALBUR 1.4 07/10/2023    [place] FNA SALIVARY GLAND/PAROTID GLAND Result Date: 01/02/2024 INDICATION: 73 year old female with enlarging right inferior parotid mass. She presents for fine-needle aspiration biopsy. EXAM: Ultrasound-guided fine-needle aspiration biopsy of parotid mass MEDICATIONS: None. ANESTHESIA/SEDATION: None. COMPLICATIONS: None. PROCEDURE: Informed written consent was obtained from the patient after a thorough discussion of the procedural risks, benefits and alternatives. All questions were addressed. Maximal Sterile Barrier Technique was utilized including caps, mask, sterile gowns, sterile gloves, sterile drape, hand hygiene and skin antiseptic. A timeout was performed prior to the initiation of  the procedure. Ultrasound interrogation of the inferior aspect of the right parotid gland demonstrates a large predominantly solid hypoechoic mass with internal vascularity. The overlying skin was sterilely prepped and draped in the standard fashion using chlorhexidine skin prep. Local anesthesia was attained by infiltration with 1% lidocaine. A small dermatotomy was made. Under real-time ultrasound guidance, multiple 25 gauge fine-needle aspiration biopsies were obtained. On-site cytopathology confirmed adequate sampling. Post biopsy ultrasound imaging demonstrates no evidence of complication. IMPRESSION: Ultrasound-guided fine-needle aspiration biopsy of enlarging right parotid mass. Electronically Signed   By: Malachy Moan M.D.   On: 01/02/2024 15:04    Assessment & Plan:  .[place] tumor Assessment & Plan: Surgical removal planned due to progressive increase in size with proximity to  multiple important neck vessels.    Obesity, diabetes, and hypertension syndrome (HCC) Assessment & Plan: Managed  with diet per patient preference:   Metformin recommended but deferred by patient. 0zempic discussed but declined. She has maintained an A1c < 6.0 and maintained an intended weight loss (with another 5 lbs lost since  November)t with low GI diet and exercise. Foot exam normal today.  T she has a contraindication ACE/ARB .  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  Advised her to return for labs and see her  cardiologist for cardiac clearance   Lab Results  Component Value Date   HGBA1C 5.4 01/07/2024   Lab Results  Component Value Date   CREATININE 0.97 10/08/2023      Orders: -     CBC with Differential/Platelet; Future -     Basic metabolic panel; Future  Other orders -     amLODIPine Besylate; Take 1 tablet (2.5 mg total) by mouth daily. IN ADDITION TO 10 MG DOSE  Dispense: 90 tablet; Refill: 1     I spent 34 minute  prior relevant surgical and non surgical procedures, recent  labs and imaging studies, counseling on diabetes management,  reviewing the assessment and plan with patient, and post visit ordering and reviewing of  diagnostics and therapeutics with patient  .   Follow-up: Return in about 3 months (around 04/07/2024) for follow up diabetes, chronic pain management.   Sherlene Shams, MD

## 2024-01-07 NOTE — Patient Instructions (Addendum)
 I'm glad the tumor was BENIGN.  You have lost another 5 lbs and your A1c is excellent!      I will refill your hydrocodone for another 3 months  We will repeat your labs at your next visit

## 2024-01-07 NOTE — Assessment & Plan Note (Signed)
 Surgical removal planned due to progressive increase in size with proximity to  multiple important neck vessels.

## 2024-01-11 ENCOUNTER — Other Ambulatory Visit: Payer: Self-pay | Admitting: Internal Medicine

## 2024-01-12 ENCOUNTER — Other Ambulatory Visit: Payer: Self-pay | Admitting: Internal Medicine

## 2024-01-12 MED ORDER — HYDROCODONE-ACETAMINOPHEN 10-325 MG PO TABS: 1.0000 | ORAL_TABLET | Freq: Four times a day (QID) | ORAL | 0 refills | Status: DC | PRN

## 2024-01-12 NOTE — Telephone Encounter (Signed)
 Copied from CRM [id number]. Topic: Clinical - Medication Refill >> Jan 12, 2024  9:01 AM Truddie Crumble wrote: Most Recent Primary Care Visit:  Provider: Sherlene Shams  Department: [facility]  Visit Type: OFFICE VISIT  Date: 01/07/2024  Medication: HYDROcodone-acetaminophen (NORCO) 10-325 MG tablet  Has the patient contacted their pharmacy? Yes (Agent: If no, request that the patient contact the pharmacy for the refill. If patient does not wish to contact the pharmacy document the reason why and proceed with request.) (Agent: If yes, when and what did the pharmacy advise?)  Is this the correct pharmacy for this prescription? Yes If no, delete pharmacy and type the correct one.  This is the patient's preferred pharmacy:  [id number] - [place], [place] - [address] AT [facility] [address] [place] [address] [place] [address] Phone: [phone] Fax: [phone]   Has the prescription been filled recently? Yes  Is the patient out of the medication? Yes  Has the patient been seen for an appointment in the last year OR does the patient have an upcoming appointment? Yes  Can we respond through MyChart? Yes  Agent: Please be advised that Rx refills may take up to 3 business days. We ask that you follow-up with your pharmacy.

## 2024-01-19 ENCOUNTER — Encounter: Payer: Self-pay | Admitting: Internal Medicine

## 2024-01-19 DIAGNOSIS — Z01818 Encounter for other preprocedural examination: Secondary | ICD-10-CM | POA: Insufficient documentation

## 2024-01-19 NOTE — Addendum Note (Signed)
 Addended by: Sherlene Shams on: 01/19/2024 01:15 PM   Modules accepted: Orders

## 2024-01-19 NOTE — Assessment & Plan Note (Addendum)
 Preoperative medical clearance has been requested today.  Advised her to return for labs and see her cardiologist for cardiac clearance   Lab Results  Component Value Date   HGBA1C 5.4 01/07/2024   Lab Results  Component Value Date   CREATININE 0.97 10/08/2023

## 2024-01-20 ENCOUNTER — Telehealth: Payer: Self-pay

## 2024-01-20 NOTE — Telephone Encounter (Signed)
 Copied from CRM [id number]. Topic: General - Other >> Jan 20, 2024  9:29 AM Fonda Kinder J wrote: Reason for CRM: [facility] called in to see of a surgical clearance fax had been received for the pt Callback# [place] [phone]

## 2024-01-21 NOTE — Telephone Encounter (Signed)
 Attempted to call. No answer no voicemail. Need to let Lelon Mast know that we have received the surgical clearance form and we are just waiting for pt to have the labs done that Dr. Darrick Huntsman has requested before she will sign the form.

## 2024-01-23 ENCOUNTER — Telehealth: Payer: Self-pay

## 2024-01-23 NOTE — Telephone Encounter (Signed)
 LMTCB. Need to schedule pt for a non fasting lab appt before Dr. Darrick Huntsman can clear her for surgery. Lab orders have been placed.

## 2024-01-27 ENCOUNTER — Encounter: Payer: Self-pay | Admitting: Internal Medicine

## 2024-01-30 NOTE — Telephone Encounter (Signed)
 Called pt was unable to lvm to inform pt of message below  Need to let Suzanne Cole know that we have received the surgical clearance form and we are just waiting for pt to have the labs done that Dr. Darrick Huntsman has requested before she will sign the form

## 2024-01-31 ENCOUNTER — Encounter: Payer: Self-pay | Admitting: Internal Medicine

## 2024-02-02 MED ORDER — FLUTICASONE PROPIONATE 50 MCG/ACT NA SUSP: 2.0000 | Freq: Every day | NASAL | 6 refills | Status: AC

## 2024-02-03 ENCOUNTER — Ambulatory Visit: Payer: Medicare HMO | Attending: Medical | Admitting: Medical

## 2024-02-03 ENCOUNTER — Encounter: Payer: Self-pay | Admitting: Medical

## 2024-02-03 VITALS — BP 150/70 | HR 60 | Ht 59.0 in | Wt 135.0 lb

## 2024-02-03 DIAGNOSIS — Z01810 Encounter for preprocedural cardiovascular examination: Secondary | ICD-10-CM | POA: Diagnosis not present

## 2024-02-03 DIAGNOSIS — Z01818 Encounter for other preprocedural examination: Secondary | ICD-10-CM

## 2024-02-03 DIAGNOSIS — I779 Disorder of arteries and arterioles, unspecified: Secondary | ICD-10-CM

## 2024-02-03 DIAGNOSIS — I2510 Atherosclerotic heart disease of native coronary artery without angina pectoris: Secondary | ICD-10-CM

## 2024-02-03 DIAGNOSIS — I10 Essential (primary) hypertension: Secondary | ICD-10-CM | POA: Diagnosis not present

## 2024-02-03 DIAGNOSIS — E782 Mixed hyperlipidemia: Secondary | ICD-10-CM

## 2024-02-03 MED ORDER — FENOFIBRATE 145 MG PO TABS: 145.0000 mg | ORAL_TABLET | Freq: Every day | ORAL | 3 refills | Status: AC

## 2024-02-03 NOTE — Patient Instructions (Signed)
 Medication Instructions:  Your physician recommends the following medication changes.  START TAKING: Fenofibrate 145 mg by mouth daily  *If you need a refill on your cardiac medications before your next appointment, please call your pharmacy*   Lab Work: No labs ordered today    Testing/Procedures: Your physician has requested that you have a carotid duplex. This test is an ultrasound of the carotid arteries in your neck. It looks at blood flow through these arteries that supply the brain with blood.   Allow one hour for this exam.  There are no restrictions or special instructions.  This will take place at [address] [facility] [address], [place] [address]  Please note: We ask at that you not bring children with you during ultrasound (echo/ vascular) testing. Due to room size and safety concerns, children are not allowed in the ultrasound rooms during exams. Our front office staff cannot provide observation of children in our lobby area while testing is being conducted. An adult accompanying a patient to their appointment will only be allowed in the ultrasound room at the discretion of the ultrasound technician under special circumstances. We apologize for any inconvenience.    Follow-Up: At [facility], you and your health needs are our priority.  As part of our continuing mission to provide you with exceptional heart care, we have created designated Provider Care Teams.  These Care Teams include your primary Cardiologist (physician) and Advanced Practice Providers (APPs -  Physician Assistants and Nurse Practitioners) who all work together to provide you with the care you need, when you need it.  We recommend signing up for the patient portal called "MyChart".  Sign up information is provided on this After Visit Summary.  MyChart is used to connect with patients for Virtual Visits (Telemedicine).  Patients are able to view lab/test results, encounter notes,  upcoming appointments, etc.  Non-urgent messages can be sent to your provider as well.   To learn more about what you can do with MyChart, go to [email or web address].    Your next appointment:   3 month(s)  Provider:   You may see Lorine Bears, MD or one of the following Advanced Practice Providers on your designated Care Team:   Nicolasa Ducking, NP Eula Listen, PA-C Cadence Fransico Michael, PA-C Charlsie Quest, NP Carlos Levering, NP

## 2024-02-03 NOTE — Progress Notes (Signed)
 Cardiology Office Note:  .   Date:  02/03/2024  ID:  Suzanne Cole, DOB 12-23-1950, MRN [id number] PCP: Sherlene Shams, MD  [facility] Providers Cardiologist:  Lorine Bears, MD {    History of Present Illness: Suzanne Cole is a 73 y.o. 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    Underwent PCI/DES to mid LAD in 11/2014 which was 70% stenosed with FFR of 0.67.  She was noted to have CTO of the RCA which was medically managed.  Noted proximal LAD 30% stenosis as well as distal LAD 30% stenosis.  Minor luminal irregularities noted in left circumflex.   Seen 06/2020 and reported exertional DOE. A myoview lexiscan and echo were ordered. MPI showed LVEF >65%, normal study, low risk with evidence of aortic and 3V coronary artery calcifications. Echo showed LVEF 60-65%, G2DD.   The patient was seen 06/19/22 reporting SOB on exertion. Labs came back showing Hgb 6.5 and she was sent to the ER. In the ER Hgb was 5.7. Stool guaiac was negative. B12 was 93 and ferritin was 3. Plavix was stopped. She was transfused 2 units PRBCs and was sent home with B12 and iron. She saw her PCP 8/24 and she was started on B12 injections and iron. He is also following with hematology. Echo showed LVEF 55-60%, no WMA, G1DD. Myoview Lexiscan showed no significant ischemia, small fixed region possible breast attenuation, no EKG changes, overall low risk scan.   The patient was last seen December 2023 and was stable from a cardiac perspective.  Today, the patient is overall doing well. She is here for a pre-op visit. She is going to have resection of Warthin tumor on the right side of her neck, it is benign. She does not have a date for surgery yet. PCP will do blood work.  She denies chest pain, Sob, LLE, orthopnea, pnd, lightheadedness,  dizziness, palpitations, heart racing. She stays active at home.  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 BP is high, but she forgot to take her medications. At home BP is 130/60. She takes amlodipine, Coreg, and hydrochlorothiazide. She is unable to walk 1-2 blocks due to hip pain. Can walk up a flight of stairs. Can do moderate house work. She occasionally uses cane/walker if pain is bad.   Studies Reviewed: [name]   EKG Interpretation Date/Time:  Tuesday February 03 2024 11:30:11 EDT Ventricular Rate:  60 PR Interval:  168 QRS Duration:  80 QT Interval:  432 QTC Calculation: 432 R Axis:   34  Text Interpretation: Normal sinus rhythm Normal ECG When compared with ECG of 20-Jun-2022 17:10, No significant change was found Confirmed by Fransico Michael, Kelani Robart ([id number]) on 02/03/2024 11:39:13 AM    Echo 07/2022  1. Left ventricular ejection fraction, by estimation, is 55 to 60%. The  left ventricle has normal function. The left ventricle has no regional  wall motion abnormalities. There is mild left ventricular hypertrophy.  Left ventricular diastolic parameters  are consistent with Grade I diastolic dysfunction (impaired relaxation).   2. Right ventricular systolic function is normal. The right ventricular  size is normal.   3. The mitral valve is normal in structure. No evidence of mitral valve  regurgitation. No evidence of mitral stenosis.   4. The  aortic valve is tricuspid. Aortic valve regurgitation is not  visualized. Aortic valve sclerosis is present, with no evidence of aortic  valve stenosis.   5. The inferior vena cava is normal in size with greater than 50%  respiratory variability, suggesting right atrial pressure of 3 mmHg.   Comparison(s): 07/11/20-EF 60-65%   MPI 06/2022 Narrative & Impression  Challenging study Pharmacological myocardial perfusion imaging study with no significant  ischemia Small region fixed defect mid to distal anteroseptal wall noted, unable to exclude  breast attenuation artifact Unable to visualize wall motion, ejection fraction not calculated secondary to significant GI uptake artifact No EKG changes concerning for ischemia at peak stress or in recovery. Likely low risk scan, consider alternate imaging study if clinically indicated     Signed, Dossie Arbour, MD, Ph.D [facility]         Physical Exam:   VS:  BP (!) 150/70   Pulse 60   Ht 4\' 11"  (1.499 m)   Wt 135 lb (61.2 kg)   SpO2 97%   BMI 27.27 kg/m    Wt Readings from Last 3 Encounters:  02/03/24 135 lb (61.2 kg)  01/07/24 134 lb 9.6 oz (61.1 kg)  10/08/23 139 lb 2 oz (63.1 kg)    GEN: Well nourished, well developed in no acute distress NECK: No JVD; No carotid bruits CARDIAC: RRR, no murmurs, rubs, gallops RESPIRATORY:  Clear to auscultation without rales, wheezing or rhonchi  ABDOMEN: Soft, non-tender, non-distended EXTREMITIES:  No edema; No deformity   ASSESSMENT AND PLAN: .    CAD with stenting in 2016 Patient denies anginal symptoms. She is not very active at baseline due to right hip pain from arthritis. MPO 06/2022 showed no ischemia, low risk scan.  No further ischemic work-up at this time. Continue ASA 81mg  daily, Coreg 25mg  daily, Crestor 40mg  daily.   HTN BP is high today, but she did not take her medication today. She says BO at home is normally 130/60s. Continue amlodipine 12.5mg  daily (10mg  daily +2.5mg  daily per PCP), Coreg 25mg  daily, and hydrochlorothiazide 25mg  daily.  Carotid artery disease I will update carotid US. Prior US showed 1-39% bilaterally. Continue ASA and Crestor.   HLD LDL 48, TG 245. Continue Crestor 40mg  daily. I will start fenofibrate.   Pre-op Patient is needing resection of tumor on the right side of her neck. No date has been set yet. She has no cardiac symptoms. MPI in 2023 was low risk with no significant ischemia. Echo showed LVEF 55-60%, G1DD. Function is limited by right hip pain. METS>4. According to [facility]CRI she is 6%  risk of MACE. We are updating carotid US and adding fenofibrate, but this will not delay surgery. No further cardiac work-up prior to surgery.  Regarding ASA therapy, we recommend continuation of ASA throughout the perioperative period.  However, if the surgeon feels that cessation of ASA is required in the perioperative period, it may be stopped 5-7 days prior to surgery with a plan to resume it as soon as felt to be feasible from a surgical standpoint in the post-operative period.     Dispo: Follow-up in 3 months  Signed, Joelene Barriere David Stall, PA-C

## 2024-02-04 ENCOUNTER — Ambulatory Visit: Payer: Medicare HMO

## 2024-02-04 DIAGNOSIS — E538 Deficiency of other specified B group vitamins: Secondary | ICD-10-CM | POA: Diagnosis not present

## 2024-02-04 MED ORDER — CYANOCOBALAMIN 1000 MCG/ML IJ SOLN
1000.0000 ug | Freq: Once | INTRAMUSCULAR | Status: AC
Start: 2024-02-04 — End: 2024-02-04
  Administered 2024-02-04: 1000 ug via INTRAMUSCULAR

## 2024-02-04 NOTE — Progress Notes (Signed)
 Patient presented for B 12 injection to left deltoid, patient voiced no concerns nor showed any signs of distress during injection.

## 2024-02-06 DIAGNOSIS — D37030 Neoplasm of uncertain behavior of the parotid salivary glands: Secondary | ICD-10-CM | POA: Diagnosis not present

## 2024-02-09 NOTE — Telephone Encounter (Signed)
 LMTCB

## 2024-02-10 ENCOUNTER — Other Ambulatory Visit: Payer: Self-pay | Admitting: Otolaryngology

## 2024-02-18 NOTE — Telephone Encounter (Signed)
 Spoke with pt and scheduled her for a surgical clearance appt.

## 2024-02-26 ENCOUNTER — Ambulatory Visit (INDEPENDENT_AMBULATORY_CARE_PROVIDER_SITE_OTHER): Admitting: Internal Medicine

## 2024-02-26 ENCOUNTER — Encounter: Payer: Self-pay | Admitting: Internal Medicine

## 2024-02-26 VITALS — BP 130/60 | HR 65 | Ht 59.0 in | Wt 135.0 lb

## 2024-02-26 DIAGNOSIS — Z01818 Encounter for other preprocedural examination: Secondary | ICD-10-CM

## 2024-02-26 DIAGNOSIS — E1169 Type 2 diabetes mellitus with other specified complication: Secondary | ICD-10-CM | POA: Diagnosis not present

## 2024-02-26 DIAGNOSIS — D508 Other iron deficiency anemias: Secondary | ICD-10-CM | POA: Diagnosis not present

## 2024-02-26 DIAGNOSIS — E1159 Type 2 diabetes mellitus with other circulatory complications: Secondary | ICD-10-CM

## 2024-02-26 DIAGNOSIS — I10 Essential (primary) hypertension: Secondary | ICD-10-CM | POA: Diagnosis not present

## 2024-02-26 DIAGNOSIS — I152 Hypertension secondary to endocrine disorders: Secondary | ICD-10-CM

## 2024-02-26 DIAGNOSIS — E669 Obesity, unspecified: Secondary | ICD-10-CM

## 2024-02-26 MED ORDER — AMLODIPINE BESYLATE 2.5 MG PO TABS: 2.5000 mg | ORAL_TABLET | Freq: Every evening | ORAL | 1 refills | Status: AC

## 2024-02-26 MED ORDER — AMLODIPINE BESYLATE 10 MG PO TABS: 10.0000 mg | ORAL_TABLET | Freq: Every morning | ORAL | 3 refills | Status: AC

## 2024-02-26 NOTE — Progress Notes (Signed)
 Subjective:  Patient ID: Suzanne Cole, female    DOB: 12/04/1950  Age: 73 y.o. MRN: [id number]  CC: The primary encounter diagnosis was Obesity, diabetes, and hypertension syndrome (HCC). Diagnoses of Primary hypertension, Other iron  deficiency anemia, and Preoperative clearance were also pertinent to this visit.   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    She has a history of CAD but has had no recent episodes of chest pain and has had a preoperative cardiology evaluation .     She has PAD;  Carotid dopplers scheduled for April  21   She has DM Type 2 which has been historically well controlled.  Last A1c was < 6.0 I Feb 2-25.   She has a history of anemia requiring transfusion remotely.    Lab Results  Component Value Date   WBC 6.1 09/03/2022   HGB 11.9 (L) 09/03/2022   HCT 37.7 09/03/2022   MCV 89.8 09/03/2022   PLT 268 09/03/2022        Outpatient Medications Prior to Visit  Medication Sig Dispense Refill   albuterol  (PROVENTIL ) (2.5 MG/3ML) 0.083% nebulizer solution Take 3 mLs (2.5 mg total) by nebulization every 6 (six) hours as needed for wheezing or shortness of breath. 150 mL 1   albuterol  (VENTOLIN  HFA) 108 (90 Base) MCG/ACT inhaler INHALE 2 PUFFS BY MOUTH EVERY 6 HOURS AS NEEDED FOR WHEEZING OR SHORTNESS OF BREATH 18 g 2   aspirin 81 MG tablet Take 81 mg by mouth as needed for pain.     carvedilol  (COREG ) 25 MG tablet TAKE 1 TABLET(25 MG) BY MOUTH TWICE DAILY 180 tablet 3   cetirizine (ZYRTEC) 10 MG tablet Take 10 mg by mouth daily.     Cholecalciferol (VITAMIN D3) 1000 units CHEW Chew 1 tablet by mouth daily.     fenofibrate  (TRICOR ) 145 MG tablet Take 1 tablet (145 mg total) by mouth daily. 90 tablet 3   Ferrous Sulfate (IRON ) 142 (45 Fe) MG TBCR Take 1 tablet by  mouth daily. 30 tablet 1   fluticasone  (FLONASE ) 50 MCG/ACT nasal spray Place 2 sprays into both nostrils daily. 16 g 6   gabapentin  (NEURONTIN ) 300 MG capsule TAKE 1 CAPSULE(300 MG) BY MOUTH THREE TIMES DAILY 270 capsule 1   hydrochlorothiazide  (HYDRODIURIL ) 12.5 MG tablet Take 1 tablet (12.5 mg total) by mouth daily. 90 tablet 3   HYDROcodone -acetaminophen  (NORCO) 10-325 MG tablet Take 1 tablet by mouth every 6 (six) hours as needed. 120 tablet 0   HYDROcodone -acetaminophen  (NORCO) 10-325 MG tablet Take 1 tablet by mouth every 6 (six) hours as needed. 120 tablet 0   HYDROcodone -acetaminophen  (NORCO) 10-325 MG tablet Take 1 tablet by mouth every 6 (six) hours as needed. 120 tablet 0   HYDROcodone -acetaminophen  (NORCO) 10-325 MG tablet Take 1 tablet by mouth every 6 (six) hours as needed. 120 tablet 0   methocarbamol  (ROBAXIN ) 750 MG tablet TAKE 1 TABLET(750 MG) BY MOUTH FOUR TIMES DAILY 360 tablet 1   pantoprazole  (PROTONIX ) 40 MG tablet TAKE 1 TABLET(40 MG) BY MOUTH DAILY 90 tablet 3   predniSONE  (DELTASONE ) 10 MG tablet 6 tablets on Day 1 , then reduce by 1 tablet daily until gone 21 tablet 0   rosuvastatin  (CRESTOR ) 40 MG tablet TAKE 1 TABLET(40 MG) BY MOUTH DAILY 90  tablet 3   amLODipine  (NORVASC ) 10 MG tablet TAKE 1 TABLET(10 MG) BY MOUTH AT BEDTIME 90 tablet 3   amLODipine  (NORVASC ) 2.5 MG tablet Take 1 tablet (2.5 mg total) by mouth daily. IN ADDITION TO 10 MG DOSE 90 tablet 1   No facility-administered medications prior to visit.    Review of Systems;  Patient denies headache, fevers, malaise, unintentional weight loss, skin rash, eye pain, sinus congestion and sinus pain, sore throat, dysphagia,  hemoptysis , cough, dyspnea, wheezing, chest pain, palpitations, orthopnea, edema, abdominal pain, nausea, melena, diarrhea, constipation, flank pain, dysuria, hematuria, urinary  Frequency, nocturia, numbness, tingling, seizures,  Focal weakness, Loss of consciousness,  Tremor, insomnia,  depression, anxiety, and suicidal ideation.      Objective:  BP 130/60 (Cuff Size: Normal)   Pulse 65   Ht 4\' 11"  (1.499 m)   Wt 135 lb (61.2 kg)   SpO2 96%   BMI 27.27 kg/m   BP Readings from Last 3 Encounters:  02/26/24 130/60  02/03/24 (!) 150/70  01/07/24 134/60    Wt Readings from Last 3 Encounters:  02/26/24 135 lb (61.2 kg)  02/03/24 135 lb (61.2 kg)  01/07/24 134 lb 9.6 oz (61.1 kg)    Physical Exam Vitals reviewed.  Constitutional:      General: She is not in acute distress.    Appearance: Normal appearance. She is normal weight. She is not ill-appearing, toxic-appearing or diaphoretic.  HENT:     Head: Normocephalic.  Eyes:     General: No scleral icterus.       Right eye: No discharge.        Left eye: No discharge.     Conjunctiva/sclera: Conjunctivae normal.  Neck:      Comments: Non tender neck mass on right  Cardiovascular:     Rate and Rhythm: Normal rate and regular rhythm.     Heart sounds: Normal heart sounds.  Pulmonary:     Effort: Pulmonary effort is normal. No respiratory distress.     Breath sounds: Normal breath sounds.  Musculoskeletal:        General: Normal range of motion.  Skin:    General: Skin is warm and dry.  Neurological:     General: No focal deficit present.     Mental Status: She is alert and oriented to person, place, and time. Mental status is at baseline.  Psychiatric:        Mood and Affect: Mood normal.        Behavior: Behavior normal.        Thought Content: Thought content normal.        Judgment: Judgment normal.    Lab Results  Component Value Date   HGBA1C 5.4 01/07/2024   HGBA1C 5.6 07/10/2023   HGBA1C 5.5 01/07/2023    Lab Results  Component Value Date   CREATININE 0.97 10/08/2023   CREATININE 1.04 07/10/2023   CREATININE 0.96 01/07/2023    Lab Results  Component Value Date   WBC 6.1 09/03/2022   HGB 11.9 (L) 09/03/2022   HCT 37.7 09/03/2022   PLT 268 09/03/2022   GLUCOSE 92 10/08/2023    CHOL 119 07/10/2023   TRIG 245.0 (H) 07/10/2023   HDL 44.00 07/10/2023   LDLDIRECT 48.0 07/10/2023   LDLCALC 31 07/04/2022   ALT 6 10/08/2023   AST 12 10/08/2023   NA 141 10/08/2023   K 4.5 10/08/2023   CL 106 10/08/2023   CREATININE 0.97 10/08/2023   BUN 12  10/08/2023   CO2 28 10/08/2023   TSH 1.060 06/19/2022   INR 1.0 06/20/2022   HGBA1C 5.4 01/07/2024   MICROALBUR 1.4 07/10/2023    US  FNA SALIVARY GLAND/PAROTID GLAND Result Date: 01/02/2024 INDICATION: 73 year old female with enlarging right inferior parotid mass. She presents for fine-needle aspiration biopsy. EXAM: Ultrasound-guided fine-needle aspiration biopsy of parotid mass MEDICATIONS: None. ANESTHESIA/SEDATION: None. COMPLICATIONS: None. PROCEDURE: Informed written consent was obtained from the patient after a thorough discussion of the procedural risks, benefits and alternatives. All questions were addressed. Maximal Sterile Barrier Technique was utilized including caps, mask, sterile gowns, sterile gloves, sterile drape, hand hygiene and skin antiseptic. A timeout was performed prior to the initiation of the procedure. Ultrasound interrogation of the inferior aspect of the right parotid gland demonstrates a large predominantly solid hypoechoic mass with internal vascularity. The overlying skin was sterilely prepped and draped in the standard fashion using chlorhexidine skin prep. Local anesthesia was attained by infiltration with 1% lidocaine . A small dermatotomy was made. Under real-time ultrasound guidance, multiple 25 gauge fine-needle aspiration biopsies were obtained. On-site cytopathology confirmed adequate sampling. Post biopsy ultrasound imaging demonstrates no evidence of complication. IMPRESSION: Ultrasound-guided fine-needle aspiration biopsy of enlarging right parotid mass. Electronically Signed   By: Fernando Hoyer M.D.   On: 01/02/2024 15:04    Assessment & Plan:  .Obesity, diabetes, and hypertension syndrome  (HCC) Assessment & Plan: Managed  with diet per patient preference:   Metformin  recommended but deferred by patient. 0zempic discussed but declined. She has maintained an A1c < 6.0 and maintained an intended weight loss (with another 5 lbs lost since  November)t with low GI diet and exercise. Foot exam normal today.  T she has a contraindication ACE/ARB .  She is taking a high potency statin   Lab Results  Component Value Date   HGBA1C 5.4 01/07/2024    Lab Results  Component Value Date   LABMICR 20.3 07/04/2022   MICROALBUR 1.4 07/10/2023   MICROALBUR 1.0 07/13/2021        Primary hypertension Assessment & Plan:  Home readings have been  labile because she has been adjusting her doses.  Most recent was 130/60.  Continue amlodipine   10 mg qam ., 2.5 mg qpm, carvedilol  25 mg bid,  and hydrochlorothiazide  12.5 mg  ,qam   Orders: -     Basic metabolic panel with GFR; Future  Other iron  deficiency anemia -     Iron , TIBC and Ferritin Panel; Future -     CBC with Differential/Platelet; Future  Preoperative clearance Assessment & Plan: Preoperative medical clearance has been requested today.  Advised her to return for labs to screen for CKD and anemia.  ;; she has been cleared by her cardiologist.    Lab Results  Component Value Date   HGBA1C 5.4 01/07/2024   Lab Results  Component Value Date   CREATININE 0.97 10/08/2023       Other orders -     amLODIPine  Besylate; Take 1 tablet (10 mg total) by mouth every morning.  Dispense: 90 tablet; Refill: 3 -     amLODIPine  Besylate; Take 1 tablet (2.5 mg total) by mouth every evening. IN ADDITION TO 10 MG DOSE  Dispense: 90 tablet; Refill: 1     I spent 34 minutes on the day of this face to face encounter reviewing patient's  most recent visit with cardiology,  nephrology,  and neurology,  prior relevant surgical and non surgical procedures, recent  labs and imaging studies,  counseling on weight management,  reviewing the  assessment and plan with patient, and post visit ordering and reviewing of  diagnostics and therapeutics with patient  .   Follow-up: No follow-ups on file.   Thersia Flax, MD

## 2024-02-26 NOTE — Assessment & Plan Note (Signed)
 Managed  with diet per patient preference:   Metformin recommended but deferred by patient. 0zempic discussed but declined. She has maintained an A1c < 6.0 and maintained an intended weight loss (with another 5 lbs lost since  November)t with low GI diet and exercise. Foot exam normal today.  T she has a contraindication ACE/ARB .  She is taking a high potency statin   Lab Results  Component Value Date   HGBA1C 5.4 01/07/2024    Lab Results  Component Value Date   LABMICR 20.3 07/04/2022   MICROALBUR 1.4 07/10/2023   MICROALBUR 1.0 07/13/2021

## 2024-02-26 NOTE — Patient Instructions (Addendum)
 Please schedule a lab appt at the same time as your RN visit for b12 injection  I will refill your Vicodin until your next visit with me May 27     Take amlodipine   10 mg with carvedilol and the hydrochlorothiazide in the morning  Take amlodipine 2.5 mg  with carvedilol in the evening

## 2024-02-26 NOTE — Assessment & Plan Note (Signed)
 Home readings have been  labile because she has been adjusting her doses.  Most recent was 130/60.  Continue amlodipine  10 mg qam ., 2.5 mg qpm, carvedilol 25 mg bid,  and hydrochlorothiazide 12.5 mg  ,qam

## 2024-02-29 NOTE — Assessment & Plan Note (Signed)
 Preoperative medical clearance has been requested today.  Advised her to return for labs to screen for CKD and anemia.  ;; she has been cleared by her cardiologist.    Lab Results  Component Value Date   HGBA1C 5.4 01/07/2024   Lab Results  Component Value Date   CREATININE 0.97 10/08/2023

## 2024-03-01 ENCOUNTER — Other Ambulatory Visit: Payer: Self-pay

## 2024-03-01 ENCOUNTER — Ambulatory Visit

## 2024-03-01 DIAGNOSIS — I779 Disorder of arteries and arterioles, unspecified: Secondary | ICD-10-CM

## 2024-03-03 ENCOUNTER — Ambulatory Visit (INDEPENDENT_AMBULATORY_CARE_PROVIDER_SITE_OTHER): Payer: Medicare HMO

## 2024-03-03 DIAGNOSIS — I10 Essential (primary) hypertension: Secondary | ICD-10-CM | POA: Diagnosis not present

## 2024-03-03 DIAGNOSIS — E538 Deficiency of other specified B group vitamins: Secondary | ICD-10-CM

## 2024-03-03 DIAGNOSIS — D508 Other iron deficiency anemias: Secondary | ICD-10-CM | POA: Diagnosis not present

## 2024-03-03 MED ORDER — CYANOCOBALAMIN 1000 MCG/ML IJ SOLN
1000.0000 ug | Freq: Once | INTRAMUSCULAR | Status: AC
  Administered 2024-03-03: 1000 ug via INTRAMUSCULAR

## 2024-03-03 NOTE — Progress Notes (Signed)
 Patient presented for B 12 injection to right deltoid, patient voiced no concerns nor showed any signs of distress during injection.

## 2024-03-04 LAB — IRON,TIBC AND FERRITIN PANEL
%SAT: 31 % (ref 16–45)
Ferritin: 98 ng/mL (ref 16–288)
Iron: 102 ug/dL (ref 45–160)
TIBC: 324 ug/dL (ref 250–450)

## 2024-03-04 LAB — BASIC METABOLIC PANEL WITH GFR
BUN: 22 mg/dL (ref 6–23)
CO2: 27 meq/L (ref 19–32)
Calcium: 9.3 mg/dL (ref 8.4–10.5)
Chloride: 101 meq/L (ref 96–112)
Creatinine, Ser: 1.2 mg/dL (ref 0.40–1.20)
GFR: 45.21 mL/min — ABNORMAL LOW
Glucose, Bld: 96 mg/dL (ref 70–99)
Potassium: 4.2 meq/L (ref 3.5–5.1)
Sodium: 138 meq/L (ref 135–145)

## 2024-03-04 LAB — CBC WITH DIFFERENTIAL/PLATELET
Basophils Absolute: 0.1 10*3/uL (ref 0.0–0.1)
Basophils Relative: 1.3 % (ref 0.0–3.0)
Eosinophils Absolute: 0.2 10*3/uL (ref 0.0–0.7)
Eosinophils Relative: 2.8 % (ref 0.0–5.0)
HCT: 40.1 % (ref 36.0–46.0)
Hemoglobin: 13.6 g/dL (ref 12.0–15.0)
Lymphocytes Relative: 29.1 % (ref 12.0–46.0)
Lymphs Abs: 1.9 10*3/uL (ref 0.7–4.0)
MCHC: 33.9 g/dL (ref 30.0–36.0)
MCV: 92 fl (ref 78.0–100.0)
Monocytes Absolute: 0.7 10*3/uL (ref 0.1–1.0)
Monocytes Relative: 10.4 % (ref 3.0–12.0)
Neutro Abs: 3.7 10*3/uL (ref 1.4–7.7)
Neutrophils Relative %: 56.4 % (ref 43.0–77.0)
Platelets: 286 10*3/uL (ref 150.0–400.0)
RBC: 4.36 Mil/uL (ref 3.87–5.11)
RDW: 12.4 % (ref 11.5–15.5)
WBC: 6.6 10*3/uL (ref 4.0–10.5)

## 2024-03-05 ENCOUNTER — Ambulatory Visit
Admission: RE | Admit: 2024-03-05 | Discharge: 2024-03-05 | Disposition: A | Discharge status: Home / Self Care | Source: Ambulatory Visit | Attending: Cardiovascular Disease | Admitting: Cardiovascular Disease

## 2024-03-05 DIAGNOSIS — I779 Disorder of arteries and arterioles, unspecified: Secondary | ICD-10-CM | POA: Diagnosis not present

## 2024-03-05 DIAGNOSIS — I6523 Occlusion and stenosis of bilateral carotid arteries: Secondary | ICD-10-CM | POA: Diagnosis not present

## 2024-03-05 DIAGNOSIS — I2510 Atherosclerotic heart disease of native coronary artery without angina pectoris: Secondary | ICD-10-CM | POA: Diagnosis not present

## 2024-03-07 ENCOUNTER — Encounter: Payer: Self-pay | Admitting: Internal Medicine

## 2024-03-09 ENCOUNTER — Encounter
Admission: RE | Admit: 2024-03-09 | Discharge: 2024-03-09 | Disposition: A | Discharge status: Home / Self Care | Source: Ambulatory Visit | Attending: Otolaryngology | Admitting: Otolaryngology

## 2024-03-09 ENCOUNTER — Other Ambulatory Visit: Payer: Self-pay

## 2024-03-09 NOTE — Patient Instructions (Addendum)
 Your procedure is scheduled on: Wednesday 03/17/24 To find out your arrival time, please call [phone] between 1PM - 3PM on:  Tuesday 03/16/24  Report to the Registration Desk on the 1st floor of the [facility]. FREE Valet parking is available.  If your arrival time is 6:00 am, do not arrive before that time as the [facility] entrance doors do not open until 6:00 am.  REMEMBER: Instructions that are not followed completely may result in serious medical risk, up to and including death; or upon the discretion of your surgeon and anesthesiologist your surgery may need to be rescheduled.  Do not eat food or drink any liquids after midnight the night before surgery.  No gum chewing or hard candies.  One week prior to surgery: Stop Anti-inflammatories (NSAIDS) such as Advil, Aleve, Ibuprofen, Motrin, Naproxen, Naprosyn and Aspirin based products such as Excedrin, Goody's Powder, BC Powder. You may however, continue to take Tylenol  if needed for pain up until the day of surgery.  Stop ANY OVER THE COUNTER supplements until after surgery. You may continue your iron   Continue taking all prescribed medications with the exception of the following: hold the Fenofibrate  and Aspirin until after your procedure  TAKE ONLY THESE MEDICATIONS THE MORNING OF SURGERY WITH A SIP OF WATER:  amLODipine  (NORVASC ) 10 MG tablet  carvedilol  (COREG ) 25 MG tablet  HYDROcodone -acetaminophen  (NORCO) 10-325 MG tablet  methocarbamol  (ROBAXIN ) 750 MG tablet  pantoprazole  (PROTONIX ) 40 MG tablet Antacid (take one the night before and one on the morning of surgery - helps to prevent nausea after surgery.)  Use inhalers on the day of surgery and bring to the hospital.  No Alcohol for 24 hours before or after surgery.  No Smoking including e-cigarettes for 24 hours before surgery.  No chewable tobacco products for at least 6 hours before surgery.  No nicotine patches on the day of surgery.  Do not use any  "recreational" drugs for at least a week (preferably 2 weeks) before your surgery.  Please be advised that the combination of cocaine and anesthesia may have negative outcomes, up to and including death. If you test positive for cocaine, your surgery will be cancelled.  On the morning of surgery brush your teeth with toothpaste and water, you may rinse your mouth with mouthwash if you wish. Do not swallow any toothpaste or mouthwash.  Use CHG Soap or wipes as directed on instruction sheet. Regular shower the morning of surgery  Do not wear lotions, powders, or perfumes.   Do not shave body hair from the neck down 48 hours before surgery.  Wear comfortable clothing (specific to your surgery type) to the hospital.  Do not wear jewelry, make-up, hairpins, clips or nail polish.  For welded (permanent) jewelry: bracelets, anklets, waist bands, etc.  Please have this removed prior to surgery.  If it is not removed, there is a chance that hospital personnel will need to cut it off on the day of surgery. Contact lenses, hearing aids and dentures may not be worn into surgery.  Do not bring valuables to the hospital. [facility] is not responsible for any missing/lost belongings or valuables.   Notify your doctor if there is any change in your medical condition (cold, fever, infection).  If you are being discharged the day of surgery, you will not be allowed to drive home. You will need a responsible individual to drive you home and stay with you for 24 hours after surgery.   If you  are taking public transportation, you will need to have a responsible individual with you.  If you are being admitted to the hospital overnight, leave your suitcase in the car. After surgery it may be brought to your room.  In case of increased patient census, it may be necessary for you, the patient, to continue your postoperative care in the Same Day Surgery department.  After surgery, you can help prevent lung  complications by doing breathing exercises.  Take deep breaths and cough every 1-2 hours. Your doctor may order a device called an Incentive Spirometer to help you take deep breaths. When coughing or sneezing, hold a pillow firmly against your incision with both hands. This is called "splinting." Doing this helps protect your incision. It also decreases belly discomfort.  Surgery Visitation Policy:  Patients undergoing a surgery or procedure may have two family members or support persons with them as long as the person is not COVID-19 positive or experiencing its symptoms.   Please call the Pre-admissions Testing Dept. at [phone] if you have any questions about these instructions.

## 2024-03-09 NOTE — Pre-Procedure Instructions (Signed)
 Cardiology clearance note in media with ASA instructions noted under 03/17/24 date of surgery.

## 2024-03-11 ENCOUNTER — Other Ambulatory Visit: Payer: Self-pay | Admitting: Internal Medicine

## 2024-03-11 ENCOUNTER — Encounter: Payer: Self-pay | Admitting: Internal Medicine

## 2024-03-11 MED ORDER — HYDROCODONE-ACETAMINOPHEN 10-325 MG PO TABS: 1.0000 | ORAL_TABLET | Freq: Four times a day (QID) | ORAL | 0 refills | Status: DC | PRN

## 2024-03-11 NOTE — Telephone Encounter (Signed)
 Refilled: 01/12/2024 Last OV: 02/26/2024 Next OV: 04/06/2024

## 2024-03-11 NOTE — Telephone Encounter (Unsigned)
 Copied from CRM [id number]. Topic: Clinical - Medication Refill >> Mar 11, 2024  9:42 AM Allyne Areola wrote: Most Recent Primary Care Visit:  Provider: [place], JENATE  Department: [facility]  Visit Type: CLINICAL SUPPORT  Date: 03/03/2024  Medication: HYDROcodone -acetaminophen  (NORCO) 10-325 MG tablet  Has the patient contacted their pharmacy? Yes, they asked patient to contact primary care provider (Agent: If no, request that the patient contact the pharmacy for the refill. If patient does not wish to contact the pharmacy document the reason why and proceed with request.) (Agent: If yes, when and what did the pharmacy advise?)  Is this the correct pharmacy for this prescription? Yes If no, delete pharmacy and type the correct one.  This is the patient's preferred pharmacy:  [id number] - [place], [place] - [address] AT [facility] OF [address] [place] [address] Phone: [phone] Fax: [phone]   Has the prescription been filled recently? No  Is the patient out of the medication? No  Has the patient been seen for an appointment in the last year OR does the patient have an upcoming appointment? Yes  Can we respond through MyChart? Yes  Agent: Please be advised that Rx refills may take up to 3 business days. We ask that you follow-up with your pharmacy.

## 2024-03-12 ENCOUNTER — Encounter: Payer: Self-pay | Admitting: Otolaryngology
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
## 2024-03-13 MED ORDER — HYDROCODONE-ACETAMINOPHEN 10-325 MG PO TABS: 1.0000 | ORAL_TABLET | Freq: Four times a day (QID) | ORAL | 0 refills | Status: DC | PRN

## 2024-03-16 ENCOUNTER — Encounter: Payer: Self-pay | Admitting: Otolaryngology

## 2024-03-17 ENCOUNTER — Other Ambulatory Visit: Payer: Self-pay

## 2024-03-17 ENCOUNTER — Ambulatory Visit
Admission: RE | Admit: 2024-03-17 | Discharge: 2024-03-17 | Disposition: A | Discharge status: Home / Self Care | Attending: Otolaryngology | Admitting: Otolaryngology

## 2024-03-17 ENCOUNTER — Ambulatory Visit: Payer: Self-pay | Admitting: Urgent Care

## 2024-03-17 ENCOUNTER — Encounter
Admission: RE | Disposition: A | Discharge status: Home / Self Care | Payer: Self-pay | Source: Home / Self Care | Attending: Otolaryngology

## 2024-03-17 ENCOUNTER — Encounter: Payer: Self-pay | Admitting: Otolaryngology

## 2024-03-17 DIAGNOSIS — D110 Benign neoplasm of parotid gland: Secondary | ICD-10-CM | POA: Insufficient documentation

## 2024-03-17 DIAGNOSIS — Z87891 Personal history of nicotine dependence: Secondary | ICD-10-CM | POA: Diagnosis not present

## 2024-03-17 DIAGNOSIS — I110 Hypertensive heart disease with heart failure: Secondary | ICD-10-CM | POA: Diagnosis not present

## 2024-03-17 DIAGNOSIS — E119 Type 2 diabetes mellitus without complications: Secondary | ICD-10-CM | POA: Insufficient documentation

## 2024-03-17 DIAGNOSIS — I700 Atherosclerosis of aorta: Secondary | ICD-10-CM | POA: Diagnosis not present

## 2024-03-17 DIAGNOSIS — I509 Heart failure, unspecified: Secondary | ICD-10-CM | POA: Diagnosis not present

## 2024-03-17 DIAGNOSIS — D37030 Neoplasm of uncertain behavior of the parotid salivary glands: Secondary | ICD-10-CM | POA: Diagnosis not present

## 2024-03-17 DIAGNOSIS — I2510 Atherosclerotic heart disease of native coronary artery without angina pectoris: Secondary | ICD-10-CM | POA: Diagnosis not present

## 2024-03-17 DIAGNOSIS — D119 Benign neoplasm of major salivary gland, unspecified: Secondary | ICD-10-CM

## 2024-03-17 HISTORY — DX: Personal history of nicotine dependence: Z87.891

## 2024-03-17 HISTORY — DX: Unilateral inguinal hernia, without obstruction or gangrene, not specified as recurrent: K40.90

## 2024-03-17 HISTORY — DX: Long term (current) use of aspirin: Z79.82

## 2024-03-17 HISTORY — PX: CONTINUOUS NERVE MONITORING: SHX6650

## 2024-03-17 HISTORY — DX: Other specified postprocedural states: Z98.890

## 2024-03-17 HISTORY — DX: Atherosclerosis of aorta: I70.0

## 2024-03-17 HISTORY — DX: Long term (current) use of opiate analgesic: Z79.891

## 2024-03-17 HISTORY — DX: Other diseases of salivary glands: K11.8

## 2024-03-17 HISTORY — DX: Occlusion and stenosis of bilateral carotid arteries: I65.23

## 2024-03-17 HISTORY — PX: PAROTIDECTOMY: SHX2163

## 2024-03-17 SURGERY — EXCISION, PAROTID GLAND
Anesthesia: General | Site: Neck | Laterality: Right

## 2024-03-17 MED ORDER — LIDOCAINE HCL (CARDIAC) PF 100 MG/5ML IV SOSY
PREFILLED_SYRINGE | INTRAVENOUS | Status: DC | PRN
  Administered 2024-03-17: 100 mg via INTRAVENOUS

## 2024-03-17 MED ORDER — KETAMINE HCL 50 MG/5ML IJ SOSY
PREFILLED_SYRINGE | INTRAMUSCULAR | Status: AC
  Filled 2024-03-17: qty 5

## 2024-03-17 MED ORDER — DEXAMETHASONE SODIUM PHOSPHATE 10 MG/ML IJ SOLN
INTRAMUSCULAR | Status: AC
  Filled 2024-03-17: qty 1

## 2024-03-17 MED ORDER — KETAMINE HCL 50 MG/5ML IJ SOSY
PREFILLED_SYRINGE | INTRAMUSCULAR | Status: DC | PRN
  Administered 2024-03-17: 20 mg via INTRAVENOUS
  Administered 2024-03-17: 10 mg via INTRAVENOUS

## 2024-03-17 MED ORDER — BACITRACIN-NEOMYCIN-POLYMYXIN OINTMENT TUBE: TOPICAL_OINTMENT | CUTANEOUS | Status: DC | PRN

## 2024-03-17 MED ORDER — EPHEDRINE 5 MG/ML INJ
INTRAVENOUS | Status: AC
  Filled 2024-03-17: qty 5

## 2024-03-17 MED ORDER — GLYCOPYRROLATE 0.2 MG/ML IJ SOLN
INTRAMUSCULAR | Status: DC | PRN
  Administered 2024-03-17: .2 mg via INTRAVENOUS

## 2024-03-17 MED ORDER — PROPOFOL 10 MG/ML IV BOLUS
INTRAVENOUS | Status: DC | PRN
  Administered 2024-03-17: 100 mg via INTRAVENOUS

## 2024-03-17 MED ORDER — LIDOCAINE-EPINEPHRINE (PF) 1 %-1:200000 IJ SOLN
INTRAMUSCULAR | Status: AC
  Filled 2024-03-17: qty 30

## 2024-03-17 MED ORDER — EPHEDRINE 5 MG/ML INJ
INTRAVENOUS | Status: AC
Start: 2024-03-17 — End: ?
  Filled 2024-03-17: qty 5

## 2024-03-17 MED ORDER — DEXAMETHASONE SODIUM PHOSPHATE 10 MG/ML IJ SOLN
INTRAMUSCULAR | Status: DC | PRN
  Administered 2024-03-17: 10 mg via INTRAVENOUS

## 2024-03-17 MED ORDER — LIDOCAINE HCL (PF) 2 % IJ SOLN
INTRAMUSCULAR | Status: AC
Start: 2024-03-17 — End: ?
  Filled 2024-03-17: qty 5

## 2024-03-17 MED ORDER — SODIUM CHLORIDE 0.9 % IV SOLN: INTRAVENOUS | Status: DC

## 2024-03-17 MED ORDER — PROPOFOL 10 MG/ML IV BOLUS
INTRAVENOUS | Status: AC
  Filled 2024-03-17: qty 20

## 2024-03-17 MED ORDER — GLYCOPYRROLATE 0.2 MG/ML IJ SOLN
INTRAMUSCULAR | Status: AC
  Filled 2024-03-17: qty 1

## 2024-03-17 MED ORDER — ORAL CARE MOUTH RINSE: 15.0000 mL | Freq: Once | OROMUCOSAL | Status: AC

## 2024-03-17 MED ORDER — PHENYLEPHRINE 80 MCG/ML (10ML) SYRINGE FOR IV PUSH (FOR BLOOD PRESSURE SUPPORT)
PREFILLED_SYRINGE | INTRAVENOUS | Status: DC | PRN
  Administered 2024-03-17 (×5): 80 ug via INTRAVENOUS

## 2024-03-17 MED ORDER — FENTANYL CITRATE (PF) 100 MCG/2ML IJ SOLN
INTRAMUSCULAR | Status: AC
Start: 2024-03-17 — End: ?
  Filled 2024-03-17: qty 2

## 2024-03-17 MED ORDER — CHLORHEXIDINE GLUCONATE 0.12 % MT SOLN
OROMUCOSAL | Status: AC
  Filled 2024-03-17: qty 15

## 2024-03-17 MED ORDER — EPHEDRINE SULFATE-NACL 50-0.9 MG/10ML-% IV SOSY
PREFILLED_SYRINGE | INTRAVENOUS | Status: DC | PRN
  Administered 2024-03-17 (×3): 5 mg via INTRAVENOUS

## 2024-03-17 MED ORDER — SUCCINYLCHOLINE CHLORIDE 200 MG/10ML IV SOSY
PREFILLED_SYRINGE | INTRAVENOUS | Status: DC | PRN
  Administered 2024-03-17: 120 mg via INTRAVENOUS

## 2024-03-17 MED ORDER — OXYCODONE HCL 5 MG/5ML PO SOLN: 5.0000 mg | Freq: Once | ORAL | Status: DC | PRN

## 2024-03-17 MED ORDER — LIDOCAINE HCL (PF) 2 % IJ SOLN
INTRAMUSCULAR | Status: AC
  Filled 2024-03-17: qty 5

## 2024-03-17 MED ORDER — SUCCINYLCHOLINE CHLORIDE 200 MG/10ML IV SOSY
PREFILLED_SYRINGE | INTRAVENOUS | Status: AC
  Filled 2024-03-17: qty 10

## 2024-03-17 MED ORDER — ACETAMINOPHEN 10 MG/ML IV SOLN
INTRAVENOUS | Status: DC | PRN
  Administered 2024-03-17: 1000 mg via INTRAVENOUS

## 2024-03-17 MED ORDER — CHLORHEXIDINE GLUCONATE 0.12 % MT SOLN
15.0000 mL | Freq: Once | OROMUCOSAL | Status: AC
  Administered 2024-03-17: 15 mL via OROMUCOSAL

## 2024-03-17 MED ORDER — ONDANSETRON HCL 4 MG/2ML IJ SOLN
INTRAMUSCULAR | Status: DC | PRN
  Administered 2024-03-17: 4 mg via INTRAVENOUS

## 2024-03-17 MED ORDER — MIDAZOLAM HCL 2 MG/2ML IJ SOLN
INTRAMUSCULAR | Status: DC | PRN
  Administered 2024-03-17: 2 mg via INTRAVENOUS

## 2024-03-17 MED ORDER — FENTANYL CITRATE (PF) 100 MCG/2ML IJ SOLN
INTRAMUSCULAR | Status: DC | PRN
  Administered 2024-03-17: 50 ug via INTRAVENOUS

## 2024-03-17 MED ORDER — PHENYLEPHRINE HCL-NACL 20-0.9 MG/250ML-% IV SOLN
INTRAVENOUS | Status: AC
  Filled 2024-03-17: qty 250

## 2024-03-17 MED ORDER — ONDANSETRON HCL 4 MG/2ML IJ SOLN
INTRAMUSCULAR | Status: AC
  Filled 2024-03-17: qty 2

## 2024-03-17 MED ORDER — LIDOCAINE-EPINEPHRINE (PF) 1 %-1:200000 IJ SOLN
INTRAMUSCULAR | Status: DC | PRN
  Administered 2024-03-17: 3 mL

## 2024-03-17 MED ORDER — 0.9 % SODIUM CHLORIDE (POUR BTL) OPTIME
TOPICAL | Status: DC | PRN
  Administered 2024-03-17: 120 mL

## 2024-03-17 MED ORDER — OXYCODONE HCL 5 MG PO TABS: 5.0000 mg | ORAL_TABLET | Freq: Once | ORAL | Status: DC | PRN

## 2024-03-17 MED ORDER — HYDROMORPHONE HCL 1 MG/ML IJ SOLN: 0.2500 mg | INTRAMUSCULAR | Status: DC | PRN

## 2024-03-17 MED ORDER — TRIPLE ANTIBIOTIC 3.5-400-5000 EX OINT
TOPICAL_OINTMENT | CUTANEOUS | Status: AC
  Filled 2024-03-17: qty 1

## 2024-03-17 MED ORDER — MIDAZOLAM HCL 2 MG/2ML IJ SOLN
INTRAMUSCULAR | Status: AC
  Filled 2024-03-17: qty 2

## 2024-03-17 MED ORDER — PHENYLEPHRINE HCL-NACL 20-0.9 MG/250ML-% IV SOLN
INTRAVENOUS | Status: DC | PRN
Start: 2024-03-17 — End: 2024-03-17
  Administered 2024-03-17: 80 ug/min via INTRAVENOUS

## 2024-03-17 MED ORDER — ACETAMINOPHEN 10 MG/ML IV SOLN
INTRAVENOUS | Status: AC
  Filled 2024-03-17: qty 100

## 2024-03-17 SURGICAL SUPPLY — 37 items
ADHESIVE MASTISOL STRL (MISCELLANEOUS) ×2 IMPLANT
ATTRACTOMAT 16X20 MAGNETIC DRP (DRAPES) ×2 IMPLANT
BLADE SURG 15 STRL LF DISP TIS (BLADE) ×2 IMPLANT
CORD BIP STRL DISP 12FT (MISCELLANEOUS) ×2 IMPLANT
COTTON BALL STERILE 4 PK (GAUZE/BANDAGES/DRESSINGS) ×2 IMPLANT
DRAIN WOUND RND W/TROCAR (DRAIN) IMPLANT
DRAPE SURG 17X11 SM STRL (DRAPES) ×4 IMPLANT
DRSG TEGADERM 2-3/8X2-3/4 SM (GAUZE/BANDAGES/DRESSINGS) ×8 IMPLANT
DRSG TEGADERM 4X4.75 (GAUZE/BANDAGES/DRESSINGS) ×2 IMPLANT
DRSG TELFA 3X4 N-ADH STERILE (GAUZE/BANDAGES/DRESSINGS) ×2 IMPLANT
ELECTRODE CAUTERY BLDE TIP 2.5 (TIP) ×2 IMPLANT
ELECTRODE EMG 20MM DUAL (MISCELLANEOUS) ×4 IMPLANT
ELECTRODE NDL 20X.3 GREEN (MISCELLANEOUS) ×2 IMPLANT
ELECTRODE NEEDLE 20X.3 GREEN (MISCELLANEOUS) ×2 IMPLANT
ELECTRODE REM PT RTRN 9FT ADLT (ELECTROSURGICAL) ×2 IMPLANT
EVACUATOR SILICONE 100CC (DRAIN) IMPLANT
FORCEPS JEWEL BIP 4-3/4 STR (INSTRUMENTS) ×2 IMPLANT
GAUZE 4X4 16PLY ~~LOC~~+RFID DBL (SPONGE) ×2 IMPLANT
GAUZE SPONGE 4X4 12PLY STRL (GAUZE/BANDAGES/DRESSINGS) ×2 IMPLANT
GLOVE BIO SURGEON STRL SZ7.5 (GLOVE) ×4 IMPLANT
GOWN STRL REUS W/ TWL LRG LVL3 (GOWN DISPOSABLE) ×6 IMPLANT
HOOK STAY BLUNT/RETRACTOR 5M (MISCELLANEOUS) IMPLANT
KIT TURNOVER KIT A (KITS) ×2 IMPLANT
LABEL OR SOLS (LABEL) ×2 IMPLANT
MANIFOLD NEPTUNE II (INSTRUMENTS) ×2 IMPLANT
MARKER SKIN DUAL TIP RULER LAB (MISCELLANEOUS) ×2 IMPLANT
PACK HEAD/NECK (MISCELLANEOUS) ×2 IMPLANT
PROBE MONO 100X0.75 ELECT 1.9M (MISCELLANEOUS) ×2 IMPLANT
SHEARS HARMONIC 9CM CVD (BLADE) ×2 IMPLANT
SOLUTION PREP PVP 2OZ (MISCELLANEOUS) ×2 IMPLANT
SPONGE KITTNER 5P (MISCELLANEOUS) ×4 IMPLANT
SUT PROLENE 5 0 PS 3 (SUTURE) ×2 IMPLANT
SUT SILK 2-0 18XBRD TIE 12 (SUTURE) ×4 IMPLANT
SUT SILK 4-0 18XBRD TIE 12 (SUTURE) ×4 IMPLANT
SUT VIC AB 4-0 RB1 18 (SUTURE) ×2 IMPLANT
SUTURE EHLN 3-0 FS-10 30 BLK (SUTURE) IMPLANT
TRAP FLUID SMOKE EVACUATOR (MISCELLANEOUS) ×2 IMPLANT

## 2024-03-17 NOTE — Transfer of Care (Signed)
 Immediate Anesthesia Transfer of Care Note  Patient: Suzanne Cole  Procedure(s) Performed: EXCISION, PAROTID GLAND (Right: Neck) NERVE MONITORING, INTRAOPERATIVE (Neck)  Patient Location: PACU  Anesthesia Type:General  Level of Consciousness: awake, alert , and drowsy  Airway & Oxygen Therapy: Patient Spontanous Breathing and Patient connected to face mask oxygen  Post-op Assessment: Report given to RN and Post -op Vital signs reviewed and stable  Post vital signs: Reviewed and stable  Last Vitals:  Vitals Value Taken Time  BP 138/52 03/17/24 1040  Temp 35.9 1041  Pulse 69 03/17/24 1044  Resp 18 03/17/24 1044  SpO2 100 % 03/17/24 1044  Vitals shown include unfiled device data.  Last Pain:  Vitals:   03/17/24 0647  TempSrc: Temporal  PainSc: 0-No pain         Complications: No notable events documented.

## 2024-03-17 NOTE — Op Note (Signed)
 03/17/2024  10:32 AM    Suzanne Cole  [id number]   Pre-Op Diagnosis:  NEOPLASM UNCERTAIN BEHAVIOR, RIGHT PAROTID GLAND  Post-op Diagnosis: NEOPLASM UNCERTAIN BEHAVIOR, RIGHT PAROTID GLAND  Procedure:   Right Superficial Parotidectomy  Surgeon:  Marjean Sierras  Assistant: Lesly Raspberry  Anesthesia:  General endotracheal anesthesia  EBL:  less than 20 cc  Complications:  None  Findings: 3.5 cm right tail of parotid mass with additional < 1 cm nodule palpable in gland superior to main mass  Procedure: The patient was taken to the Operating Room and placed in the supine position.  After induction of general endotracheal anesthesia, the patient was turned 90 degrees and placed on a shoulder roll. The skin was injected along the proposed incision with 1% lidocaine  with epinephrine , 1:200,000. The facial nerve monitor electrodes were placed in the usual fashion at the lower lip and brow on the same side of the procedure. Proper functioning of the nerve monitor was assessed. The area was then prepped and draped in the usual sterile fashion.   A 15 blade was then used to incise the skin from just in front of the right tragus, curving below the earlobe, and curving into the neck along an upper neck crease. . The dissection was carried down to the subcutaneous tissues with the harmonic scalpel and through the platysma muscle, exposing the anterior belly of the sternocleidomastoid muscle. More superiorly the dissection proceeded along the tragal cartilage and down towards the mastoid tip. The dissection proceeded inferiorly to expose the digastric muscle. Next a skin flap was elevated just superficial to the fascia over the parotid gland, widely exposing the gland. Dissection then proceeded deeper, dissecting down to the region of the stylomastoid notch, dividing soft tissues with the Harmonic scalpel. The facial nerve was then identified at this point and confirmed with the nerve stimulator.  Dissection then proceeded along the nerve anteriorly, identifying the pes and then dissecting along the superior and inferior branches of the nerve, dividing parotid tissue above the nerve with the Harmonic scalpel.  Intervening parotid tissue between branches was divided with the Harmonic scalpel. A cuff of parotid tissue was removed superior to the main mass where there was a smaller palpable nodule that was also noted on CT. Once the dissection had proceeded anterior to the mass of abnormal parotid, the gland was then divided anterior to the mass, avoiding injury to the dissected nerve branches. The specimen was delivered and sent for pathology. The nerve was stimulated and proper function confirmed.   The wound was irrigated with saline and hemostasis obtained. A #10 JP drain was placed through a separate stab incision in the skin posterior and inferior to the incision in the neck, and secured with a 5-0 prolene suture. The subcutaneous tissues were then closed with 4-0 Vicryl suture in an interrupted fashion. The skin was closed with 5-0 Prolene suture in a running locked stitch. Bacitracin ointment was applied to the wound.   The patient was then returned to the anesthesiologist for awakening, and was taken to the Recovery Room in stable condition.  Disposition:   PACU then discharge home  Plan: Observe in PACU then discharge home with drain in place. Follow-up tomorrow in office for drain removal. Ice to wound as needed for swelling. Take pain medications as prescribed.    Marjean Sierras 03/17/2024 10:32 AM

## 2024-03-17 NOTE — Anesthesia Procedure Notes (Signed)
 Procedure Name: Intubation Date/Time: 03/17/2024 8:38 AM  Performed by: Ellwood Haber, CRNAPre-anesthesia Checklist: Patient identified, Patient being monitored, Timeout performed, Emergency Drugs available and Suction available Patient Re-evaluated:Patient Re-evaluated prior to induction Oxygen Delivery Method: Circle system utilized Preoxygenation: Pre-oxygenation with 100% oxygen Induction Type: IV induction Ventilation: Mask ventilation without difficulty Laryngoscope Size: 3 and McGrath Grade View: Grade I Tube type: Oral Tube size: 6.5 mm Number of attempts: 1 Airway Equipment and Method: Stylet Placement Confirmation: ETT inserted through vocal cords under direct vision, positive ETCO2 and breath sounds checked- equal and bilateral Secured at: 21 cm Tube secured with: Tape Dental Injury: Teeth and Oropharynx as per pre-operative assessment  Comments: Smooth atraumatic intubation, no complications noted.
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
## 2024-03-17 NOTE — Anesthesia Postprocedure Evaluation (Signed)
 Anesthesia Post Note  Patient: Tyreanna A Holsapple  Procedure(s) Performed: EXCISION, PAROTID GLAND (Right: Neck) NERVE MONITORING, INTRAOPERATIVE (Neck)  Patient location during evaluation: Endoscopy Anesthesia Type: General Level of consciousness: awake and alert Pain management: pain level controlled Vital Signs Assessment: post-procedure vital signs reviewed and stable Respiratory status: spontaneous breathing, nonlabored ventilation, respiratory function stable and patient connected to nasal cannula oxygen Cardiovascular status: blood pressure returned to baseline and stable Postop Assessment: no apparent nausea or vomiting Anesthetic complications: no   No notable events documented.   Last Vitals:  Vitals:   03/17/24 1136 03/17/24 1144  BP:  (!) 143/62  Pulse: 70 62  Resp: 20 15  Temp:  36.9 C  SpO2: 97% 95%    Last Pain:  Vitals:   03/17/24 1144  TempSrc: Temporal  PainSc: 0-No pain                 Nancey Awkward

## 2024-03-18 ENCOUNTER — Encounter: Payer: Self-pay | Admitting: Otolaryngology

## 2024-03-18 LAB — SURGICAL PATHOLOGY
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
## 2024-04-06 ENCOUNTER — Encounter: Payer: Self-pay | Admitting: Internal Medicine

## 2024-04-06 ENCOUNTER — Ambulatory Visit (INDEPENDENT_AMBULATORY_CARE_PROVIDER_SITE_OTHER): Payer: Medicare HMO | Admitting: Internal Medicine

## 2024-04-06 VITALS — BP 130/60 | HR 63 | Ht 59.0 in | Wt 133.0 lb

## 2024-04-06 DIAGNOSIS — N183 Chronic kidney disease, stage 3 unspecified: Secondary | ICD-10-CM

## 2024-04-06 DIAGNOSIS — I130 Hypertensive heart and chronic kidney disease with heart failure and stage 1 through stage 4 chronic kidney disease, or unspecified chronic kidney disease: Secondary | ICD-10-CM

## 2024-04-06 DIAGNOSIS — I2510 Atherosclerotic heart disease of native coronary artery without angina pectoris: Secondary | ICD-10-CM

## 2024-04-06 DIAGNOSIS — I5030 Unspecified diastolic (congestive) heart failure: Secondary | ICD-10-CM

## 2024-04-06 DIAGNOSIS — I700 Atherosclerosis of aorta: Secondary | ICD-10-CM | POA: Diagnosis not present

## 2024-04-06 DIAGNOSIS — E1159 Type 2 diabetes mellitus with other circulatory complications: Secondary | ICD-10-CM

## 2024-04-06 DIAGNOSIS — D508 Other iron deficiency anemias: Secondary | ICD-10-CM

## 2024-04-06 DIAGNOSIS — M5416 Radiculopathy, lumbar region: Secondary | ICD-10-CM

## 2024-04-06 DIAGNOSIS — E538 Deficiency of other specified B group vitamins: Secondary | ICD-10-CM

## 2024-04-06 DIAGNOSIS — E1169 Type 2 diabetes mellitus with other specified complication: Secondary | ICD-10-CM

## 2024-04-06 DIAGNOSIS — D119 Benign neoplasm of major salivary gland, unspecified: Secondary | ICD-10-CM

## 2024-04-06 DIAGNOSIS — I152 Hypertension secondary to endocrine disorders: Secondary | ICD-10-CM

## 2024-04-06 DIAGNOSIS — E669 Obesity, unspecified: Secondary | ICD-10-CM

## 2024-04-06 LAB — COMPREHENSIVE METABOLIC PANEL WITH GFR
ALT: 7 U/L (ref 0–35)
AST: 13 U/L (ref 0–37)
Albumin: 4.3 g/dL (ref 3.5–5.2)
Alkaline Phosphatase: 49 U/L (ref 39–117)
BUN: 15 mg/dL (ref 6–23)
CO2: 27 meq/L (ref 19–32)
Calcium: 9.4 mg/dL (ref 8.4–10.5)
Chloride: 103 meq/L (ref 96–112)
Creatinine, Ser: 1.08 mg/dL (ref 0.40–1.20)
GFR: 51.27 mL/min — ABNORMAL LOW (ref >60.00)
Glucose, Bld: 104 mg/dL — ABNORMAL HIGH (ref 70–99)
Potassium: 4.7 meq/L (ref 3.5–5.1)
Sodium: 138 meq/L (ref 135–145)
Total Bilirubin: 0.9 mg/dL (ref 0.2–1.2)
Total Protein: 7.3 g/dL (ref 6.0–8.3)

## 2024-04-06 MED ORDER — METHOCARBAMOL 750 MG PO TABS: ORAL_TABLET | ORAL | 1 refills | Status: DC

## 2024-04-06 MED ORDER — CYANOCOBALAMIN 1000 MCG/ML IJ SOLN
1000.0000 ug | Freq: Once | INTRAMUSCULAR | Status: AC
  Administered 2024-04-06: 1000 ug via INTRAMUSCULAR

## 2024-04-06 MED ORDER — HYDROCODONE-ACETAMINOPHEN 10-325 MG PO TABS: 1.0000 | ORAL_TABLET | Freq: Four times a day (QID) | ORAL | 0 refills | Status: DC | PRN

## 2024-04-06 NOTE — Patient Instructions (Signed)
 PLEASE CONSIDER GETTING YOUR PNEUMONIA  vaccine ("prevnar 20)  and the TETANUS -PERTUSSIS (TDAP) VACCINES ASAP   Reduce your iron  to every other day

## 2024-04-06 NOTE — Assessment & Plan Note (Signed)
 She has completed 4 weekly doses and is continuing parenteral therapy monthly.  She notes improved energy level  since starting therapy

## 2024-04-06 NOTE — Assessment & Plan Note (Signed)
 S/p parotidectomy/Surgical removal  on May 7 without complications

## 2024-04-06 NOTE — Assessment & Plan Note (Signed)
 Has been managed  with diet per patient preference:   Metformin  recommended but deferred by patient. 0zempic discussed but declined. She has maintained an A1c < 6.0 and maintained an intended weight loss (with another 5 lbs lost since  November)t with low GI diet and exercise. Foot exam normal today.  T she has a contraindication ACE/ARB .  She is taking a high potency statin   Lab Results  Component Value Date   HGBA1C 5.4 01/07/2024    Lab Results  Component Value Date   LABMICR 20.3 07/04/2022   MICROALBUR 1.4 07/10/2023   MICROALBUR 1.0 07/13/2021

## 2024-04-06 NOTE — Assessment & Plan Note (Addendum)
 Cr is Stable.  Continue aggressive conrol of hypertension and avoidance of  NSAIDS.  ACE/ARB are C/I  Lab Results  Component Value Date   CREATININE 1.20 03/03/2024   . Lab Results  Component Value Date   NA 138 03/03/2024   K 4.2 03/03/2024   CL 101 03/03/2024   CO2 27 03/03/2024   Lab Results  Component Value Date   LABMICR 20.3 07/04/2022   MICROALBUR 1.4 07/10/2023   MICROALBUR 1.0 07/13/2021

## 2024-04-06 NOTE — Assessment & Plan Note (Signed)
 Secondary to DDD and scoliosis .  Her pain is managed with vicodin and methocarbamol.  NSAIDS  C/i due to CKD   .  She has not had any ER visits  And has not requested any early refills.  Her Refill history was confirmed via [place] Controlled Substance database by me today during her visit and there have been no prescriptions of controlled substances filled from any providers other than me. .Her pain is better controlled on the current regimen and she is staying as active as her pain which now.  Vicodin will be refilled for 3 months

## 2024-04-06 NOTE — Assessment & Plan Note (Signed)
She remains asymptomatic.   Continue aspirin 81 mg daily. Crestor 40 mg and carvedilol.     Lab Results  Component Value Date   CHOL 119 07/10/2023   HDL 44.00 07/10/2023   LDLCALC 31 07/04/2022   LDLDIRECT 48.0 07/10/2023   TRIG 245.0 (H) 07/10/2023   CHOLHDL 3 07/10/2023

## 2024-04-06 NOTE — Assessment & Plan Note (Signed)
 Her BP is at goal with 12.5 mg of amlodipine and 25 mg carvedilol

## 2024-04-06 NOTE — Assessment & Plan Note (Signed)
 Reviewed findings of prior CT scan today..  Patient is tolerating high potency statin therapy and LDL is < 70 on 40 mg rosuvastatin   Lab Results  Component Value Date   CHOL 119 07/10/2023   HDL 44.00 07/10/2023   LDLCALC 31 07/04/2022   LDLDIRECT 48.0 07/10/2023   TRIG 245.0 (H) 07/10/2023   CHOLHDL 3 07/10/2023

## 2024-04-06 NOTE — Progress Notes (Signed)
 Subjective:  Patient ID: Suzanne Cole, female    DOB: [birth year]  Age: 73 y.o. MRN: [id number]  CC: The primary encounter diagnosis was CKD (chronic kidney disease), symptom management only, stage 3 (moderate) (HCC). 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  Ear lobe is numb.    Fatigue: from surgery ,, home remodeling in process as well.  Sleeping well    B12 deficiency:  needs injection today.    HTN: forgot to take meds today  130/60  last reading at home   Obesity/diabetes:  She  feels generally well, is exercising several times per week and not checking blood sugars    Denies any recent hypoglyemic symptoms   Taking her statin  as directed. Following a carbohydrate modified diet 6 days per week. Denies numbness, burning and tingling of extremities. Appetite is good.       Outpatient Medications Prior to Visit  Medication Sig Dispense Refill   albuterol  (PROVENTIL ) (2.5 MG/3ML) 0.083% nebulizer solution Take 3 mLs (2.5 mg total) by nebulization every 6 (six) hours as needed for wheezing or shortness of breath. 150 mL 1   albuterol  (VENTOLIN  HFA) 108 (90 Base) MCG/ACT inhaler INHALE 2 PUFFS BY MOUTH EVERY 6 HOURS AS NEEDED FOR WHEEZING OR SHORTNESS OF BREATH 18 g 2   amLODipine  (NORVASC ) 10 MG tablet Take 1 tablet (10 mg total) by mouth every  morning. 90 tablet 3   amLODipine  (NORVASC ) 2.5 MG tablet Take 1 tablet (2.5 mg total) by mouth every evening. IN ADDITION TO 10 MG DOSE 90 tablet 1   carvedilol  (COREG ) 25 MG tablet TAKE 1 TABLET(25 MG) BY MOUTH TWICE DAILY 180 tablet 3   cetirizine (ZYRTEC) 10 MG tablet Take 10 mg by mouth daily.     Cholecalciferol (VITAMIN D3) 1000 units CHEW Chew 1 tablet by mouth daily.     fenofibrate  (TRICOR ) 145 MG tablet Take 1 tablet (145 mg total) by mouth daily. 90 tablet 3   Ferrous Sulfate (IRON ) 142 (45 Fe) MG TBCR Take 1 tablet by mouth daily. 30 tablet 1   fluticasone  (FLONASE ) 50 MCG/ACT nasal spray Place 2 sprays into both nostrils daily. 16 g 6   gabapentin  (NEURONTIN ) 300 MG capsule TAKE 1 CAPSULE(300 MG) BY MOUTH THREE TIMES DAILY 270 capsule 1   hydrochlorothiazide  (HYDRODIURIL ) 12.5 MG tablet Take 1 tablet (12.5 mg total) by mouth daily. 90 tablet 3   pantoprazole  (PROTONIX ) 40 MG tablet TAKE 1 TABLET(40 MG) BY MOUTH DAILY 90 tablet 3   rosuvastatin  (CRESTOR ) 40 MG tablet TAKE 1 TABLET(40 MG) BY MOUTH DAILY 90 tablet 3   HYDROcodone -acetaminophen  (NORCO) 10-325 MG tablet Take 1 tablet by mouth every 6 (six) hours as needed. 120 tablet 0   methocarbamol  (ROBAXIN ) 750 MG tablet TAKE 1 TABLET(750 MG) BY MOUTH FOUR TIMES DAILY 360 tablet 1   No facility-administered  medications prior to visit.    Review of Systems;  Patient denies headache, fevers, malaise, unintentional weight loss, skin rash, eye pain, sinus congestion and sinus pain, sore throat, dysphagia,  hemoptysis , cough, dyspnea, wheezing, chest pain, palpitations, orthopnea, edema, abdominal pain, nausea, melena, diarrhea, constipation, flank pain, dysuria, hematuria, urinary  Frequency, nocturia, numbness, tingling, seizures,  Focal weakness, Loss of consciousness,  Tremor, insomnia, depression, anxiety, and suicidal ideation.      Objective:  BP 130/60   Pulse 63   Ht 4\' 11"  (1.499 m)   Wt 133 lb (60.3 kg)   SpO2 96%    BMI 26.86 kg/m   BP Readings from Last 3 Encounters:  04/06/24 130/60  03/17/24 (!) 143/62  02/26/24 130/60    Wt Readings from Last 3 Encounters:  04/06/24 133 lb (60.3 kg)  03/09/24 134 lb (60.8 kg)  02/26/24 135 lb (61.2 kg)    Physical Exam Vitals reviewed.  Constitutional:      General: She is not in acute distress.    Appearance: Normal appearance. She is normal weight. She is not ill-appearing, toxic-appearing or diaphoretic.  HENT:     Head: Normocephalic.  Eyes:     General: No scleral icterus.       Right eye: No discharge.        Left eye: No discharge.     Conjunctiva/sclera: Conjunctivae normal.  Neck:      Comments: Healing surgical incision with minimal erythema and swelling  Cardiovascular:     Rate and Rhythm: Normal rate and regular rhythm.     Heart sounds: Normal heart sounds.  Pulmonary:     Effort: Pulmonary effort is normal. No respiratory distress.     Breath sounds: Normal breath sounds.  Musculoskeletal:        General: Normal range of motion.     Cervical back: Full passive range of motion without pain. Rigidity present.  Skin:    General: Skin is warm and dry.  Neurological:     General: No focal deficit present.     Mental Status: She is alert and oriented to person, place, and time. Mental status is at baseline.  Psychiatric:        Mood and Affect: Mood normal.        Behavior: Behavior normal.        Thought Content: Thought content normal.        Judgment: Judgment normal.     Lab Results  Component Value Date   HGBA1C 5.4 01/07/2024   HGBA1C 5.6 07/10/2023   HGBA1C 5.5 01/07/2023    Lab Results  Component Value Date   CREATININE 1.20 03/03/2024   CREATININE 0.97 10/08/2023   CREATININE 1.04 07/10/2023    Lab Results  Component Value Date   WBC 6.6 03/03/2024   HGB 13.6 03/03/2024   HCT 40.1 03/03/2024   PLT 286.0 03/03/2024   GLUCOSE 96 03/03/2024   CHOL 119 07/10/2023   TRIG 245.0 (H) 07/10/2023   HDL 44.00  07/10/2023   LDLDIRECT 48.0 07/10/2023   LDLCALC 31 07/04/2022   ALT 6 10/08/2023   AST 12 10/08/2023   NA 138 03/03/2024   K 4.2 03/03/2024   CL 101 03/03/2024   CREATININE 1.20 03/03/2024   BUN 22 03/03/2024   CO2 27 03/03/2024   TSH 1.060 06/19/2022   INR 1.0 06/20/2022   HGBA1C 5.4 01/07/2024   MICROALBUR 1.4 07/10/2023    No results found.  Assessment & Plan:  .  CKD (chronic kidney disease), symptom management only, stage 3 (moderate) (HCC) Assessment & Plan: Cr is Stable.  Continue aggressive conrol of hypertension and avoidance of  NSAIDS.  ACE/ARB are C/I  Lab Results  Component Value Date   CREATININE 1.20 03/03/2024   . Lab Results  Component Value Date   NA 138 03/03/2024   K 4.2 03/03/2024   CL 101 03/03/2024   CO2 27 03/03/2024   Lab Results  Component Value Date   LABMICR 20.3 07/04/2022   MICROALBUR 1.4 07/10/2023   MICROALBUR 1.0 07/13/2021       Orders: -     Comprehensive metabolic panel with GFR  B12 deficiency Assessment & Plan: She has completed 4 weekly doses and is continuing parenteral therapy monthly.  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, NYHA class 1 and CKD stage 3 (HCC) Assessment & Plan: Her BP is at goal with 12.5 mg of amlodipine  and 25 mg carvedilol     Other iron  deficiency anemia Assessment & Plan: Resolved with oral iron  supplementation ; advised to  reduce iron  supplementation to every other day   Lab Results  Component Value Date   WBC 6.6 03/03/2024   HGB 13.6 03/03/2024   HCT 40.1 03/03/2024   MCV 92.0 03/03/2024   PLT 286.0 03/03/2024   Lab Results  Component Value Date   IRON  102 03/03/2024   TIBC 324 03/03/2024   FERRITIN 98 03/03/2024      Lumbar radiculitis Assessment & Plan: Secondary to DDD and scoliosis .  Her pain is managed with vicodin and methocarbamol .  NSAIDS  C/i due  to CKD   .  She has not had any ER visits  And has not requested any early refills.  Her Refill history was confirmed via [place] Controlled Substance database by me today during her visit and there have been no prescriptions of controlled substances filled from any providers other than me. .Her pain is better controlled on the current regimen and she is staying as active as her pain which now.  Vicodin will be refilled for 3 months    Thoracic aortic atherosclerosis [facility]) Assessment & Plan: Reviewed findings of prior CT scan today..  Patient is tolerating high potency statin therapy and LDL is < 70 on 40 mg rosuvastatin   Lab Results  Component Value Date   CHOL 119 07/10/2023   HDL 44.00 07/10/2023   LDLCALC 31 07/04/2022   LDLDIRECT 48.0 07/10/2023   TRIG 245.0 (H) 07/10/2023   CHOLHDL 3 07/10/2023        Coronary artery disease involving native coronary artery of native heart without angina pectoris Assessment & Plan: She remains asymptomatic.   Continue aspirin 81 mg daily. Crestor  40 mg and carvedilol .     Lab Results  Component Value Date   CHOL 119 07/10/2023   HDL 44.00 07/10/2023   LDLCALC 31 07/04/2022   LDLDIRECT 48.0 07/10/2023   TRIG 245.0 (H) 07/10/2023   CHOLHDL 3 07/10/2023      Obesity, diabetes, and hypertension syndrome (HCC) Assessment & Plan: Has been managed  with diet per patient preference:   Metformin  recommended but deferred by patient. 0zempic discussed but declined. She has maintained an A1c < 6.0 and maintained an intended weight loss (with another 5 lbs lost since  November)t with low GI diet and exercise. Foot exam normal today.  T she  has a contraindication ACE/ARB .  She is taking a high potency statin   Lab Results  Component Value Date   HGBA1C 5.4 01/07/2024    Lab Results  Component Value Date   LABMICR 20.3 07/04/2022   MICROALBUR 1.4 07/10/2023   MICROALBUR 1.0 07/13/2021        [place] tumor Assessment & Plan: S/p  parotidectomy/Surgical removal  on May 7 without complications   Other orders -     Methocarbamol ; TAKE 1 TABLET(750 MG) BY MOUTH FOUR TIMES DAILY  Dispense: 360 tablet; Refill: 1 -     HYDROcodone -Acetaminophen ; Take 1 tablet by mouth every 6 (six) hours as needed.  Dispense: 120 tablet; Refill: 0 -     HYDROcodone -Acetaminophen ; Take 1 tablet by mouth every 6 (six) hours as needed.  Dispense: 120 tablet; Refill: 0 -     HYDROcodone -Acetaminophen ; Take 1 tablet by mouth every 6 (six) hours as needed.  Dispense: 120 tablet; Refill: 0     I spent 34 minutes on the day of this face to face encounter reviewing patient's  most recent visit with  ENT, cardiology,  recent ENT surgical and non surgical procedures, recent  labs and imaging studies, counseling on weight management,  reviewing the assessment and plan with patient, and post visit ordering and reviewing of  diagnostics and therapeutics with patient  .   Follow-up: Return in about 3 months (around 07/07/2024) for chronic pain management.   Thersia Flax, MD

## 2024-04-06 NOTE — Assessment & Plan Note (Signed)
 Resolved with oral iron  supplementation ; advised to  reduce iron  supplementation to every other day   Lab Results  Component Value Date   WBC 6.6 03/03/2024   HGB 13.6 03/03/2024   HCT 40.1 03/03/2024   MCV 92.0 03/03/2024   PLT 286.0 03/03/2024   Lab Results  Component Value Date   IRON  102 03/03/2024   TIBC 324 03/03/2024   FERRITIN 98 03/03/2024

## 2024-04-07 ENCOUNTER — Ambulatory Visit: Payer: Self-pay | Admitting: Internal Medicine

## 2024-05-05 ENCOUNTER — Ambulatory Visit: Admitting: Medical

## 2024-05-05 ENCOUNTER — Ambulatory Visit (INDEPENDENT_AMBULATORY_CARE_PROVIDER_SITE_OTHER)

## 2024-05-05 DIAGNOSIS — E538 Deficiency of other specified B group vitamins: Secondary | ICD-10-CM

## 2024-05-05 MED ORDER — CYANOCOBALAMIN 1000 MCG/ML IJ SOLN
1000.0000 ug | Freq: Once | INTRAMUSCULAR | Status: AC
Start: 2024-05-05 — End: 2024-05-05
  Administered 2024-05-05: 1000 ug via INTRAMUSCULAR

## 2024-05-05 NOTE — Patient Instructions (Signed)
 Patient was administered a B12 injection into her left deltoid. Patient tolerated the B12 injection well.

## 2024-05-10 NOTE — Progress Notes (Signed)
 Patient was administered a B12 injection into her left deltoid on 05/05/24. Patient tolerated the injection well.

## 2024-06-05 ENCOUNTER — Other Ambulatory Visit: Payer: Self-pay | Admitting: Internal Medicine

## 2024-06-07 ENCOUNTER — Ambulatory Visit (INDEPENDENT_AMBULATORY_CARE_PROVIDER_SITE_OTHER)

## 2024-06-07 DIAGNOSIS — E538 Deficiency of other specified B group vitamins: Secondary | ICD-10-CM

## 2024-06-07 MED ORDER — CYANOCOBALAMIN 1000 MCG/ML IJ SOLN
1000.0000 ug | Freq: Once | INTRAMUSCULAR | Status: AC
  Administered 2024-06-07: 1000 ug via INTRAMUSCULAR

## 2024-06-07 NOTE — Progress Notes (Signed)
 Pt presented for their vitamin B12 injection. Pt was identified through two identifiers. Pt tolerated shot well in their right deltoid.

## 2024-06-14 ENCOUNTER — Encounter: Payer: Self-pay | Admitting: Internal Medicine

## 2024-06-14 DIAGNOSIS — Z1231 Encounter for screening mammogram for malignant neoplasm of breast: Secondary | ICD-10-CM | POA: Diagnosis not present

## 2024-06-14 LAB — HM MAMMOGRAPHY

## 2024-07-05 ENCOUNTER — Other Ambulatory Visit: Payer: Self-pay | Admitting: Internal Medicine

## 2024-07-05 DIAGNOSIS — R399 Unspecified symptoms and signs involving the genitourinary system: Secondary | ICD-10-CM | POA: Diagnosis not present

## 2024-07-05 DIAGNOSIS — R350 Frequency of micturition: Secondary | ICD-10-CM | POA: Diagnosis not present

## 2024-07-07 ENCOUNTER — Encounter: Payer: Self-pay | Admitting: Internal Medicine

## 2024-07-07 ENCOUNTER — Telehealth: Payer: Self-pay

## 2024-07-07 ENCOUNTER — Ambulatory Visit: Admitting: Internal Medicine

## 2024-07-07 VITALS — BP 130/60 | HR 64 | Temp 97.6°F | Ht 59.0 in | Wt 124.6 lb

## 2024-07-07 DIAGNOSIS — I130 Hypertensive heart and chronic kidney disease with heart failure and stage 1 through stage 4 chronic kidney disease, or unspecified chronic kidney disease: Secondary | ICD-10-CM | POA: Diagnosis not present

## 2024-07-07 DIAGNOSIS — I152 Hypertension secondary to endocrine disorders: Secondary | ICD-10-CM

## 2024-07-07 DIAGNOSIS — D119 Benign neoplasm of major salivary gland, unspecified: Secondary | ICD-10-CM | POA: Diagnosis not present

## 2024-07-07 DIAGNOSIS — M5416 Radiculopathy, lumbar region: Secondary | ICD-10-CM

## 2024-07-07 DIAGNOSIS — E669 Obesity, unspecified: Secondary | ICD-10-CM

## 2024-07-07 DIAGNOSIS — E538 Deficiency of other specified B group vitamins: Secondary | ICD-10-CM

## 2024-07-07 DIAGNOSIS — N183 Chronic kidney disease, stage 3 unspecified: Secondary | ICD-10-CM

## 2024-07-07 DIAGNOSIS — E1169 Type 2 diabetes mellitus with other specified complication: Secondary | ICD-10-CM | POA: Diagnosis not present

## 2024-07-07 DIAGNOSIS — I5030 Unspecified diastolic (congestive) heart failure: Secondary | ICD-10-CM | POA: Diagnosis not present

## 2024-07-07 DIAGNOSIS — E785 Hyperlipidemia, unspecified: Secondary | ICD-10-CM

## 2024-07-07 DIAGNOSIS — E1159 Type 2 diabetes mellitus with other circulatory complications: Secondary | ICD-10-CM | POA: Diagnosis not present

## 2024-07-07 LAB — COMPREHENSIVE METABOLIC PANEL WITH GFR
ALT: 7 U/L (ref 0–35)
AST: 16 U/L (ref 0–37)
Albumin: 4.5 g/dL (ref 3.5–5.2)
Alkaline Phosphatase: 47 U/L (ref 39–117)
BUN: 19 mg/dL (ref 6–23)
CO2: 30 meq/L (ref 19–32)
Calcium: 9.1 mg/dL (ref 8.4–10.5)
Chloride: 98 meq/L (ref 96–112)
Creatinine, Ser: 1.24 mg/dL — ABNORMAL HIGH (ref 0.40–1.20)
GFR: 43.36 mL/min — ABNORMAL LOW (ref >60.00)
Glucose, Bld: 90 mg/dL (ref 70–99)
Potassium: 4.8 meq/L (ref 3.5–5.1)
Sodium: 139 meq/L (ref 135–145)
Total Bilirubin: 1.1 mg/dL (ref 0.2–1.2)
Total Protein: 7.6 g/dL (ref 6.0–8.3)

## 2024-07-07 LAB — LIPID PANEL
Cholesterol: 143 mg/dL (ref 0–200)
HDL: 44.1 mg/dL (ref >39.00)
LDL Cholesterol: 55 mg/dL (ref 0–99)
NonHDL: 98.87
Total CHOL/HDL Ratio: 3
Triglycerides: 219 mg/dL — ABNORMAL HIGH (ref 0.0–149.0)
VLDL: 43.8 mg/dL — ABNORMAL HIGH (ref 0.0–40.0)

## 2024-07-07 LAB — MICROALBUMIN / CREATININE URINE RATIO
Creatinine,U: 149.3 mg/dL
Microalb Creat Ratio: 9.1 mg/g (ref 0.0–30.0)
Microalb, Ur: 1.4 mg/dL (ref 0.0–1.9)

## 2024-07-07 LAB — HEMOGLOBIN A1C: Hgb A1c MFr Bld: 5.8 % (ref 4.6–6.5)

## 2024-07-07 MED ORDER — CYANOCOBALAMIN 1000 MCG/ML IJ SOLN
1000.0000 ug | Freq: Once | INTRAMUSCULAR | Status: AC
  Administered 2024-07-07: 1000 ug via INTRAMUSCULAR

## 2024-07-07 MED ORDER — HYDROCODONE-ACETAMINOPHEN 10-325 MG PO TABS: 1.0000 | ORAL_TABLET | Freq: Four times a day (QID) | ORAL | 0 refills | Status: DC | PRN

## 2024-07-07 NOTE — Telephone Encounter (Signed)
 If the Patient has a office visit with a provider and needs a b12 most of our providers just get us  to give them the b12 injection then and yes, Dr. Marylynn is in the group that does that.

## 2024-07-07 NOTE — Telephone Encounter (Signed)
 Patient states at check-out that she has monthly B12 injections.  Patient states Dr. Verneita Kettering would like to see her again in three months.  I scheduled patient's B12 injections for 08/09/2024 and 09/08/2024.  Patient states she needs to have her appointment earlier rather than later with Dr. Tullo because she needs to have her medication refilled.  Since this is a holiday week, I scheduled patient's  appointment with Dr. Kettering on 10/06/2024.  Patient asked if she can have her B12 injection at her appointment with Dr. Kettering.  I let her know that we don't usually schedule B12 injections several days early, but I will send a message to Dr. Kettering to see what she says.

## 2024-07-07 NOTE — Progress Notes (Unsigned)
 Subjective:  Patient ID: Suzanne Cole, female    DOB: 29-Jun-1951  Age: 73 y.o. MRN: [id number]  CC: There were no encounter diagnoses.   HPI Suzanne Cole presents for No chief complaint on file.  CHRONIC BACK AND HIP PAIN:  MANAGED WITH HYDROCODONE  10/324 LAST REFILL JULY 31   using  copper  fit back brace for outdoor work   Had  Td updated.    Neck still sore and numbn has ENT appt tomorrow   Type 2 dm: has lost 10 lbs by  giving up soda   .feels better    Outpatient Medications Prior to Visit  Medication Sig Dispense Refill   albuterol  (PROVENTIL ) (2.5 MG/3ML) 0.083% nebulizer solution Take 3 mLs (2.5 mg total) by nebulization every 6 (six) hours as needed for wheezing or shortness of breath. 150 mL 1   albuterol  (VENTOLIN  HFA) 108 (90 Base) MCG/ACT inhaler INHALE 2 PUFFS BY MOUTH EVERY 6 HOURS AS NEEDED FOR WHEEZING OR SHORTNESS OF BREATH 18 g 2   amLODipine  (NORVASC ) 10 MG tablet Take 1 tablet (10 mg total) by mouth every morning. 90 tablet 3   amLODipine  (NORVASC ) 2.5 MG tablet Take 1 tablet (2.5 mg total) by mouth every evening. IN ADDITION TO 10 MG DOSE 90 tablet 1   carvedilol  (COREG ) 25 MG tablet TAKE 1 TABLET(25 MG) BY MOUTH TWICE DAILY 180 tablet 3   cetirizine (ZYRTEC) 10 MG tablet Take 10 mg by mouth daily.     Cholecalciferol (VITAMIN D3) 1000 units CHEW Chew 1 tablet by mouth daily.     fenofibrate  (TRICOR ) 145 MG tablet Take 1 tablet (145 mg total) by mouth daily. 90 tablet 3   Ferrous Sulfate (IRON ) 142 (45 Fe) MG TBCR Take 1 tablet by mouth daily. 30 tablet 1   fluticasone  (FLONASE ) 50 MCG/ACT nasal spray Place 2 sprays into both nostrils daily. 16 g 6   gabapentin  (NEURONTIN ) 300 MG capsule TAKE 1 CAPSULE(300 MG) BY MOUTH THREE TIMES DAILY 270 capsule 1   hydrochlorothiazide  (HYDRODIURIL ) 12.5 MG tablet Take 1 tablet (12.5 mg total) by mouth daily. 90 tablet 3   HYDROcodone -acetaminophen  (NORCO) 10-325 MG tablet Take 1 tablet by mouth every 6 (six) hours as  needed. 120 tablet 0   HYDROcodone -acetaminophen  (NORCO) 10-325 MG tablet Take 1 tablet by mouth every 6 (six) hours as needed. 120 tablet 0   HYDROcodone -acetaminophen  (NORCO) 10-325 MG tablet Take 1 tablet by mouth every 6 (six) hours as needed. 120 tablet 0   methocarbamol  (ROBAXIN ) 750 MG tablet TAKE 1 TABLET(750 MG) BY MOUTH FOUR TIMES DAILY 360 tablet 1   pantoprazole  (PROTONIX ) 40 MG tablet TAKE 1 TABLET(40 MG) BY MOUTH DAILY 90 tablet 3   rosuvastatin  (CRESTOR ) 40 MG tablet TAKE 1 TABLET(40 MG) BY MOUTH DAILY 90 tablet 3   No facility-administered medications prior to visit.    Review of Systems;  Patient denies headache, fevers, malaise, unintentional weight loss, skin rash, eye pain, sinus congestion and sinus pain, sore throat, dysphagia,  hemoptysis , cough, dyspnea, wheezing, chest pain, palpitations, orthopnea, edema, abdominal pain, nausea, melena, diarrhea, constipation, flank pain, dysuria, hematuria, urinary  Frequency, nocturia, numbness, tingling, seizures,  Focal weakness, Loss of consciousness,  Tremor, insomnia, depression, anxiety, and suicidal ideation.      Objective:  There were no vitals taken for this visit.  BP Readings from Last 3 Encounters:  04/06/24 130/60  03/17/24 (!) 143/62  02/26/24 130/60    Wt Readings from Last 3 Encounters:  04/06/24 133 lb (60.3 kg)  03/09/24 134 lb (60.8 kg)  02/26/24 135 lb (61.2 kg)    Physical Exam  Lab Results  Component Value Date   HGBA1C 5.4 01/07/2024   HGBA1C 5.6 07/10/2023   HGBA1C 5.5 01/07/2023    Lab Results  Component Value Date   CREATININE 1.08 04/06/2024   CREATININE 1.20 03/03/2024   CREATININE 0.97 10/08/2023    Lab Results  Component Value Date   WBC 6.6 03/03/2024   HGB 13.6 03/03/2024   HCT 40.1 03/03/2024   PLT 286.0 03/03/2024   GLUCOSE 104 (H) 04/06/2024   CHOL 119 07/10/2023   TRIG 245.0 (H) 07/10/2023   HDL 44.00 07/10/2023   LDLDIRECT 48.0 07/10/2023   LDLCALC 31  07/04/2022   ALT 7 04/06/2024   AST 13 04/06/2024   NA 138 04/06/2024   K 4.7 04/06/2024   CL 103 04/06/2024   CREATININE 1.08 04/06/2024   BUN 15 04/06/2024   CO2 27 04/06/2024   TSH 1.060 06/19/2022   INR 1.0 06/20/2022   HGBA1C 5.4 01/07/2024    No results found.  Assessment & Plan:  .There are no diagnoses linked to this encounter.   I spent 34 minutes on the day of this face to face encounter reviewing patient's  most recent visit with cardiology,  nephrology,  and neurology,  prior relevant surgical and non surgical procedures, recent  labs and imaging studies, counseling on weight management,  reviewing the assessment and plan with patient, and post visit ordering and reviewing of  diagnostics and therapeutics with patient  .   Follow-up: No follow-ups on file.   Verneita LITTIE Kettering, MD

## 2024-07-07 NOTE — Patient Instructions (Signed)
 Congratulations on giving up  sugar!  You've lost 11 lbs since last visit   See you in  3 months

## 2024-07-08 DIAGNOSIS — D110 Benign neoplasm of parotid gland: Secondary | ICD-10-CM | POA: Diagnosis not present

## 2024-07-08 NOTE — Assessment & Plan Note (Signed)
 She is at goal LDL of < 70 on crestor  high dose for known CAD and PAD.LFTs are normal.   Lab Results  Component Value Date   CHOL 143 07/07/2024   HDL 44.10 07/07/2024   LDLCALC 55 07/07/2024   LDLDIRECT 48.0 07/10/2023   TRIG 219.0 (H) 07/07/2024   CHOLHDL 3 07/07/2024   Lab Results  Component Value Date   ALT 7 07/07/2024   AST 16 07/07/2024   ALKPHOS 47 07/07/2024   BILITOT 1.1 07/07/2024

## 2024-07-08 NOTE — Assessment & Plan Note (Signed)
 Her BP is at goal with 12.5 mg of amlodipine  and 25 mg carvedilol  .  Cr is stable;  She has a history of  hyperkalemia with ACE I  trial   Lab Results  Component Value Date   CREATININE 1.24 (H) 07/07/2024   Lab Results  Component Value Date   LABMICR 20.3 07/04/2022   MICROALBUR 1.4 07/07/2024

## 2024-07-08 NOTE — Assessment & Plan Note (Addendum)
 Secondary to DDD and scoliosis .  Her pain is managed with hydrocodone   and methocarbamol .  NSAIDS  C/i due to CKD   .  She has not had any ER visits  And has not requested any early refills.  Her Refill history was confirmed via [place] Controlled Substance database by me today during her visit and there have been no prescriptions of controlled substances filled from any providers other than me. .Her pain is better controlled on the current regimen and she is staying as active as her pain which now.  Hydrocodone   will be refilled for 3 months

## 2024-07-08 NOTE — Assessment & Plan Note (Signed)
 S/p parotidectomy/Surgical removal  on May 7 without complications.  Continue ENT follow up

## 2024-07-08 NOTE — Assessment & Plan Note (Signed)
 Cr is Stable with  aggressive conrol of hypertension and avoidance of  NSAIDS.  ACE/ARB are C/I  Lab Results  Component Value Date   CREATININE 1.24 (H) 07/07/2024   . Lab Results  Component Value Date   NA 139 07/07/2024   K 4.8 Hemolysis seen.SABRA 07/07/2024   CL 98 07/07/2024   CO2 30 07/07/2024   Lab Results  Component Value Date   LABMICR 20.3 07/04/2022   MICROALBUR 1.4 07/07/2024     Cr is Stable.  Continue aggressive conrol of hypertension and avoidance of  NSAIDS.  ACE/ARB are C/I  Lab Results  Component Value Date   CREATININE 1.24 (H) 07/07/2024   . Lab Results  Component Value Date   NA 139 07/07/2024   K 4.8 Hemolysis seen.SABRA 07/07/2024   CL 98 07/07/2024   CO2 30 07/07/2024   Lab Results  Component Value Date   LABMICR 20.3 07/04/2022   MICROALBUR 1.4 07/07/2024

## 2024-07-08 NOTE — Assessment & Plan Note (Signed)
 She remains well controlled without medications by following a careful  diet per patient preference:   Metformin  recommended but deferred by patient. 0zempic discussed but declined. She is losing weight voluntarily and  has maintained an A1c < 6.0 She has a contraindication ACE/ARB .  She is taking a high potency statin   Lab Results  Component Value Date   HGBA1C 5.8 07/07/2024    Lab Results  Component Value Date   LABMICR 20.3 07/04/2022   MICROALBUR 1.4 07/07/2024

## 2024-07-09 ENCOUNTER — Ambulatory Visit: Payer: Self-pay | Admitting: Internal Medicine

## 2024-07-09 DIAGNOSIS — N183 Chronic kidney disease, stage 3 unspecified: Secondary | ICD-10-CM

## 2024-08-02 NOTE — Progress Notes (Signed)
 Suzanne Cole                                          MRN: [id number]   08/02/2024   The [facility] Quality Team Specialist reviewed this patient medical record for the purposes of chart review for care gap closure. The following were reviewed: abstraction for care gap closure-glycemic status assessment.    [facility] Quality Team

## 2024-08-09 ENCOUNTER — Ambulatory Visit (INDEPENDENT_AMBULATORY_CARE_PROVIDER_SITE_OTHER)

## 2024-08-09 DIAGNOSIS — E538 Deficiency of other specified B group vitamins: Secondary | ICD-10-CM | POA: Diagnosis not present

## 2024-08-09 MED ORDER — CYANOCOBALAMIN 1000 MCG/ML IJ SOLN
1000.0000 ug | Freq: Once | INTRAMUSCULAR | Status: AC
  Administered 2024-08-09: 1000 ug via INTRAMUSCULAR

## 2024-08-09 NOTE — Progress Notes (Signed)
 Pt presented for their vitamin B12 injection. Pt was identified through two identifiers. Pt tolerated shot well in their left deltoid.

## 2024-09-08 ENCOUNTER — Ambulatory Visit

## 2024-09-08 DIAGNOSIS — Z23 Encounter for immunization: Secondary | ICD-10-CM

## 2024-09-08 DIAGNOSIS — E538 Deficiency of other specified B group vitamins: Secondary | ICD-10-CM | POA: Diagnosis not present

## 2024-09-08 MED ORDER — CYANOCOBALAMIN 1000 MCG/ML IJ SOLN
1000.0000 ug | Freq: Once | INTRAMUSCULAR | Status: AC
  Administered 2024-09-09: 1000 ug via INTRAMUSCULAR

## 2024-09-08 NOTE — Progress Notes (Signed)
 Patient was administered a B12 injection into her right deltoid. Patient tolerated the B12 injection well.  Patient was also administered a high dose flu vaccine into her left deltoid. Patient tolerated the high dose flu vaccine well.

## 2024-09-09 DIAGNOSIS — Z23 Encounter for immunization: Secondary | ICD-10-CM | POA: Diagnosis not present

## 2024-09-09 DIAGNOSIS — E538 Deficiency of other specified B group vitamins: Secondary | ICD-10-CM | POA: Diagnosis not present

## 2024-09-15 ENCOUNTER — Ambulatory Visit: Payer: Medicare HMO

## 2024-09-21 ENCOUNTER — Other Ambulatory Visit: Payer: Self-pay

## 2024-09-21 MED ORDER — GABAPENTIN 300 MG PO CAPS: ORAL_CAPSULE | ORAL | 1 refills | Status: AC

## 2024-10-06 ENCOUNTER — Ambulatory Visit: Admitting: Internal Medicine

## 2024-10-06 ENCOUNTER — Encounter: Payer: Self-pay | Admitting: Internal Medicine

## 2024-10-06 VITALS — BP 134/80 | HR 67 | Temp 98.3°F | Ht 59.0 in | Wt 122.2 lb

## 2024-10-06 DIAGNOSIS — E785 Hyperlipidemia, unspecified: Secondary | ICD-10-CM

## 2024-10-06 DIAGNOSIS — R944 Abnormal results of kidney function studies: Secondary | ICD-10-CM | POA: Diagnosis not present

## 2024-10-06 DIAGNOSIS — I2510 Atherosclerotic heart disease of native coronary artery without angina pectoris: Secondary | ICD-10-CM | POA: Diagnosis not present

## 2024-10-06 DIAGNOSIS — I10 Essential (primary) hypertension: Secondary | ICD-10-CM

## 2024-10-06 DIAGNOSIS — E538 Deficiency of other specified B group vitamins: Secondary | ICD-10-CM | POA: Diagnosis not present

## 2024-10-06 DIAGNOSIS — E669 Obesity, unspecified: Secondary | ICD-10-CM

## 2024-10-06 DIAGNOSIS — M5416 Radiculopathy, lumbar region: Secondary | ICD-10-CM

## 2024-10-06 DIAGNOSIS — E119 Type 2 diabetes mellitus without complications: Secondary | ICD-10-CM

## 2024-10-06 LAB — BASIC METABOLIC PANEL WITH GFR
BUN: 19 mg/dL (ref 6–23)
CO2: 28 meq/L (ref 19–32)
Calcium: 9.7 mg/dL (ref 8.4–10.5)
Chloride: 105 meq/L (ref 96–112)
Creatinine, Ser: 1.03 mg/dL (ref 0.40–1.20)
GFR: 54.08 mL/min — ABNORMAL LOW (ref >60.00)
Glucose, Bld: 95 mg/dL (ref 70–99)
Potassium: 4.3 meq/L (ref 3.5–5.1)
Sodium: 139 meq/L (ref 135–145)

## 2024-10-06 MED ORDER — HYDROCODONE-ACETAMINOPHEN 10-325 MG PO TABS: 1.0000 | ORAL_TABLET | Freq: Four times a day (QID) | ORAL | 0 refills | Status: DC | PRN

## 2024-10-06 MED ORDER — HYDROCODONE-ACETAMINOPHEN 10-325 MG PO TABS: 1.0000 | ORAL_TABLET | Freq: Four times a day (QID) | ORAL | 0 refills | Status: AC | PRN

## 2024-10-06 MED ORDER — CYANOCOBALAMIN 1000 MCG/ML IJ SOLN
1000.0000 ug | Freq: Once | INTRAMUSCULAR | Status: AC
  Administered 2024-10-06: 1000 ug via INTRAMUSCULAR

## 2024-10-06 NOTE — Progress Notes (Signed)
 Subjective:  Patient ID: Suzanne Cole, female    DOB: [birth year]  Age: 73 y.o. MRN: [id number]  CC: The primary encounter diagnosis was B12 deficiency. Diagnoses of Decreased GFR, Obesity, diabetes, and hypertension syndrome (HCC), Hyperlipidemia LDL goal <55, Coronary artery disease involving native coronary artery of native heart without angina pectoris, and Lumbar radiculitis were also pertinent to this visit.   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  She has been  trying to reduce her meds.  Leg has been hurting less lately.    2) Overwieght :  Weight is stable.  Following a low G I diet and staying active   3) neck surgery: swelling has resolved,  still sore behind the ears 6 months ago . Alternating between gabapentin  300 mg tid and bid.    4) Grief/stress:  her younger  brother Dick Cooper is  critically  ill and hospitalized in [place] with a terminal prognosis secondary to gangrenous  lower extremity and brain abscess.  Her husband has been acting strangely since his most recent COVID/FLu  vaccinations and c/o persistent headache  ,  and her beloved 5 yr old cat died last week from injuries sustained during an attack a month ago by a   neighborhood pit bull     Outpatient Medications Prior to Visit  Medication Sig Dispense Refill   albuterol  (PROVENTIL ) (2.5 MG/3ML) 0.083% nebulizer solution Take 3 mLs (2.5 mg total) by nebulization every 6 (six) hours as needed for wheezing or shortness of breath. 150 mL 1   albuterol  (VENTOLIN  HFA) 108 (90 Base) MCG/ACT inhaler INHALE 2 PUFFS BY MOUTH EVERY 6 HOURS AS NEEDED FOR WHEEZING OR SHORTNESS OF BREATH 18 g 2   amLODipine  (NORVASC ) 10 MG tablet Take 1 tablet (10 mg total) by mouth every morning. 90 tablet 3   amLODipine  (NORVASC ) 2.5 MG tablet Take 1 tablet (2.5 mg total)  by mouth every evening. IN ADDITION TO 10 MG DOSE 90 tablet 1   BOOSTRIX 5-2.5-18.5 LF-MCG/0.5 injection      carvedilol  (COREG ) 25 MG tablet TAKE 1 TABLET(25 MG) BY MOUTH TWICE DAILY 180 tablet 3   cetirizine (ZYRTEC) 10 MG tablet Take 10 mg by mouth daily.     Cholecalciferol (VITAMIN D3) 1000 units CHEW Chew 1 tablet by mouth daily.     fenofibrate  (TRICOR ) 145 MG tablet Take 1 tablet (145 mg total) by mouth daily. 90 tablet 3   Ferrous Sulfate (IRON ) 142 (45 Fe) MG TBCR Take 1 tablet by mouth daily. 30 tablet 1   fluticasone  (FLONASE ) 50 MCG/ACT nasal spray Place 2 sprays into both nostrils daily. 16 g 6   gabapentin  (NEURONTIN ) 300 MG capsule TAKE 1 CAPSULE(300 MG) BY MOUTH THREE TIMES DAILY 270 capsule 1   hydrochlorothiazide  (HYDRODIURIL ) 12.5 MG tablet Take 1 tablet (12.5 mg total) by mouth daily. 90 tablet 3   methocarbamol  (ROBAXIN ) 750 MG tablet TAKE 1 TABLET(750 MG) BY MOUTH FOUR TIMES DAILY 360 tablet 1   nitrofurantoin, macrocrystal-monohydrate, (MACROBID) 100 MG capsule Take 100 mg by mouth every 12 (twelve) hours.     pantoprazole  (PROTONIX ) 40 MG tablet TAKE 1 TABLET(40 MG) BY MOUTH DAILY 90 tablet 3   rosuvastatin  (CRESTOR ) 40 MG tablet TAKE 1 TABLET(40 MG) BY MOUTH DAILY 90 tablet 3   HYDROcodone -acetaminophen  (NORCO) 10-325  MG tablet Take 1 tablet by mouth every 6 (six) hours as needed. 120 tablet 0   HYDROcodone -acetaminophen  (NORCO) 10-325 MG tablet Take 1 tablet by mouth every 6 (six) hours as needed. 120 tablet 0   HYDROcodone -acetaminophen  (NORCO) 10-325 MG tablet Take 1 tablet by mouth every 6 (six) hours as needed. 120 tablet 0   No facility-administered medications prior to visit.    Review of Systems;  Patient denies headache, fevers, malaise, unintentional weight loss, skin rash, eye pain, sinus congestion and sinus pain, sore throat, dysphagia,  hemoptysis , cough, dyspnea, wheezing, chest pain, palpitations, orthopnea, edema, abdominal pain, nausea, melena,  diarrhea, constipation, flank pain, dysuria, hematuria, urinary  Frequency, nocturia, numbness, tingling, seizures,  Focal weakness, Loss of consciousness,  Tremor, insomnia, depression, anxiety, and suicidal ideation.      Objective:  BP 134/80   Pulse 67   Temp 98.3 F (36.8 C)   Ht 4' 11 (1.499 m)   Wt 122 lb 3.2 oz (55.4 kg)   SpO2 98%   BMI 24.68 kg/m   BP Readings from Last 3 Encounters:  10/06/24 134/80  07/07/24 130/60  04/06/24 130/60    Wt Readings from Last 3 Encounters:  10/06/24 122 lb 3.2 oz (55.4 kg)  07/07/24 124 lb 9.6 oz (56.5 kg)  04/06/24 133 lb (60.3 kg)    Physical Exam Vitals reviewed.  Constitutional:      General: She is not in acute distress.    Appearance: Normal appearance. She is normal weight. She is not ill-appearing, toxic-appearing or diaphoretic.  HENT:     Head: Normocephalic.  Eyes:     General: No scleral icterus.       Right eye: No discharge.        Left eye: No discharge.     Conjunctiva/sclera: Conjunctivae normal.  Cardiovascular:     Rate and Rhythm: Normal rate and regular rhythm.     Heart sounds: Normal heart sounds.  Pulmonary:     Effort: Pulmonary effort is normal. No respiratory distress.     Breath sounds: Normal breath sounds.  Musculoskeletal:        General: Normal range of motion.  Skin:    General: Skin is warm and dry.  Neurological:     General: No focal deficit present.     Mental Status: She is alert and oriented to person, place, and time. Mental status is at baseline.  Psychiatric:        Mood and Affect: Mood normal.        Behavior: Behavior normal.        Thought Content: Thought content normal.        Judgment: Judgment normal.     Lab Results  Component Value Date   HGBA1C 5.8 07/07/2024   HGBA1C 5.4 01/07/2024   HGBA1C 5.6 07/10/2023    Lab Results  Component Value Date   CREATININE 1.03 10/06/2024   CREATININE 1.24 (H) 07/07/2024   CREATININE 1.08 04/06/2024    Lab Results   Component Value Date   WBC 6.6 03/03/2024   HGB 13.6 03/03/2024   HCT 40.1 03/03/2024   PLT 286.0 03/03/2024   GLUCOSE 95 10/06/2024   CHOL 143 07/07/2024   TRIG 219.0 (H) 07/07/2024   HDL 44.10 07/07/2024   LDLDIRECT 48.0 07/10/2023   LDLCALC 55 07/07/2024   ALT 7 07/07/2024   AST 16 07/07/2024   NA 139 10/06/2024   K 4.3 10/06/2024   CL 105 10/06/2024  CREATININE 1.03 10/06/2024   BUN 19 10/06/2024   CO2 28 10/06/2024   TSH 1.060 06/19/2022   INR 1.0 06/20/2022   HGBA1C 5.8 07/07/2024   MICROALBUR 1.4 07/07/2024    No results found.  Assessment & Plan:  .B12 deficiency -     Cyanocobalamin   Decreased GFR -     Basic metabolic panel with GFR  Obesity, diabetes, and hypertension syndrome (HCC) Assessment & Plan: She remains well controlled without medications by following a careful  diet per patient preference:   Metformin  recommended but deferred by patient. 0zempic discussed but declined. She is losing weight voluntarily and  has maintained an A1c < 6.0 She has a contraindication ACE/ARB .  She is taking a high potency statin   Lab Results  Component Value Date   HGBA1C 5.8 07/07/2024    Lab Results  Component Value Date   LABMICR 20.3 07/04/2022   MICROALBUR 1.4 07/07/2024   Lab Results  Component Value Date   CHOL 143 07/07/2024   HDL 44.10 07/07/2024   LDLCALC 55 07/07/2024   LDLDIRECT 48.0 07/10/2023   TRIG 219.0 (H) 07/07/2024   CHOLHDL 3 07/07/2024        Hyperlipidemia LDL goal <55 Assessment & Plan: She is at goal LDL of < 55 on crestor  high dose for known CAD and PAD.LFTs are normal.   Lab Results  Component Value Date   CHOL 143 07/07/2024   HDL 44.10 07/07/2024   LDLCALC 55 07/07/2024   LDLDIRECT 48.0 07/10/2023   TRIG 219.0 (H) 07/07/2024   CHOLHDL 3 07/07/2024   Lab Results  Component Value Date   ALT 7 07/07/2024   AST 16 07/07/2024   ALKPHOS 47 07/07/2024   BILITOT 1.1 07/07/2024      Coronary artery disease  involving native coronary artery of native heart without angina pectoris Assessment & Plan: She remains asymptomatic.   Continue aspirin 81 mg daily. Crestor  40 mg and carvedilol .     Lab Results  Component Value Date   CHOL 143 07/07/2024   HDL 44.10 07/07/2024   LDLCALC 55 07/07/2024   LDLDIRECT 48.0 07/10/2023   TRIG 219.0 (H) 07/07/2024   CHOLHDL 3 07/07/2024      Lumbar radiculitis Assessment & Plan: Secondary to DDD and scoliosis .  Her pain is managed with hydrocodone   and methocarbamol .  NSAIDS  C/i due to CKD   .  She has not had any ER visits  And has not requested any early refills.  Her Refill history was confirmed via [place] Controlled Substance database by me today during her visit and there have been no prescriptions of controlled substances filled from any providers other than me. .Her pain is better controlled on the current regimen and she is staying as active as her pain which now.  Hydrocodone   will be refilled for 3 months    Other orders -     HYDROcodone -Acetaminophen ; Take 1 tablet by mouth every 6 (six) hours as needed.  Dispense: 120 tablet; Refill: 0 -     HYDROcodone -Acetaminophen ; Take 1 tablet by mouth every 6 (six) hours as needed.  Dispense: 120 tablet; Refill: 0 -     HYDROcodone -Acetaminophen ; Take 1 tablet by mouth every 6 (six) hours as needed.  Dispense: 120 tablet; Refill: 0    Follow-up: Return in about 3 months (around 01/06/2025) for chronic pain management.   Verneita LITTIE Kettering, MD

## 2024-10-06 NOTE — Patient Instructions (Signed)
   I am so sorry for your loss  . Your sweet cat  was very blessed to have you for a mother , and I believe you will see her again!   May  you enjoy this time with family to reflect on God's blessings ,  and may He bless you with health and prosperity in the [place] Year!  Regards,   Verneita Kettering, MD

## 2024-10-08 ENCOUNTER — Encounter: Payer: Self-pay | Admitting: Internal Medicine

## 2024-10-08 ENCOUNTER — Ambulatory Visit: Payer: Self-pay | Admitting: Internal Medicine

## 2024-10-08 NOTE — Assessment & Plan Note (Signed)
 She remains well controlled without medications by following a careful  diet per patient preference:   Metformin  recommended but deferred by patient. 0zempic discussed but declined. She is losing weight voluntarily and  has maintained an A1c < 6.0 She has a contraindication ACE/ARB .  She is taking a high potency statin   Lab Results  Component Value Date   HGBA1C 5.8 07/07/2024    Lab Results  Component Value Date   LABMICR 20.3 07/04/2022   MICROALBUR 1.4 07/07/2024   Lab Results  Component Value Date   CHOL 143 07/07/2024   HDL 44.10 07/07/2024   LDLCALC 55 07/07/2024   LDLDIRECT 48.0 07/10/2023   TRIG 219.0 (H) 07/07/2024   CHOLHDL 3 07/07/2024

## 2024-10-08 NOTE — Assessment & Plan Note (Signed)
 She is at goal LDL of < 55 on crestor  high dose for known CAD and PAD.LFTs are normal.   Lab Results  Component Value Date   CHOL 143 07/07/2024   HDL 44.10 07/07/2024   LDLCALC 55 07/07/2024   LDLDIRECT 48.0 07/10/2023   TRIG 219.0 (H) 07/07/2024   CHOLHDL 3 07/07/2024   Lab Results  Component Value Date   ALT 7 07/07/2024   AST 16 07/07/2024   ALKPHOS 47 07/07/2024   BILITOT 1.1 07/07/2024

## 2024-10-08 NOTE — Assessment & Plan Note (Signed)
 She remains asymptomatic.   Continue aspirin 81 mg daily. Crestor  40 mg and carvedilol .     Lab Results  Component Value Date   CHOL 143 07/07/2024   HDL 44.10 07/07/2024   LDLCALC 55 07/07/2024   LDLDIRECT 48.0 07/10/2023   TRIG 219.0 (H) 07/07/2024   CHOLHDL 3 07/07/2024

## 2024-10-08 NOTE — Assessment & Plan Note (Signed)
 Secondary to DDD and scoliosis .  Her pain is managed with hydrocodone   and methocarbamol .  NSAIDS  C/i due to CKD   .  She has not had any ER visits  And has not requested any early refills.  Her Refill history was confirmed via [place] Controlled Substance database by me today during her visit and there have been no prescriptions of controlled substances filled from any providers other than me. .Her pain is better controlled on the current regimen and she is staying as active as her pain which now.  Hydrocodone   will be refilled for 3 months

## 2024-10-13 ENCOUNTER — Other Ambulatory Visit: Payer: Self-pay

## 2024-10-13 MED ORDER — CARVEDILOL 25 MG PO TABS: ORAL_TABLET | ORAL | 3 refills | Status: AC

## 2024-10-25 ENCOUNTER — Encounter: Payer: Self-pay | Admitting: Internal Medicine

## 2024-10-25 MED ORDER — PANTOPRAZOLE SODIUM 40 MG PO TBEC: DELAYED_RELEASE_TABLET | ORAL | 3 refills | Status: AC

## 2024-11-02 LAB — OPHTHALMOLOGY REPORT-SCANNED

## 2024-11-05 ENCOUNTER — Ambulatory Visit

## 2024-11-05 DIAGNOSIS — E538 Deficiency of other specified B group vitamins: Secondary | ICD-10-CM

## 2024-11-05 MED ORDER — CYANOCOBALAMIN 1000 MCG/ML IJ SOLN
1000.0000 ug | Freq: Once | INTRAMUSCULAR | Status: AC
  Administered 2024-11-05: 1000 ug via INTRAMUSCULAR

## 2024-11-05 NOTE — Progress Notes (Signed)
 Pt presented for their vitamin B12 injection. Pt was identified through two identifiers. Pt tolerated shot well in their left deltoid.

## 2024-11-24 ENCOUNTER — Ambulatory Visit

## 2024-12-07 ENCOUNTER — Ambulatory Visit

## 2024-12-07 DIAGNOSIS — E538 Deficiency of other specified B group vitamins: Secondary | ICD-10-CM

## 2024-12-07 MED ORDER — CYANOCOBALAMIN 1000 MCG/ML IJ SOLN
1000.0000 ug | Freq: Once | INTRAMUSCULAR | Status: AC
  Administered 2024-12-07: 1000 ug via INTRAMUSCULAR

## 2024-12-07 NOTE — Progress Notes (Signed)
 Pt received B12 injection in Left  deltoid muscle. Pt tolerated it well with no complaints or concerns.

## 2024-12-14 ENCOUNTER — Encounter: Payer: Self-pay | Admitting: Internal Medicine

## 2024-12-14 MED ORDER — HYDROCODONE-ACETAMINOPHEN 10-325 MG PO TABS: 1.0000 | ORAL_TABLET | Freq: Four times a day (QID) | ORAL | 0 refills | Status: AC | PRN

## 2024-12-14 NOTE — Telephone Encounter (Signed)
 I have added pharmacy to pt's chart.

## 2025-01-06 ENCOUNTER — Ambulatory Visit: Admitting: Internal Medicine

## 2025-01-07 ENCOUNTER — Ambulatory Visit
